# Patient Record
Sex: Female | Born: 1960
Health system: Southern US, Community
[De-identification: ages and names within clinical notes are randomized; demographics above are authoritative.]

## PROBLEM LIST (undated history)

## (undated) DIAGNOSIS — K589 Irritable bowel syndrome without diarrhea: Secondary | ICD-10-CM

## (undated) DIAGNOSIS — M797 Fibromyalgia: Secondary | ICD-10-CM

## (undated) DIAGNOSIS — IMO0002 Reserved for concepts with insufficient information to code with codable children: Secondary | ICD-10-CM

## (undated) DIAGNOSIS — I10 Essential (primary) hypertension: Secondary | ICD-10-CM

## (undated) DIAGNOSIS — M459 Ankylosing spondylitis of unspecified sites in spine: Secondary | ICD-10-CM

## (undated) HISTORY — PX: ABDOMINAL HYSTERECTOMY: SHX81

## (undated) HISTORY — PX: TONSILLECTOMY: SHX5217

## (undated) HISTORY — PX: KNEE SURGERY: SHX244

---

## 2000-10-03 ENCOUNTER — Other Ambulatory Visit: Admission: RE | Admit: 2000-10-03 | Discharge: 2000-10-03 | Payer: Self-pay | Admitting: Family Medicine

## 2000-11-06 ENCOUNTER — Encounter: Payer: Self-pay | Admitting: Family Medicine

## 2000-11-06 ENCOUNTER — Encounter: Admission: RE | Admit: 2000-11-06 | Discharge: 2000-11-06 | Payer: Self-pay | Admitting: Family Medicine

## 2001-04-23 ENCOUNTER — Encounter: Admission: RE | Admit: 2001-04-23 | Discharge: 2001-05-05 | Payer: Self-pay | Admitting: *Deleted

## 2001-10-05 ENCOUNTER — Encounter: Payer: Self-pay | Admitting: Orthopedic Surgery

## 2001-10-05 ENCOUNTER — Encounter: Admission: RE | Admit: 2001-10-05 | Discharge: 2001-10-05 | Payer: Self-pay | Admitting: Orthopedic Surgery

## 2002-04-20 ENCOUNTER — Ambulatory Visit (HOSPITAL_COMMUNITY): Admission: RE | Admit: 2002-04-20 | Discharge: 2002-04-20 | Payer: Self-pay | Admitting: Family Medicine

## 2002-04-20 ENCOUNTER — Encounter: Payer: Self-pay | Admitting: Family Medicine

## 2002-09-27 ENCOUNTER — Encounter: Payer: Self-pay | Admitting: Orthopedic Surgery

## 2002-09-27 ENCOUNTER — Encounter: Admission: RE | Admit: 2002-09-27 | Discharge: 2002-09-27 | Payer: Self-pay | Admitting: Orthopedic Surgery

## 2003-08-03 ENCOUNTER — Encounter
Admission: RE | Admit: 2003-08-03 | Discharge: 2003-11-01 | Payer: Self-pay | Admitting: Physical Medicine and Rehabilitation

## 2003-08-09 ENCOUNTER — Encounter: Admission: RE | Admit: 2003-08-09 | Discharge: 2003-11-07 | Payer: Self-pay

## 2003-09-17 ENCOUNTER — Emergency Department (HOSPITAL_COMMUNITY): Admission: EM | Admit: 2003-09-17 | Discharge: 2003-09-17 | Payer: Self-pay | Admitting: Emergency Medicine

## 2003-12-26 ENCOUNTER — Emergency Department (HOSPITAL_COMMUNITY): Admission: EM | Admit: 2003-12-26 | Discharge: 2003-12-26 | Payer: Self-pay | Admitting: Emergency Medicine

## 2004-01-11 ENCOUNTER — Other Ambulatory Visit: Admission: RE | Admit: 2004-01-11 | Discharge: 2004-01-11 | Payer: Self-pay | Admitting: Family Medicine

## 2004-01-13 ENCOUNTER — Encounter: Admission: RE | Admit: 2004-01-13 | Discharge: 2004-01-13 | Payer: Self-pay | Admitting: Family Medicine

## 2004-01-24 ENCOUNTER — Encounter: Admission: RE | Admit: 2004-01-24 | Discharge: 2004-01-24 | Payer: Self-pay | Admitting: Family Medicine

## 2004-06-02 ENCOUNTER — Emergency Department (HOSPITAL_COMMUNITY): Admission: EM | Admit: 2004-06-02 | Discharge: 2004-06-02 | Payer: Self-pay | Admitting: *Deleted

## 2004-06-14 ENCOUNTER — Encounter (INDEPENDENT_AMBULATORY_CARE_PROVIDER_SITE_OTHER): Payer: Self-pay | Admitting: Specialist

## 2004-06-14 ENCOUNTER — Ambulatory Visit (HOSPITAL_COMMUNITY): Admission: RE | Admit: 2004-06-14 | Discharge: 2004-06-14 | Payer: Self-pay | Admitting: *Deleted

## 2004-08-10 ENCOUNTER — Encounter: Admission: RE | Admit: 2004-08-10 | Discharge: 2004-08-10 | Payer: Self-pay | Admitting: Family Medicine

## 2004-08-29 ENCOUNTER — Encounter: Admission: RE | Admit: 2004-08-29 | Discharge: 2004-08-29 | Payer: Self-pay | Admitting: Family Medicine

## 2004-09-14 ENCOUNTER — Emergency Department (HOSPITAL_COMMUNITY): Admission: EM | Admit: 2004-09-14 | Discharge: 2004-09-14 | Payer: Self-pay | Admitting: Emergency Medicine

## 2005-03-08 ENCOUNTER — Encounter: Admission: RE | Admit: 2005-03-08 | Discharge: 2005-03-08 | Payer: Self-pay | Admitting: Family Medicine

## 2005-08-21 ENCOUNTER — Ambulatory Visit (HOSPITAL_BASED_OUTPATIENT_CLINIC_OR_DEPARTMENT_OTHER): Admission: RE | Admit: 2005-08-21 | Discharge: 2005-08-21 | Payer: Self-pay | Admitting: Orthopedic Surgery

## 2005-10-02 ENCOUNTER — Ambulatory Visit (HOSPITAL_BASED_OUTPATIENT_CLINIC_OR_DEPARTMENT_OTHER): Admission: RE | Admit: 2005-10-02 | Discharge: 2005-10-02 | Payer: Self-pay | Admitting: Orthopedic Surgery

## 2005-10-07 ENCOUNTER — Encounter: Admission: RE | Admit: 2005-10-07 | Discharge: 2005-10-07 | Payer: Self-pay | Admitting: Family Medicine

## 2006-02-11 ENCOUNTER — Encounter: Admission: RE | Admit: 2006-02-11 | Discharge: 2006-02-11 | Payer: Self-pay | Admitting: Family Medicine

## 2006-02-18 ENCOUNTER — Other Ambulatory Visit: Admission: RE | Admit: 2006-02-18 | Discharge: 2006-02-18 | Payer: Self-pay | Admitting: Family Medicine

## 2007-05-06 ENCOUNTER — Encounter: Admission: RE | Admit: 2007-05-06 | Discharge: 2007-05-06 | Payer: Self-pay | Admitting: Family Medicine

## 2007-05-19 ENCOUNTER — Emergency Department (HOSPITAL_COMMUNITY): Admission: EM | Admit: 2007-05-19 | Discharge: 2007-05-19 | Payer: Self-pay | Admitting: Emergency Medicine

## 2007-07-04 ENCOUNTER — Encounter: Admission: RE | Admit: 2007-07-04 | Discharge: 2007-07-04 | Payer: Self-pay | Admitting: Family Medicine

## 2008-01-25 ENCOUNTER — Emergency Department (HOSPITAL_COMMUNITY): Admission: EM | Admit: 2008-01-25 | Discharge: 2008-01-25 | Payer: Self-pay | Admitting: Emergency Medicine

## 2008-09-07 ENCOUNTER — Emergency Department (HOSPITAL_COMMUNITY): Admission: EM | Admit: 2008-09-07 | Discharge: 2008-09-08 | Payer: Self-pay | Admitting: Emergency Medicine

## 2010-02-01 ENCOUNTER — Inpatient Hospital Stay (HOSPITAL_COMMUNITY): Admission: AD | Admit: 2010-02-01 | Discharge: 2010-02-03 | Payer: Self-pay | Admitting: Internal Medicine

## 2010-07-07 ENCOUNTER — Encounter: Payer: Self-pay | Admitting: Family Medicine

## 2010-07-08 ENCOUNTER — Encounter: Payer: Self-pay | Admitting: Family Medicine

## 2010-08-18 ENCOUNTER — Emergency Department (HOSPITAL_COMMUNITY): Payer: Medicare Other

## 2010-08-18 ENCOUNTER — Emergency Department (HOSPITAL_COMMUNITY)
Admission: EM | Admit: 2010-08-18 | Discharge: 2010-08-18 | Disposition: A | Discharge status: Home / Self Care | Payer: Medicare Other | Attending: Emergency Medicine | Admitting: Emergency Medicine

## 2010-08-18 DIAGNOSIS — IMO0001 Reserved for inherently not codable concepts without codable children: Secondary | ICD-10-CM | POA: Insufficient documentation

## 2010-08-18 DIAGNOSIS — M545 Low back pain, unspecified: Secondary | ICD-10-CM | POA: Insufficient documentation

## 2010-08-18 DIAGNOSIS — Z79899 Other long term (current) drug therapy: Secondary | ICD-10-CM | POA: Insufficient documentation

## 2010-08-18 DIAGNOSIS — M79609 Pain in unspecified limb: Secondary | ICD-10-CM | POA: Insufficient documentation

## 2010-08-18 DIAGNOSIS — F411 Generalized anxiety disorder: Secondary | ICD-10-CM | POA: Insufficient documentation

## 2010-08-18 DIAGNOSIS — M533 Sacrococcygeal disorders, not elsewhere classified: Secondary | ICD-10-CM | POA: Insufficient documentation

## 2010-08-18 LAB — URINE MICROSCOPIC-ADD ON

## 2010-08-18 LAB — URINALYSIS, ROUTINE W REFLEX MICROSCOPIC
Bilirubin Urine: NEGATIVE
Ketones, ur: 15 mg/dL — AB
Protein, ur: NEGATIVE mg/dL
Specific Gravity, Urine: 1.025 (ref 1.005–1.030)
Urobilinogen, UA: 0.2 mg/dL (ref 0.0–1.0)

## 2010-08-22 ENCOUNTER — Other Ambulatory Visit: Payer: Self-pay | Admitting: Family Medicine

## 2010-08-22 DIAGNOSIS — M545 Low back pain: Secondary | ICD-10-CM

## 2010-08-23 ENCOUNTER — Ambulatory Visit
Admission: RE | Admit: 2010-08-23 | Discharge: 2010-08-23 | Disposition: A | Discharge status: Home / Self Care | Payer: Medicare Other | Source: Ambulatory Visit | Attending: Family Medicine | Admitting: Family Medicine

## 2010-08-23 DIAGNOSIS — M545 Low back pain: Secondary | ICD-10-CM

## 2010-08-31 LAB — CULTURE, BLOOD (ROUTINE X 2)
Culture: NO GROWTH
Culture: NO GROWTH

## 2010-08-31 LAB — URINALYSIS, ROUTINE W REFLEX MICROSCOPIC
Glucose, UA: NEGATIVE mg/dL
Ketones, ur: NEGATIVE mg/dL
Protein, ur: 30 mg/dL — AB
Urobilinogen, UA: 1 mg/dL (ref 0.0–1.0)

## 2010-08-31 LAB — DIFFERENTIAL
Eosinophils Absolute: 0 10*3/uL (ref 0.0–0.7)
Lymphocytes Relative: 19 % (ref 12–46)
Lymphs Abs: 2.7 10*3/uL (ref 0.7–4.0)
Neutro Abs: 10.8 10*3/uL — ABNORMAL HIGH (ref 1.7–7.7)
Neutrophils Relative %: 75 % (ref 43–77)

## 2010-08-31 LAB — COMPREHENSIVE METABOLIC PANEL
ALT: 27 U/L (ref 0–35)
BUN: 10 mg/dL (ref 6–23)
CO2: 28 mEq/L (ref 19–32)
Calcium: 8.5 mg/dL (ref 8.4–10.5)
Creatinine, Ser: 0.62 mg/dL (ref 0.4–1.2)
GFR calc non Af Amer: >60 mL/min (ref >60)
Glucose, Bld: 153 mg/dL — ABNORMAL HIGH (ref 70–99)

## 2010-08-31 LAB — URINE CULTURE
Culture  Setup Time: 201108190209
Special Requests: NEGATIVE

## 2010-08-31 LAB — CBC
HCT: 37.7 % (ref 36.0–46.0)
Hemoglobin: 13.3 g/dL (ref 12.0–15.0)
MCH: 32.1 pg (ref 26.0–34.0)
MCH: 32.4 pg (ref 26.0–34.0)
MCHC: 34.6 g/dL (ref 30.0–36.0)
MCHC: 35.3 g/dL (ref 30.0–36.0)
MCV: 91.7 fL (ref 78.0–100.0)
Platelets: 423 10*3/uL — ABNORMAL HIGH (ref 150–400)
RBC: 3.6 MIL/uL — ABNORMAL LOW (ref 3.87–5.11)
RDW: 13.4 % (ref 11.5–15.5)

## 2010-08-31 LAB — URINE MICROSCOPIC-ADD ON

## 2010-08-31 LAB — HEMOGLOBIN A1C
Hgb A1c MFr Bld: 5.5 % (ref <5.7)
Mean Plasma Glucose: 111 mg/dL (ref <117)

## 2010-08-31 LAB — MAGNESIUM: Magnesium: 2.2 mg/dL (ref 1.5–2.5)

## 2010-08-31 LAB — PROTIME-INR: Prothrombin Time: 13.7 seconds (ref 11.6–15.2)

## 2010-09-07 ENCOUNTER — Other Ambulatory Visit: Payer: Self-pay | Admitting: Neurosurgery

## 2010-09-07 DIAGNOSIS — M47816 Spondylosis without myelopathy or radiculopathy, lumbar region: Secondary | ICD-10-CM

## 2010-09-10 ENCOUNTER — Other Ambulatory Visit: Payer: Self-pay | Admitting: Neurosurgery

## 2010-09-10 ENCOUNTER — Ambulatory Visit
Admission: RE | Admit: 2010-09-10 | Discharge: 2010-09-10 | Disposition: A | Discharge status: Home / Self Care | Payer: Medicare Other | Source: Ambulatory Visit | Attending: Neurosurgery | Admitting: Neurosurgery

## 2010-09-10 DIAGNOSIS — M47816 Spondylosis without myelopathy or radiculopathy, lumbar region: Secondary | ICD-10-CM

## 2010-09-22 ENCOUNTER — Emergency Department (HOSPITAL_COMMUNITY)
Admission: EM | Admit: 2010-09-22 | Discharge: 2010-09-22 | Disposition: A | Discharge status: Home / Self Care | Payer: Medicare Other | Attending: Emergency Medicine | Admitting: Emergency Medicine

## 2010-09-22 DIAGNOSIS — F411 Generalized anxiety disorder: Secondary | ICD-10-CM | POA: Insufficient documentation

## 2010-09-22 DIAGNOSIS — Z87442 Personal history of urinary calculi: Secondary | ICD-10-CM | POA: Insufficient documentation

## 2010-09-22 DIAGNOSIS — R300 Dysuria: Secondary | ICD-10-CM | POA: Insufficient documentation

## 2010-09-22 DIAGNOSIS — R509 Fever, unspecified: Secondary | ICD-10-CM | POA: Insufficient documentation

## 2010-09-22 DIAGNOSIS — R112 Nausea with vomiting, unspecified: Secondary | ICD-10-CM | POA: Insufficient documentation

## 2010-09-22 DIAGNOSIS — IMO0001 Reserved for inherently not codable concepts without codable children: Secondary | ICD-10-CM | POA: Insufficient documentation

## 2010-09-22 DIAGNOSIS — N12 Tubulo-interstitial nephritis, not specified as acute or chronic: Secondary | ICD-10-CM | POA: Insufficient documentation

## 2010-09-22 DIAGNOSIS — R109 Unspecified abdominal pain: Secondary | ICD-10-CM | POA: Insufficient documentation

## 2010-09-22 LAB — URINALYSIS, ROUTINE W REFLEX MICROSCOPIC
Nitrite: POSITIVE — AB
Protein, ur: 100 mg/dL — AB

## 2010-09-22 LAB — CBC
HCT: 41.9 % (ref 36.0–46.0)
Hemoglobin: 14.1 g/dL (ref 12.0–15.0)
MCH: 30.8 pg (ref 26.0–34.0)
MCHC: 33.7 g/dL (ref 30.0–36.0)

## 2010-09-22 LAB — COMPREHENSIVE METABOLIC PANEL
ALT: 17 U/L (ref 0–35)
CO2: 29 mEq/L (ref 19–32)
Calcium: 9.5 mg/dL (ref 8.4–10.5)
Creatinine, Ser: 0.89 mg/dL (ref 0.4–1.2)
GFR calc non Af Amer: >60 mL/min (ref >60)
Glucose, Bld: 117 mg/dL — ABNORMAL HIGH (ref 70–99)
Sodium: 137 mEq/L (ref 135–145)

## 2010-09-22 LAB — DIFFERENTIAL
Lymphocytes Relative: 12 % (ref 12–46)
Monocytes Absolute: 1.6 10*3/uL — ABNORMAL HIGH (ref 0.1–1.0)
Monocytes Relative: 10 % (ref 3–12)
Neutro Abs: 12.4 10*3/uL — ABNORMAL HIGH (ref 1.7–7.7)

## 2010-09-24 LAB — URINE CULTURE: Colony Count: >=100000

## 2010-09-27 LAB — DIFFERENTIAL
Basophils Relative: 0 % (ref 0–1)
Eosinophils Absolute: 0 10*3/uL (ref 0.0–0.7)
Monocytes Absolute: 0.3 10*3/uL (ref 0.1–1.0)
Monocytes Relative: 2 % — ABNORMAL LOW (ref 3–12)
Neutrophils Relative %: 88 % — ABNORMAL HIGH (ref 43–77)

## 2010-09-27 LAB — URINALYSIS, ROUTINE W REFLEX MICROSCOPIC
Bilirubin Urine: NEGATIVE
Glucose, UA: NEGATIVE mg/dL
Ketones, ur: 15 mg/dL — AB
Protein, ur: 30 mg/dL — AB

## 2010-09-27 LAB — COMPREHENSIVE METABOLIC PANEL
ALT: 15 U/L (ref 0–35)
Alkaline Phosphatase: 70 U/L (ref 39–117)
Glucose, Bld: 147 mg/dL — ABNORMAL HIGH (ref 70–99)
Potassium: 3.5 mEq/L (ref 3.5–5.1)
Sodium: 138 mEq/L (ref 135–145)
Total Protein: 7.4 g/dL (ref 6.0–8.3)

## 2010-09-27 LAB — CBC
Hemoglobin: 15.2 g/dL — ABNORMAL HIGH (ref 12.0–15.0)
RBC: 4.8 MIL/uL (ref 3.87–5.11)
RDW: 12.7 % (ref 11.5–15.5)
WBC: 13.4 10*3/uL — ABNORMAL HIGH (ref 4.0–10.5)

## 2010-09-27 LAB — URINE MICROSCOPIC-ADD ON

## 2010-11-02 NOTE — Op Note (Signed)
Belinda Calderon, Belinda Calderon               ACCOUNT NO.:  [id number]   MEDICAL RECORD NO.:  [id number]          PATIENT TYPE:  AMB   LOCATION:  DSC                          FACILITY:  [facility]   PHYSICIAN:  Cindee Salt, M.D.       DATE OF BIRTH:  [birth year]   DATE OF PROCEDURE:  10/02/2005  DATE OF DISCHARGE:                                 OPERATIVE REPORT   PREOPERATIVE DIAGNOSIS:  Carpal tunnel syndrome, right hand.   POSTOPERATIVE DIAGNOSIS:  Carpal tunnel syndrome, right hand.   OPERATION:  Decompression right median nerve.   SURGEON:  Cindee Salt, M.D.   ASSISTANT:  Carolyne Fiscal R.N.   ANESTHESIA:  General.   HISTORY:  The patient is a 50 year old female with a history of carpal  tunnel syndrome, EMG nerve conductions positive, which has not responded to  conservative treatment.  She is desirous for release having undergone  release on the left side.  She is now admitted for release on the right. The  area was marked in the preoperative area by the patient and surgeon.  Questions answered.   PROCEDURE:  The patient is brought to the operating room where a general  anesthetic was carried out without difficulty.  She was prepped using  DuraPrep, supine position, right arm free.  A longitudinal incision was made  in the palm and carried down through subcutaneous tissue.  Bleeders were  electrocauterized.  The palmar fascia was split, superficial palmar arch  identified. The flexor tendon to the ring and little finger were identified.  To the ulnar side of the median nerve, the carpal retinaculum was incised  with sharp dissection.  Right angle and Sewall retractor placed between the  skin and forearm fascia.  The fascia was released for approximately 1.5 cm  proximal to the wrist crease under direct vision.  The canal was explored.  No further lesions were identified.  The area of compression of the nerve  was apparent.  Tenosynovium tissue was moderately thickened.  The wound was  irrigated.   The skin was closed with interrupted 5-0 nylon sutures.  Sterile  compressive dressing and splint was applied.  The patient tolerated the  procedure well was taken to the recovery room for observation in  satisfactory condition.  She is discharged home to return to the [facility]  in [place] in one week on Vicodin.           ______________________________  Cindee Salt, M.D.     GK/MEDQ  D:  10/02/2005  T:  10/02/2005  Job:  [id number]

## 2010-11-02 NOTE — Group Therapy Note (Signed)
CHIEF COMPLAINT:  I have fibromyalgia and ankylosing spondylitis.   HISTORY OF PRESENT ILLNESS:  This is a 50 year old woman who has a long  history of multiple pain complaints, who has been diagnosed with  fibromyalgia.  She has recently been worked up by rheumatologist, Dr. Pollyann Savoy, and she was found to be positive for HLAB-27, however, an MRI of  her pelvis showed no sclerosis, erosions or subchondral edema.  She has also  had some blood work and shows she has elevated triglycerides and thyroid  functions are normal.  CBC is essentially normal.  She has had multiple  injections into her hips and back and elbow area and has received various  medications for treatment of her fibromyalgia including Neurontin, Zoloft,  Ultram, Paxil, Lorcet, Toradol, Voltaren and Effexor.   She has also had some left elbow x-rays done, May 2002, which showed no  joint space narrowing.  She has had an AP x-ray of her pelvis, September  2001; again, that showed no joint space narrowing.  She had an MRI of her  right knee which showed a lateral meniscal tear.   She is currently followed at mental health by Dr. Mila Homer and she last saw her  back in January.   She basically has pain, especially at her elbows, her mid-back, low-back  area and legs.  Essentially, all sorts of activities aggravate her pain,  including walking, bending, sitting, working; therapy in the past  exacerbated her pain.  Her pain is typically improved with heat and  medication.  She describes her pain; her average pain is about an 8 on a  scale of 10, the least pain she has is about a 4 and the worst is about a 10  on a scale of 10.   FUNCTIONAL STATUS:  She is currently independent with self-care and  mobility.   REVIEW OF SYSTEMS:  Review of systems is positive for numbness and spasms,  nausea, vomiting, diarrhea, constipation, poor appetite.  She reports  fevers, chills, weight loss and excessive sweating and  bruising.   SOCIAL HISTORY:  The patient is married.  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  She also enjoys Visual merchandiser.  She has  filed for disability.  She occasionally drinks alcohol, denies tobacco or  illicit drug use.  She is currently home-schooling one of her children, who  is 50.  She has older children, 25 and a 34 year old.   FAMILY HISTORY:  Family history is remarkable for mother at age 14 with  diabetes, hypertension, rheumatoid arthritis and osteoporosis; father age 94  with diabetes, hypertension.  She has siblings who have rheumatoid arthritis  as well as multiple sclerosis.   ALLERGIES:  None that she knows of.   CURRENT MEDICATIONS:  1. Prozac 40 mg daily.  2. Enalapril/hydrochlorothiazide 10/25 mg 1 daily.  3. Clonazepam 1 mg p.r.n.  4. Wellbutrin 300 mg daily.  5. Hydrocodone 7.5 mg 2 q.6 h.   PHYSICAL EXAMINATION:  GENERAL:  On exam, she was noted to be a well-  developed, well-nourished woman in no apparent distress.  She was  appropriate and cooperative.  SKIN:  Unremarkable.  HEENT:  Unremarkable.  NECK:  Neck was supple without any lymphadenopathy.  HEART:  Heart was regular rhythm.  LUNGS:  Lungs were clear.  ABDOMEN:  Abdomen was benign.  NEUROMUSCULAR:  Her gait in the room was somewhat slow, not obviously  antalgic.  She was able to flex forward approximately 40 degrees.  Extension  was 10.  Lateral flexion was 10 degrees bilaterally.  Seated, reflexes were  2+ in the biceps, triceps, brachioradialis; 2+ at the patellar tendons and  Achilles tendons bilaterally.  Toes are downgoing.  No clonus was noted.  Sensation was intact in the upper and lower extremities without any obvious  diminishment in any of the dermatomes.  Motor strength was 5/5 in upper and  lower extremities.   Tenderness to palpation in multiple areas interscapularly and in the lower  lumbar area as well as exquisitely at both trochanters.    IMPRESSION:  1. Fibromyalgia.  2. Positive HLAB-27.   PLAN:  The patient was given a prescription for Mobic 7.5 mg 1 p.o. daily.  She was also given Lidoderm 5% patch to apply to affected areas, 12 hours  on, 12 hours off, #30, and a prescription was written for physical therapy 2  times a week for 4 weeks for them to educate the patient in proper body  mechanics, spine stabilization, especially extension exercises and home  program.  We would like her to be able to do a good independent home  program, maybe transition her to a [facility] for further therapy.  We will see  her back in 1 month.  We will try to avoid opioids with her and emphasize  importance of activity.     Brantley Stage, M.D.   DMK/MedQ  D:  08/05/2003 17:43:57  T:  08/06/2003 05:22:12  Job #:  [id number]   cc:   Stacie Acres. White, M.D.  [address]  [place]  [place] [address]  Fax: [phone]

## 2010-11-02 NOTE — Op Note (Signed)
Belinda Calderon, Belinda Calderon               ACCOUNT NO.:  [id number]   MEDICAL RECORD NO.:  [id number]          PATIENT TYPE:  AMB   LOCATION:  DSC                          FACILITY:  [facility]   PHYSICIAN:  Cindee Salt, M.D.       DATE OF BIRTH:  [birth year]   DATE OF PROCEDURE:  08/21/2005  DATE OF DISCHARGE:                                 OPERATIVE REPORT   PREOPERATIVE DIAGNOSIS:  Carpal tunnel syndrome left hand.   POSTOPERATIVE DIAGNOSIS:  Carpal tunnel syndrome left hand.   OPERATION:  Decompression left median nerve.   SURGEON:  Cindee Salt, M.D.   ANESTHESIA:  General.   HISTORY:  The patient is a 50 year old female with history of carpal tunnel  syndrome.  EMG nerve conductions are positive which has not responded to  conservative treatment.   PROCEDURE:  The patient is brought to the operating room where a general  anesthetic was carried out without difficulty.  She was prepped using  DuraPrep, supine position with left arm free.  A longitudinal incision was  made in the palm and carried down through the subcutaneous tissue.  Bleeders  were electrocauterized.  The palmar fascia was split, superficial palmar  arch identified, the flexor tendon of the ring and little finger identified.  To the ulnar side of median nerve, the carpal retinaculum was incised with  sharp dissection.  A right angle and Sewall retractor placed between skin  and forearm fascia.  The fascia was then released under direct vision for  approximately 1.5 proximal to the wrist crease.  The area of compression to  the nerve was apparent.  No further lesions were identified.  The wound was  irrigated.  The skin was closed with interrupted 5-0 nylon sutures.  A  sterile compressive dressing and splint was applied.  The patient tolerated  the procedure well was taken to the recovery observation in satisfactory  condition.  She is discharged home to return to the [facility]  [place] in one week on  Vicodin.           ______________________________  Cindee Salt, M.D.     GK/MEDQ  D:  08/21/2005  T:  08/21/2005  Job:  [id number]

## 2010-12-14 ENCOUNTER — Emergency Department (HOSPITAL_COMMUNITY)
Admission: EM | Admit: 2010-12-14 | Discharge: 2010-12-14 | Disposition: A | Discharge status: Home / Self Care | Payer: Medicare Other | Attending: Emergency Medicine | Admitting: Emergency Medicine

## 2010-12-14 ENCOUNTER — Emergency Department (HOSPITAL_COMMUNITY): Payer: Medicare Other

## 2010-12-14 DIAGNOSIS — M79609 Pain in unspecified limb: Secondary | ICD-10-CM | POA: Insufficient documentation

## 2010-12-14 DIAGNOSIS — R079 Chest pain, unspecified: Secondary | ICD-10-CM | POA: Insufficient documentation

## 2010-12-14 DIAGNOSIS — IMO0001 Reserved for inherently not codable concepts without codable children: Secondary | ICD-10-CM | POA: Insufficient documentation

## 2010-12-14 DIAGNOSIS — M545 Low back pain, unspecified: Secondary | ICD-10-CM | POA: Insufficient documentation

## 2010-12-14 DIAGNOSIS — K219 Gastro-esophageal reflux disease without esophagitis: Secondary | ICD-10-CM | POA: Insufficient documentation

## 2010-12-14 LAB — CBC
MCH: 31.3 pg (ref 26.0–34.0)
MCHC: 35.4 g/dL (ref 30.0–36.0)
Platelets: 263 10*3/uL (ref 150–400)
RDW: 12.6 % (ref 11.5–15.5)

## 2010-12-14 LAB — BASIC METABOLIC PANEL
Chloride: 106 mEq/L (ref 96–112)
Creatinine, Ser: 0.57 mg/dL (ref 0.50–1.10)
GFR calc Af Amer: >60 mL/min (ref >60)
GFR calc non Af Amer: >60 mL/min (ref >60)
Potassium: 3.3 mEq/L — ABNORMAL LOW (ref 3.5–5.1)

## 2010-12-14 LAB — CK TOTAL AND CKMB (NOT AT ARMC)
CK, MB: 1.8 ng/mL (ref 0.3–4.0)
Relative Index: INVALID (ref 0.0–2.5)
Relative Index: INVALID (ref 0.0–2.5)
Total CK: 69 U/L (ref 7–177)

## 2010-12-14 LAB — URINALYSIS, ROUTINE W REFLEX MICROSCOPIC
Ketones, ur: NEGATIVE mg/dL
Leukocytes, UA: NEGATIVE
Nitrite: NEGATIVE
Protein, ur: NEGATIVE mg/dL

## 2010-12-14 LAB — DIFFERENTIAL
Basophils Relative: 0 % (ref 0–1)
Eosinophils Absolute: 0.1 10*3/uL (ref 0.0–0.7)
Eosinophils Relative: 0 % (ref 0–5)
Monocytes Absolute: 0.6 10*3/uL (ref 0.1–1.0)
Monocytes Relative: 4 % (ref 3–12)
Neutrophils Relative %: 80 % — ABNORMAL HIGH (ref 43–77)

## 2010-12-14 LAB — TROPONIN I
Troponin I: <0.3 ng/mL (ref <0.30)
Troponin I: <0.3 ng/mL (ref <0.30)

## 2011-03-15 LAB — POCT I-STAT, CHEM 8
BUN: 28 — ABNORMAL HIGH
Calcium, Ion: 1.1 — ABNORMAL LOW
HCT: 54 — ABNORMAL HIGH
Hemoglobin: 18.4 — ABNORMAL HIGH
Sodium: 137
TCO2: 22

## 2011-03-15 LAB — CLOSTRIDIUM DIFFICILE EIA

## 2011-03-15 LAB — CBC
HCT: 52.6 — ABNORMAL HIGH
MCHC: 34.1
Platelets: 346
RDW: 12.9

## 2011-03-15 LAB — OVA AND PARASITE EXAMINATION

## 2011-03-15 LAB — DIFFERENTIAL
Basophils Absolute: 0
Basophils Relative: 0
Eosinophils Absolute: 0
Eosinophils Relative: 0
Lymphocytes Relative: 15
Monocytes Absolute: 1

## 2011-03-15 LAB — STOOL CULTURE

## 2011-03-19 ENCOUNTER — Other Ambulatory Visit: Payer: Self-pay | Admitting: Family Medicine

## 2011-03-19 DIAGNOSIS — Z1231 Encounter for screening mammogram for malignant neoplasm of breast: Secondary | ICD-10-CM

## 2011-03-25 LAB — POCT I-STAT CREATININE
Creatinine, Ser: 0.8
Operator id: 234501

## 2011-03-25 LAB — I-STAT 8, (EC8 V) (CONVERTED LAB)
Acid-base deficit: 1
Bicarbonate: 26 — ABNORMAL HIGH
Chloride: 106
pCO2, Ven: 50.7 — ABNORMAL HIGH
pH, Ven: 7.318 — ABNORMAL HIGH

## 2011-03-25 LAB — ETHANOL: Alcohol, Ethyl (B): <5

## 2011-03-26 ENCOUNTER — Ambulatory Visit: Payer: Medicare Other

## 2011-04-04 ENCOUNTER — Ambulatory Visit: Payer: Medicare Other

## 2011-04-05 ENCOUNTER — Other Ambulatory Visit: Payer: Self-pay | Admitting: Neurosurgery

## 2011-04-05 DIAGNOSIS — M47816 Spondylosis without myelopathy or radiculopathy, lumbar region: Secondary | ICD-10-CM

## 2011-04-15 ENCOUNTER — Other Ambulatory Visit: Payer: Self-pay | Admitting: Neurosurgery

## 2011-04-15 ENCOUNTER — Ambulatory Visit
Admission: RE | Admit: 2011-04-15 | Discharge: 2011-04-15 | Disposition: A | Discharge status: Home / Self Care | Payer: Medicare Other | Source: Ambulatory Visit | Attending: Neurosurgery | Admitting: Neurosurgery

## 2011-04-15 DIAGNOSIS — M47816 Spondylosis without myelopathy or radiculopathy, lumbar region: Secondary | ICD-10-CM

## 2011-04-15 MED ORDER — METHYLPREDNISOLONE ACETATE 40 MG/ML INJ SUSP (RADIOLOG
120.0000 mg | Freq: Once | INTRAMUSCULAR | Status: AC
  Administered 2011-04-15: 120 mg via INTRA_ARTICULAR

## 2011-04-15 MED ORDER — IOHEXOL 180 MG/ML  SOLN
1.0000 mL | Freq: Once | INTRAMUSCULAR | Status: AC | PRN
  Administered 2011-04-15: 1 mL via INTRA_ARTICULAR

## 2011-04-15 MED ORDER — DIAZEPAM 5 MG PO TABS
5.0000 mg | ORAL_TABLET | Freq: Once | ORAL | Status: AC
  Administered 2011-04-15: 5 mg via ORAL

## 2011-09-09 ENCOUNTER — Encounter (HOSPITAL_COMMUNITY): Payer: Self-pay | Admitting: Emergency Medicine

## 2011-09-09 ENCOUNTER — Emergency Department (HOSPITAL_COMMUNITY)
Admission: EM | Admit: 2011-09-09 | Discharge: 2011-09-09 | Disposition: A | Discharge status: Home Health | Payer: Medicare Other | Attending: Emergency Medicine | Admitting: Emergency Medicine

## 2011-09-09 DIAGNOSIS — L2989 Other pruritus: Secondary | ICD-10-CM | POA: Insufficient documentation

## 2011-09-09 DIAGNOSIS — Y92009 Unspecified place in unspecified non-institutional (private) residence as the place of occurrence of the external cause: Secondary | ICD-10-CM | POA: Insufficient documentation

## 2011-09-09 DIAGNOSIS — M459 Ankylosing spondylitis of unspecified sites in spine: Secondary | ICD-10-CM | POA: Insufficient documentation

## 2011-09-09 DIAGNOSIS — M1710 Unilateral primary osteoarthritis, unspecified knee: Secondary | ICD-10-CM | POA: Insufficient documentation

## 2011-09-09 DIAGNOSIS — T63391A Toxic effect of venom of other spider, accidental (unintentional), initial encounter: Secondary | ICD-10-CM | POA: Insufficient documentation

## 2011-09-09 DIAGNOSIS — I10 Essential (primary) hypertension: Secondary | ICD-10-CM | POA: Insufficient documentation

## 2011-09-09 DIAGNOSIS — M797 Fibromyalgia: Secondary | ICD-10-CM | POA: Insufficient documentation

## 2011-09-09 DIAGNOSIS — K589 Irritable bowel syndrome without diarrhea: Secondary | ICD-10-CM | POA: Insufficient documentation

## 2011-09-09 DIAGNOSIS — L298 Other pruritus: Secondary | ICD-10-CM | POA: Insufficient documentation

## 2011-09-09 DIAGNOSIS — T6391XA Toxic effect of contact with unspecified venomous animal, accidental (unintentional), initial encounter: Secondary | ICD-10-CM | POA: Insufficient documentation

## 2011-09-09 DIAGNOSIS — R21 Rash and other nonspecific skin eruption: Secondary | ICD-10-CM | POA: Insufficient documentation

## 2011-09-09 DIAGNOSIS — T63301A Toxic effect of unspecified spider venom, accidental (unintentional), initial encounter: Secondary | ICD-10-CM

## 2011-09-09 HISTORY — DX: Essential (primary) hypertension: I10

## 2011-09-09 HISTORY — DX: Ankylosing spondylitis of unspecified sites in spine: M45.9

## 2011-09-09 HISTORY — DX: Irritable bowel syndrome, unspecified: K58.9

## 2011-09-09 HISTORY — DX: Reserved for concepts with insufficient information to code with codable children: IMO0002

## 2011-09-09 HISTORY — DX: Fibromyalgia: M79.7

## 2011-09-09 LAB — CBC
HCT: 40.4 % (ref 36.0–46.0)
MCH: 31.1 pg (ref 26.0–34.0)
MCV: 91 fL (ref 78.0–100.0)
RDW: 13.3 % (ref 11.5–15.5)
WBC: 12.7 10*3/uL — ABNORMAL HIGH (ref 4.0–10.5)

## 2011-09-09 LAB — BASIC METABOLIC PANEL
CO2: 27 mEq/L (ref 19–32)
Calcium: 9.1 mg/dL (ref 8.4–10.5)
Creatinine, Ser: 0.75 mg/dL (ref 0.50–1.10)
Glucose, Bld: 114 mg/dL — ABNORMAL HIGH (ref 70–99)

## 2011-09-09 LAB — DIFFERENTIAL
Basophils Absolute: 0 10*3/uL (ref 0.0–0.1)
Eosinophils Relative: 8 % — ABNORMAL HIGH (ref 0–5)
Lymphocytes Relative: 21 % (ref 12–46)
Lymphs Abs: 2.7 10*3/uL (ref 0.7–4.0)
Monocytes Absolute: 0.9 10*3/uL (ref 0.1–1.0)

## 2011-09-09 MED ORDER — BENADRYL 25 MG PO TABS: 25.0000 mg | ORAL_TABLET | Freq: Four times a day (QID) | ORAL | Status: AC | PRN

## 2011-09-09 MED ORDER — CLINDAMYCIN PHOSPHATE 600 MG/50ML IV SOLN
600.0000 mg | Freq: Three times a day (TID) | INTRAVENOUS | Status: DC
  Administered 2011-09-09: 600 mg via INTRAVENOUS
  Filled 2011-09-09: qty 50

## 2011-09-09 MED ORDER — PREDNISONE 10 MG PO TABS: 20.0000 mg | ORAL_TABLET | Freq: Every day | ORAL | Status: DC

## 2011-09-09 MED ORDER — FAMOTIDINE IN NACL 20-0.9 MG/50ML-% IV SOLN: 20.0000 mg | INTRAVENOUS | Status: DC

## 2011-09-09 MED ORDER — DIPHENHYDRAMINE HCL 25 MG PO CAPS
50.0000 mg | ORAL_CAPSULE | Freq: Once | ORAL | Status: AC
  Administered 2011-09-09: 50 mg via ORAL
  Filled 2011-09-09: qty 2

## 2011-09-09 MED ORDER — FAMOTIDINE 20 MG PO TABS
20.0000 mg | ORAL_TABLET | Freq: Once | ORAL | Status: AC
  Administered 2011-09-09: 20 mg via ORAL
  Filled 2011-09-09: qty 1

## 2011-09-09 MED ORDER — PREDNISONE 20 MG PO TABS
60.0000 mg | ORAL_TABLET | Freq: Once | ORAL | Status: AC
  Administered 2011-09-09: 60 mg via ORAL
  Filled 2011-09-09: qty 3

## 2011-09-09 MED ORDER — CLINDAMYCIN HCL 150 MG PO CAPS: 300.0000 mg | ORAL_CAPSULE | Freq: Four times a day (QID) | ORAL | Status: AC

## 2011-09-09 MED ORDER — PEPCID 20 MG PO TABS: 20.0000 mg | ORAL_TABLET | Freq: Two times a day (BID) | ORAL | Status: AC

## 2011-09-09 MED ORDER — SODIUM CHLORIDE 0.9 % IV SOLN
1000.0000 mL | Freq: Once | INTRAVENOUS | Status: AC
  Administered 2011-09-09: 1000 mL via INTRAVENOUS

## 2011-09-09 MED ORDER — TETANUS-DIPHTHERIA TOXOIDS TD 5-2 LFU IM INJ
0.5000 mL | INJECTION | Freq: Once | INTRAMUSCULAR | Status: AC
  Administered 2011-09-09: 0.5 mL via INTRAMUSCULAR
  Filled 2011-09-09: qty 0.5

## 2011-09-09 NOTE — ED Notes (Signed)
Pt c/o abscess and possible spider bite to right side of stomach; pt noted to have some bruising around with redness; pt c/o some red flushing

## 2011-09-09 NOTE — ED Provider Notes (Signed)
History     CSN: [id number]  Arrival date & time 09/09/11  1706   First MD Initiated Contact with Patient 09/09/11 2027      Chief Complaint  Patient presents with  . Abscess  . Insect Bite    (Consider location/radiation/quality/duration/timing/severity/associated sxs/prior treatment) Patient is a 51 y.o. female presenting with abscess. The history is provided by the patient. No language interpreter was used.  Abscess  This is a new problem. Episode onset: 2 days. The onset was gradual. The problem occurs continuously. The abscess is present on the torso, left lower leg, right lower leg, trunk and back. The problem is moderate. The abscess is characterized by itchiness and redness. The patient was exposed to spider bites. The abscess first occurred at home. Pertinent negatives include no anorexia, no fever, no diarrhea, no vomiting, no sore throat, no decreased responsiveness and no cough.  Spider bite x 2 days to RLQ abdomen with bruising and tenderness.  Fine red raised rash to trunk and upper extremities that itches.  Went to urgent care 2 days ago and was treated with a "shot" of steroids and benadryl x 2 doses.  Rash worse today.  Spider bite worse today.  pmh of fibromyalgia, hypertension and Ibs. Daughter at bedside. Needs tetanus.   Past Medical History  Diagnosis Date  . Hypertension   . Fibromyalgia   . Ankylosing spondylitis   . IBS (irritable bowel syndrome)   . Degenerative disc disease     History reviewed. No pertinent past surgical history.  History reviewed. No pertinent family history.  History  Substance Use Topics  . Smoking status: Never Smoker   . Smokeless tobacco: Not on file  . Alcohol Use: No    OB History    Grav Para Term Preterm Abortions TAB SAB Ect Mult Living                  Review of Systems  Unable to perform ROS Constitutional: Negative.  Negative for fever, chills and decreased responsiveness.  HENT: Negative.  Negative for sore  throat.   Eyes: Negative.   Respiratory: Negative.  Negative for cough.   Cardiovascular: Negative.   Gastrointestinal: Negative.  Negative for vomiting, diarrhea and anorexia.  Skin: Positive for rash.       Fine red rash to trunk and upper extremities. Spider bite to RLabdomen  Neurological: Negative.   Psychiatric/Behavioral: Negative.   All other systems reviewed and are negative.    Allergies  Sulfa antibiotics  Home Medications   Current Outpatient Rx  Name Route Sig Dispense Refill  . AMLODIPINE BESYLATE 5 MG PO TABS Oral Take 5 mg by mouth daily.    Marland Kitchen CLONAZEPAM 1 MG PO TABS Oral Take 1 mg by mouth daily as needed. For anxiety    . HYDROCODONE-ACETAMINOPHEN 10-325 MG PO TABS Oral Take 3 tablets by mouth 2 (two) times daily as needed. For pain    . LISINOPRIL 20 MG PO TABS Oral Take 20 mg by mouth daily.    Marland Kitchen LOPERAMIDE HCL 2 MG PO CAPS Oral Take 2 mg by mouth 4 (four) times daily as needed. For diarrhea    . PROMETHAZINE HCL 25 MG PO TABS Oral Take 25 mg by mouth every 6 (six) hours as needed. For nausea      BP 130/91  Pulse 83  Temp(Src) 97.7 F (36.5 C) (Oral)  Resp 16  SpO2 97%  Physical Exam  Nursing note and vitals reviewed. Constitutional: She  is oriented to person, place, and time. She appears well-developed and well-nourished.  HENT:  Head: Normocephalic.  Eyes: Conjunctivae and EOM are normal. Pupils are equal, round, and reactive to light.  Neck: Normal range of motion. Neck supple.  Cardiovascular: Normal rate.   Pulmonary/Chest: Effort normal.  Abdominal: Soft.  Musculoskeletal: Normal range of motion.  Neurological: She is alert and oriented to person, place, and time.  Skin: Skin is warm and dry. Rash noted.       RLQ abdomen cellulitis 8cm x 15cm outlined. No area of fluctuance/abscess.  Rash over trunk and upper extremities. Spider bite with bruising to R L Abdomen.  Cellulitis outlined.   Psychiatric: She has a normal mood and affect.     ED Course  Procedures (including critical care time)   Labs Reviewed  CBC  DIFFERENTIAL  BASIC METABOLIC PANEL   No results found.   No diagnosis found.    MDM  Spider bite 2 days ago to RLQ abdomen with rash noted to trunk and upper extremities treated with clindamycin 600mg  IV, benadryl, prednisone and pepcid in the ER with relief.  No fever no abscess.  Will treat outpatient with clindamycin 600mg  tid. Go to [facility] to get rx filled.  Return tomorrow for recheck here if worse.  Urgent care if better.  Continue benadryl, prednisone and pedcid as directed.  Return if worse nausea vomiting, neuro symptoms.          Jethro Bastos, NP 09/10/11 1123

## 2011-09-09 NOTE — Discharge Instructions (Signed)
Belinda Calderon watch the area of the spider bite if it gets  bigger or you run a fever come back to the ER.   Start the antibiotics in the morning. Get the clindamycin filled at [facility] where it's cheaper. We gave you a dose of IV clindamycin in the ER today also gave you a tetanus shot. Take the Benadryl 25mg  (every 4-6 hours) , prednisone and Pepcid for your rash for at least 3-4 more days.  Return to the ER if you have severe pain high fever nausea vomiting or neurological symptoms.  Brown Recluse Spider Bite A brown recluse spider may be dark brown to light tan in color. It has a band of darker color shaped like a violin on its back. The whole spider (with legs) may grow to the size of 1 inch (2.5 cm). These spiders live undercover, outdoors, and in out-of-the-way places indoors. They can be found in the [place] on the [address], [place], and mostly in the [place]. Brown recluse spider bites can be serious and life-threatening. SYMPTOMS  Symptoms can get worse over several days and may include:  Pain at the bite site. This may begin as a small, painful blister with redness around it. The pain and sore (lesion) caused by the bite can increase and spread over time. This may result in an area of tissue death up to 12 inches (30 cm) wide.   General feeling of illness (malaise).   Nausea and vomiting.   Fever.   Body aches.  TREATMENT  Your caregiver may prescribe a drug to prevent tissue death or to treat your lesion. If a large lesion develops, surgery may be needed to remove the damaged tissue. Certain medicines and antibiotics may be prescribed depending on the severity of your illness. However, there is no single antidote to treat this bite. Treatment focuses on caring for your wound. HOME CARE INSTRUCTIONS   Do not scratch the bite area. Keep the area clean and covered with an adhesive bandage or sterile gauze bandage.   Wash the area daily in warm, soapy water.   Put ice or cool compresses on  the bite area.   Put ice in a plastic bag.   Place a towel between your skin and the bag.   Leave the ice on for 20 to 30 minutes, 3 to 4 times a day or as directed.   Keep the bite area elevated above the level of your heart. This helps reduce swelling.   Take medicines as directed by your caregiver.  You may need a tetanus shot if:  You cannot remember when you had your last tetanus shot.   You have never had a tetanus shot.   The injury broke your skin.  If you get a tetanus shot, your arm may swell, get red, and feel warm to the touch. This is common and not a problem. If you need a tetanus shot and you choose not to have one, there is a rare chance of getting tetanus. Sickness from tetanus can be serious. SEEK MEDICAL CARE IF:   Your symptoms do not improve in 24 hours or are getting worse.   You have increasing pain in the bite area.  SEEK IMMEDIATE MEDICAL CARE IF:   Your lesion appears to be getting larger (more than  inch [5 mm]), growing deeper, or looks infected.   You have chills or a fever.   You feel nauseous, vomit, have muscle aches, weakness, extreme tiredness, convulsions, or a  red rash.   Your urine output decreases.   You have blood in your urine or notice other unusual bleeding.   Your skin turns yellow.  MAKE SURE YOU:   Understand these instructions.   Will watch your condition.   Will get help right away if you are not doing well or get worse.  Document Released: 06/03/2005 Document Revised: 05/23/2011 Document Reviewed: 01/02/2011 [facility] Patient Information 2012 [place], [place].Brown Recluse Spider Bite A brown recluse spider may be dark brown to light tan in color. It has a band of darker color shaped like a violin on its back. The whole spider (with legs) may grow to the size of 1 inch (2.5 cm). These spiders live undercover, outdoors, and in out-of-the-way places indoors. They can be found in the [place] the [address], [place], and  mostly in the [place]. Brown recluse spider bites can be serious and life-threatening. SYMPTOMS  Symptoms can get worse over several days and may include:  Pain at the bite site. This may begin as a small, painful blister with redness around it. The pain and sore (lesion) caused by the bite can increase and spread over time. This may result in an area of tissue death up to 12 inches (30 cm) wide.   General feeling of illness (malaise).   Nausea and vomiting.   Fever.   Body aches.  TREATMENT  Your caregiver may prescribe a drug to prevent tissue death or to treat your lesion. If a large lesion develops, surgery may be needed to remove the damaged tissue. Certain medicines and antibiotics may be prescribed depending on the severity of your illness. However, there is no single antidote to treat this bite. Treatment focuses on caring for your wound. HOME CARE INSTRUCTIONS   Do not scratch the bite area. Keep the area clean and covered with an adhesive bandage or sterile gauze bandage.   Wash the area daily in warm, soapy water.   Put ice or cool compresses on the bite area.   Put ice in a plastic bag.   Place a towel between your skin and the bag.   Leave the ice on for 20 to 30 minutes, 3 to 4 times a day or as directed.   Keep the bite area elevated above the level of your heart. This helps reduce swelling.   Take medicines as directed by your caregiver.  You may need a tetanus shot if:  You cannot remember when you had your last tetanus shot.   You have never had a tetanus shot.   The injury broke your skin.  If you get a tetanus shot, your arm may swell, get red, and feel warm to the touch. This is common and not a problem. If you need a tetanus shot and you choose not to have one, there is a rare chance of getting tetanus. Sickness from tetanus can be serious. SEEK MEDICAL CARE IF:   Your symptoms do not improve in 24 hours or are getting worse.   You have increasing pain  in the bite area.  SEEK IMMEDIATE MEDICAL CARE IF:   Your lesion appears to be getting larger (more than  inch [5 mm]), growing deeper, or looks infected.   You have chills or a fever.   You feel nauseous, vomit, have muscle aches, weakness, extreme tiredness, convulsions, or a red rash.   Your urine output decreases.   You have blood in your urine or notice other unusual bleeding.  Your skin turns yellow.  MAKE SURE YOU:   Understand these instructions.   Will watch your condition.   Will get help right away if you are not doing well or get worse.  Document Released: 06/03/2005 Document Revised: 05/23/2011 Document Reviewed: 01/02/2011 [facility] Patient Information 2012 [place], [place].Brown Recluse Spider Bite A brown recluse spider may be dark brown to light tan in color. It has a band of darker color shaped like a violin on its back. The whole spider (with legs) may grow to the size of 1 inch (2.5 cm). These spiders live undercover, outdoors, and in out-of-the-way places indoors. They can be found in the [place] the [address], [place], and mostly in the [place]. Brown recluse spider bites can be serious and life-threatening. SYMPTOMS  Symptoms can get worse over several days and may include:  Pain at the bite site. This may begin as a small, painful blister with redness around it. The pain and sore (lesion) caused by the bite can increase and spread over time. This may result in an area of tissue death up to 12 inches (30 cm) wide.   General feeling of illness (malaise).   Nausea and vomiting.   Fever.   Body aches.  TREATMENT  Your caregiver may prescribe a drug to prevent tissue death or to treat your lesion. If a large lesion develops, surgery may be needed to remove the damaged tissue. Certain medicines and antibiotics may be prescribed depending on the severity of your illness. However, there is no single antidote to treat this bite. Treatment focuses on caring for  your wound. HOME CARE INSTRUCTIONS   Do not scratch the bite area. Keep the area clean and covered with an adhesive bandage or sterile gauze bandage.   Wash the area daily in warm, soapy water.   Put ice or cool compresses on the bite area.   Put ice in a plastic bag.   Place a towel between your skin and the bag.   Leave the ice on for 20 to 30 minutes, 3 to 4 times a day or as directed.   Keep the bite area elevated above the level of your heart. This helps reduce swelling.   Take medicines as directed by your caregiver.  You may need a tetanus shot if:  You cannot remember when you had your last tetanus shot.   You have never had a tetanus shot.   The injury broke your skin.  If you get a tetanus shot, your arm may swell, get red, and feel warm to the touch. This is common and not a problem. If you need a tetanus shot and you choose not to have one, there is a rare chance of getting tetanus. Sickness from tetanus can be serious. SEEK MEDICAL CARE IF:   Your symptoms do not improve in 24 hours or are getting worse.   You have increasing pain in the bite area.  SEEK IMMEDIATE MEDICAL CARE IF:   Your lesion appears to be getting larger (more than  inch [5 mm]), growing deeper, or looks infected.   You have chills or a fever.   You feel nauseous, vomit, have muscle aches, weakness, extreme tiredness, convulsions, or a red rash.   Your urine output decreases.   You have blood in your urine or notice other unusual bleeding.   Your skin turns yellow.  MAKE SURE YOU:   Understand these instructions.   Will watch your condition.   Will get help  right away if you are not doing well or get worse.  Document Released: 06/03/2005 Document Revised: 05/23/2011 Document Reviewed: 01/02/2011 [facility] Patient Information 2012 [place], [place].Black Widow Spider Bite The adult female black widow spider has a black body about  inch (1.2 cm) long with an orange-red hourglass shape  on her belly. Black widow spider bites can be serious and life-threatening. SYMPTOMS  Symptoms usually develop in 15 minutes to 2 hours after the bite. Symptoms usually peak in 3 hours to 1 day and may include:  Muscle cramps. These may occur anywhere in the body, including the abdomen.   Nausea and vomiting.   Dizziness.   Heavy sweating.   Shaking.   Restlessness.   Fever.   Trouble breathing.   Chest pain.   Swelling or a rash around the bite.  TREATMENT  Serious reactions may require hospitalization. Your caregiver may give you an antivenom injection to help reduce pain. This is only done for very severe reactions that are not effectively treated with other medicines. There is a chance of having an allergic reaction to the antivenom since it is derived from horse serum. HOME CARE INSTRUCTIONS   Do not scratch the bite area. Keep the area clean and covered with an adhesive bandage or sterile gauze bandage.   Wash the area 3 times per day in warm, soapy water or as directed by your caregiver.   Put ice or cool compresses on the bite area.   Put ice in a plastic bag.   Place a towel between your skin and the bag.   Leave the ice on for 20 minutes, 4 times a day for the first 2 days or as directed.   Keep the bite area elevated above the level of your heart. This helps reduce redness and swelling.   Only take over-the-counter or prescription medicines for pain, discomfort, or fever as directed by your caregiver.   Watch the bite area closely for worsening pain, swelling, redness, or pus.  You may need a tetanus shot if:  You cannot remember when you had your last tetanus shot.   You have never had a tetanus shot.   The injury broke your skin.  If you get a tetanus shot, your arm may swell, get red, and feel warm to the touch. This is common and not a problem. If you need a tetanus shot and you choose not to have one, there is a rare chance of getting tetanus.  Sickness from tetanus can be serious. SEEK IMMEDIATE MEDICAL CARE IF:   You have muscle cramps or abdominal pain or cramps.   You develop rapid breathing or a rapid heartbeat.   You become extremely restless or confused.   You have chest pain.   You feel faint, lightheaded, or you feel generally ill (malaise).   You develop pain, soreness, redness, swelling, or drainage from the bite.   You have a fever.  MAKE SURE YOU:   Understand these instructions.   Will watch your condition.   Will get help right away if you are not doing well or get worse.  Document Released: 06/03/2005 Document Revised: 05/23/2011 Document Reviewed: 01/02/2011 [facility] Patient Information 2012 [place], [place].Black Widow Spider Bite The adult female black widow spider has a black body about  inch (1.2 cm) long with an orange-red hourglass shape on her belly. Black widow spider bites can be serious and life-threatening. SYMPTOMS  Symptoms usually develop in 15 minutes to 2 hours after the bite. Symptoms  usually peak in 3 hours to 1 day and may include:  Muscle cramps. These may occur anywhere in the body, including the abdomen.   Nausea and vomiting.   Dizziness.   Heavy sweating.   Shaking.   Restlessness.   Fever.   Trouble breathing.   Chest pain.   Swelling or a rash around the bite.  TREATMENT  Serious reactions may require hospitalization. Your caregiver may give you an antivenom injection to help reduce pain. This is only done for very severe reactions that are not effectively treated with other medicines. There is a chance of having an allergic reaction to the antivenom since it is derived from horse serum. HOME CARE INSTRUCTIONS   Do not scratch the bite area. Keep the area clean and covered with an adhesive bandage or sterile gauze bandage.   Wash the area 3 times per day in warm, soapy water or as directed by your caregiver.   Put ice or cool compresses on the bite area.    Put ice in a plastic bag.   Place a towel between your skin and the bag.   Leave the ice on for 20 minutes, 4 times a day for the first 2 days or as directed.   Keep the bite area elevated above the level of your heart. This helps reduce redness and swelling.   Only take over-the-counter or prescription medicines for pain, discomfort, or fever as directed by your caregiver.   Watch the bite area closely for worsening pain, swelling, redness, or pus.  You may need a tetanus shot if:  You cannot remember when you had your last tetanus shot.   You have never had a tetanus shot.   The injury broke your skin.  If you get a tetanus shot, your arm may swell, get red, and feel warm to the touch. This is common and not a problem. If you need a tetanus shot and you choose not to have one, there is a rare chance of getting tetanus. Sickness from tetanus can be serious. SEEK IMMEDIATE MEDICAL CARE IF:   You have muscle cramps or abdominal pain or cramps.   You develop rapid breathing or a rapid heartbeat.   You become extremely restless or confused.   You have chest pain.   You feel faint, lightheaded, or you feel generally ill (malaise).   You develop pain, soreness, redness, swelling, or drainage from the bite.   You have a fever.  MAKE SURE YOU:   Understand these instructions.   Will watch your condition.   Will get help right away if you are not doing well or get worse.  Document Released: 06/03/2005 Document Revised: 05/23/2011 Document Reviewed: 01/02/2011 [facility] Patient Information 2012 [place], [place].Black Widow Spider Bite The adult female black widow spider has a black body about  inch (1.2 cm) long with an orange-red hourglass shape on her belly. Black widow spider bites can be serious and life-threatening. SYMPTOMS  Symptoms usually develop in 15 minutes to 2 hours after the bite. Symptoms usually peak in 3 hours to 1 day and may include:  Muscle cramps. These  may occur anywhere in the body, including the abdomen.   Nausea and vomiting.   Dizziness.   Heavy sweating.   Shaking.   Restlessness.   Fever.   Trouble breathing.   Chest pain.   Swelling or a rash around the bite.  TREATMENT  Serious reactions may require hospitalization. Your caregiver may give you an antivenom injection  to help reduce pain. This is only done for very severe reactions that are not effectively treated with other medicines. There is a chance of having an allergic reaction to the antivenom since it is derived from horse serum. HOME CARE INSTRUCTIONS   Do not scratch the bite area. Keep the area clean and covered with an adhesive bandage or sterile gauze bandage.   Wash the area 3 times per day in warm, soapy water or as directed by your caregiver.   Put ice or cool compresses on the bite area.   Put ice in a plastic bag.   Place a towel between your skin and the bag.   Leave the ice on for 20 minutes, 4 times a day for the first 2 days or as directed.   Keep the bite area elevated above the level of your heart. This helps reduce redness and swelling.   Only take over-the-counter or prescription medicines for pain, discomfort, or fever as directed by your caregiver.   Watch the bite area closely for worsening pain, swelling, redness, or pus.  You may need a tetanus shot if:  You cannot remember when you had your last tetanus shot.   You have never had a tetanus shot.   The injury broke your skin.  If you get a tetanus shot, your arm may swell, get red, and feel warm to the touch. This is common and not a problem. If you need a tetanus shot and you choose not to have one, there is a rare chance of getting tetanus. Sickness from tetanus can be serious. SEEK IMMEDIATE MEDICAL CARE IF:   You have muscle cramps or abdominal pain or cramps.   You develop rapid breathing or a rapid heartbeat.   You become extremely restless or confused.   You have  chest pain.   You feel faint, lightheaded, or you feel generally ill (malaise).   You develop pain, soreness, redness, swelling, or drainage from the bite.   You have a fever.  MAKE SURE YOU:   Understand these instructions.   Will watch your condition.   Will get help right away if you are not doing well or get worse.  Document Released: 06/03/2005 Document Revised: 05/23/2011 Document Reviewed: 01/02/2011 [facility] Patient Information 2012 [place], [place].Black Widow Spider Bite The adult female black widow spider has a black body about  inch (1.2 cm) long with an orange-red hourglass shape on her belly. Black widow spider bites can be serious and life-threatening. SYMPTOMS  Symptoms usually develop in 15 minutes to 2 hours after the bite. Symptoms usually peak in 3 hours to 1 day and may include:  Muscle cramps. These may occur anywhere in the body, including the abdomen.   Nausea and vomiting.   Dizziness.   Heavy sweating.   Shaking.   Restlessness.   Fever.   Trouble breathing.   Chest pain.   Swelling or a rash around the bite.  TREATMENT  Serious reactions may require hospitalization. Your caregiver may give you an antivenom injection to help reduce pain. This is only done for very severe reactions that are not effectively treated with other medicines. There is a chance of having an allergic reaction to the antivenom since it is derived from horse serum. HOME CARE INSTRUCTIONS   Do not scratch the bite area. Keep the area clean and covered with an adhesive bandage or sterile gauze bandage.   Wash the area 3 times per day in warm, soapy water or as  directed by your caregiver.   Put ice or cool compresses on the bite area.   Put ice in a plastic bag.   Place a towel between your skin and the bag.   Leave the ice on for 20 minutes, 4 times a day for the first 2 days or as directed.   Keep the bite area elevated above the level of your heart. This helps  reduce redness and swelling.   Only take over-the-counter or prescription medicines for pain, discomfort, or fever as directed by your caregiver.   Watch the bite area closely for worsening pain, swelling, redness, or pus.  You may need a tetanus shot if:  You cannot remember when you had your last tetanus shot.   You have never had a tetanus shot.   The injury broke your skin.  If you get a tetanus shot, your arm may swell, get red, and feel warm to the touch. This is common and not a problem. If you need a tetanus shot and you choose not to have one, there is a rare chance of getting tetanus. Sickness from tetanus can be serious. SEEK IMMEDIATE MEDICAL CARE IF:   You have muscle cramps or abdominal pain or cramps.   You develop rapid breathing or a rapid heartbeat.   You become extremely restless or confused.   You have chest pain.   You feel faint, lightheaded, or you feel generally ill (malaise).   You develop pain, soreness, redness, swelling, or drainage from the bite.   You have a fever.  MAKE SURE YOU:   Understand these instructions.   Will watch your condition.   Will get help right away if you are not doing well or get worse.  Document Released: 06/03/2005 Document Revised: 05/23/2011 Document Reviewed: 01/02/2011 [facility] Patient Information 2012 [place], [place].

## 2011-09-09 NOTE — ED Notes (Signed)
Discharge instructions reviewed with pt; verbalizes understanding.  No questions asked; no further c/o's voiced.  Pt ambulatory to lobby.  NAD noted. 

## 2011-09-11 NOTE — ED Provider Notes (Signed)
Medical screening examination/treatment/procedure(s) were conducted as a shared visit with non-physician practitioner(s) and myself.  I personally evaluated the patient during the encounter 51 year old woman with rash on abdomen originating from a spider bite two days ago.  Exam shows her to have an excoritated papule 5 mm diameter on the skin of the RLQ, with surronding confluent rash about 5 cm diameter surrounding it.  She had received IV clindamycin, and was prescribed oral clindamycin and was given followup instructions.  Carleene Cooper III, MD 09/11/11 1018

## 2012-02-20 ENCOUNTER — Other Ambulatory Visit (HOSPITAL_COMMUNITY)
Admission: RE | Admit: 2012-02-20 | Discharge: 2012-02-20 | Disposition: A | Discharge status: Home / Self Care | Payer: Medicare Other | Source: Ambulatory Visit | Attending: Family Medicine | Admitting: Family Medicine

## 2012-02-20 DIAGNOSIS — Z01419 Encounter for gynecological examination (general) (routine) without abnormal findings: Secondary | ICD-10-CM | POA: Insufficient documentation

## 2012-02-20 LAB — HM PAP SMEAR: HM PAP: NORMAL

## 2012-02-26 ENCOUNTER — Other Ambulatory Visit: Payer: Self-pay | Admitting: Family Medicine

## 2012-02-26 DIAGNOSIS — Z780 Asymptomatic menopausal state: Secondary | ICD-10-CM

## 2012-03-10 ENCOUNTER — Ambulatory Visit
Admission: RE | Admit: 2012-03-10 | Discharge: 2012-03-10 | Disposition: A | Discharge status: Home / Self Care | Payer: Medicare Other | Source: Ambulatory Visit | Attending: Family Medicine | Admitting: Family Medicine

## 2012-03-10 DIAGNOSIS — Z1231 Encounter for screening mammogram for malignant neoplasm of breast: Secondary | ICD-10-CM

## 2012-03-10 DIAGNOSIS — Z780 Asymptomatic menopausal state: Secondary | ICD-10-CM

## 2012-12-21 ENCOUNTER — Other Ambulatory Visit: Payer: Self-pay | Admitting: Neurosurgery

## 2012-12-21 DIAGNOSIS — M48062 Spinal stenosis, lumbar region with neurogenic claudication: Secondary | ICD-10-CM

## 2012-12-26 ENCOUNTER — Ambulatory Visit
Admission: RE | Admit: 2012-12-26 | Discharge: 2012-12-26 | Disposition: A | Discharge status: Home / Self Care | Payer: Medicare Other | Source: Ambulatory Visit | Attending: Neurosurgery | Admitting: Neurosurgery

## 2012-12-26 DIAGNOSIS — M48062 Spinal stenosis, lumbar region with neurogenic claudication: Secondary | ICD-10-CM

## 2014-02-04 ENCOUNTER — Emergency Department (HOSPITAL_COMMUNITY)
Admission: EM | Admit: 2014-02-04 | Discharge: 2014-02-04 | Disposition: A | Discharge status: Home / Self Care | Payer: Medicare Other | Attending: Emergency Medicine | Admitting: Emergency Medicine

## 2014-02-04 DIAGNOSIS — M545 Low back pain, unspecified: Secondary | ICD-10-CM

## 2014-02-04 DIAGNOSIS — Z79899 Other long term (current) drug therapy: Secondary | ICD-10-CM | POA: Insufficient documentation

## 2014-02-04 DIAGNOSIS — I10 Essential (primary) hypertension: Secondary | ICD-10-CM | POA: Insufficient documentation

## 2014-02-04 DIAGNOSIS — W010XXA Fall on same level from slipping, tripping and stumbling without subsequent striking against object, initial encounter: Secondary | ICD-10-CM | POA: Insufficient documentation

## 2014-02-04 DIAGNOSIS — Y9289 Other specified places as the place of occurrence of the external cause: Secondary | ICD-10-CM | POA: Diagnosis not present

## 2014-02-04 DIAGNOSIS — Z8739 Personal history of other diseases of the musculoskeletal system and connective tissue: Secondary | ICD-10-CM | POA: Insufficient documentation

## 2014-02-04 DIAGNOSIS — IMO0002 Reserved for concepts with insufficient information to code with codable children: Secondary | ICD-10-CM | POA: Insufficient documentation

## 2014-02-04 DIAGNOSIS — Z8719 Personal history of other diseases of the digestive system: Secondary | ICD-10-CM | POA: Diagnosis not present

## 2014-02-04 DIAGNOSIS — M62830 Muscle spasm of back: Secondary | ICD-10-CM

## 2014-02-04 DIAGNOSIS — Y9389 Activity, other specified: Secondary | ICD-10-CM | POA: Diagnosis not present

## 2014-02-04 MED ORDER — METHOCARBAMOL 1000 MG/10ML IJ SOLN
1000.0000 mg | Freq: Once | INTRAVENOUS | Status: AC
  Administered 2014-02-04: 1000 mg via INTRAVENOUS
  Filled 2014-02-04: qty 10

## 2014-02-04 MED ORDER — METHOCARBAMOL 500 MG PO TABS: 500.0000 mg | ORAL_TABLET | Freq: Two times a day (BID) | ORAL | Status: DC

## 2014-02-04 MED ORDER — HYDROMORPHONE HCL PF 1 MG/ML IJ SOLN
1.0000 mg | INTRAMUSCULAR | Status: AC | PRN
  Administered 2014-02-04 (×2): 1 mg via INTRAVENOUS
  Filled 2014-02-04 (×2): qty 1

## 2014-02-04 MED ORDER — KETOROLAC TROMETHAMINE 30 MG/ML IJ SOLN
30.0000 mg | Freq: Once | INTRAMUSCULAR | Status: AC
  Administered 2014-02-04: 30 mg via INTRAVENOUS
  Filled 2014-02-04: qty 1

## 2014-02-04 MED ORDER — DIAZEPAM 5 MG/ML IJ SOLN
5.0000 mg | INTRAMUSCULAR | Status: AC | PRN
  Administered 2014-02-04 (×2): 5 mg via INTRAVENOUS
  Filled 2014-02-04: qty 2

## 2014-02-04 MED ORDER — DIAZEPAM 5 MG/ML IJ SOLN
5.0000 mg | Freq: Once | INTRAMUSCULAR | Status: AC
  Administered 2014-02-04: 5 mg via INTRAVENOUS
  Filled 2014-02-04: qty 2

## 2014-02-04 MED ORDER — DIAZEPAM 5 MG PO TABS: 5.0000 mg | ORAL_TABLET | Freq: Three times a day (TID) | ORAL | Status: DC | PRN

## 2014-02-04 MED ORDER — ONDANSETRON HCL 4 MG/2ML IJ SOLN
4.0000 mg | Freq: Once | INTRAMUSCULAR | Status: AC
  Administered 2014-02-04: 4 mg via INTRAVENOUS
  Filled 2014-02-04: qty 2

## 2014-02-04 NOTE — ED Notes (Signed)
Upon initial examination,  Pt is in acute back pain, and tearful

## 2014-02-04 NOTE — ED Notes (Signed)
MD at bedside. 

## 2014-02-04 NOTE — ED Notes (Addendum)
Per ems pt was in [place] before calling 911, reports she slipped on some baby lotion on the floor, when pt slipped she did not fall, but caught herself awkwardly, then pt was transported in a wheelchair to her car. Pt went to [employer], pts muscle spasms got worse while laying in the backseat of the car. Pt then reports she had a "syncopal episode" from hyperventilating.  Hx of fibromyalgia and back fusion. 20 G L hand, 271mcg fentanyl given, 4 mg zofran given. Pt immobilized

## 2014-02-04 NOTE — Discharge Instructions (Signed)
Muscle Cramps and Spasms Muscle cramps and spasms are when muscles tighten by themselves. They usually get better within minutes. Muscle cramps are painful. They are usually stronger and last longer than muscle spasms. Muscle spasms may or may not be painful. They can last a few seconds or much longer. HOME CARE  Drink enough fluid to keep your pee (urine) clear or pale yellow.  Massage, stretch, and relax the muscle.  Use a warm towel, heating pad, or warm shower water on tight muscles.  Place ice on the muscle if it is tender or in pain.  Put ice in a plastic bag.  Place a towel between your skin and the bag.  Leave the ice on for 15-20 minutes, 03-04 times a day.  Only take medicine as told by your doctor. GET HELP RIGHT AWAY IF:  Your cramps or spasms get worse, happen more often, or do not get better with time. MAKE SURE YOU:  Understand these instructions.  Will watch your condition.  Will get help right away if you are not doing well or get worse. Document Released: 05/16/2008 Document Revised: 09/28/2012 Document Reviewed: 05/20/2012 [facility] Patient Information 2015 [place], [place]. This information is not intended to replace advice given to you by your health care provider. Make sure you discuss any questions you have with your health care provider.  Back Pain, Adult Low back pain is very common. About 1 in 5 people have back pain.The cause of low back pain is rarely dangerous. The pain often gets better over time.About half of people with a sudden onset of back pain feel better in just 2 weeks. About 8 in 10 people feel better by 6 weeks.  CAUSES Some common causes of back pain include:  Strain of the muscles or ligaments supporting the spine.  Wear and tear (degeneration) of the spinal discs.  Arthritis.  Direct injury to the back. DIAGNOSIS Most of the time, the direct cause of low back pain is not known.However, back pain can be treated effectively even  when the exact cause of the pain is unknown.Answering your caregiver's questions about your overall health and symptoms is one of the most accurate ways to make sure the cause of your pain is not dangerous. If your caregiver needs more information, he or she may order lab work or imaging tests (X-rays or MRIs).However, even if imaging tests show changes in your back, this usually does not require surgery. HOME CARE INSTRUCTIONS For many people, back pain returns.Since low back pain is rarely dangerous, it is often a condition that people can learn to [facility] their own.   Remain active. It is stressful on the back to sit or stand in one place. Do not sit, drive, or stand in one place for more than 30 minutes at a time. Take short walks on level surfaces as soon as pain allows.Try to increase the length of time you walk each day.  Do not stay in bed.Resting more than 1 or 2 days can delay your recovery.  Do not avoid exercise or work.Your body is made to move.It is not dangerous to be active, even though your back may hurt.Your back will likely heal faster if you return to being active before your pain is gone.  Pay attention to your body when you bend and lift. Many people have less discomfortwhen lifting if they bend their knees, keep the load close to their bodies,and avoid twisting. Often, the most comfortable positions are those that put less stress  on your recovering back.  Find a comfortable position to sleep. Use a firm mattress and lie on your side with your knees slightly bent. If you lie on your back, put a pillow under your knees.  Only take over-the-counter or prescription medicines as directed by your caregiver. Over-the-counter medicines to reduce pain and inflammation are often the most helpful.Your caregiver may prescribe muscle relaxant drugs.These medicines help dull your pain so you can more quickly return to your normal activities and healthy exercise.  Put ice on  the injured area.  Put ice in a plastic bag.  Place a towel between your skin and the bag.  Leave the ice on for 15-20 minutes, 03-04 times a day for the first 2 to 3 days. After that, ice and heat may be alternated to reduce pain and spasms.  Ask your caregiver about trying back exercises and gentle massage. This may be of some benefit.  Avoid feeling anxious or stressed.Stress increases muscle tension and can worsen back pain.It is important to recognize when you are anxious or stressed and learn ways to manage it.Exercise is a great option. SEEK MEDICAL CARE IF:  You have pain that is not relieved with rest or medicine.  You have pain that does not improve in 1 week.  You have new symptoms.  You are generally not feeling well. SEEK IMMEDIATE MEDICAL CARE IF:   You have pain that radiates from your back into your legs.  You develop new bowel or bladder control problems.  You have unusual weakness or numbness in your arms or legs.  You develop nausea or vomiting.  You develop abdominal pain.  You feel faint. Document Released: 06/03/2005 Document Revised: 12/03/2011 Document Reviewed: 10/05/2013 [facility] Patient Information 2015 [place], [place]. This information is not intended to replace advice given to you by your health care provider. Make sure you discuss any questions you have with your health care provider.

## 2014-02-04 NOTE — ED Notes (Signed)
Dr Jeneen Rinks aware of pain and ordered to give other medication a little early.

## 2014-02-06 NOTE — ED Provider Notes (Signed)
CSN: [id number]     Arrival date & time 02/04/14  1827 History   First MD Initiated Contact with Patient 02/04/14 1849     Chief Complaint  Patient presents with  . Back Pain      HPI  Pt presents after slipping at a local business.  Her husband initially stated that "she went all sprawled out on the floor". However, she quickly corrects him, saying that she grabbed hold of the [employer] which kept her from falling to the floor.  However, she states that she "twisted hard". History of chronic back pain and states that most days she is in  6/10 pain.   Recent MRI, 7/12 showing :1. Mild progression of degenerative disease at L3-4 where there is  mild to moderate central canal and mild bilateral foraminal  narrowing.  2. No change in moderate central canal narrowing at L4-5 where  there is also some narrowing of the lateral recesses. Advanced  facet degenerative disease at this level is again seen.  3. No change in a right foraminal protrusion extending just into  the right lateral recess at L5-S1. The disc could impact either  the exiting right L5 or the descending right S1 roots.   She complains of spasming bilateral Paraspinal Lumbar pain.  No radicular pain.  No incontinence. No weakness.  Past Medical History  Diagnosis Date  . Hypertension   . Fibromyalgia   . Ankylosing spondylitis   . IBS (irritable bowel syndrome)   . Degenerative disc disease    No past surgical history on file. No family history on file. History  Substance Use Topics  . Smoking status: Never Smoker   . Smokeless tobacco: Not on file  . Alcohol Use: No   OB History   Grav Para Term Preterm Abortions TAB SAB Ect Mult Living                 Review of Systems  Constitutional: Negative for fever, chills, diaphoresis, appetite change and fatigue.  HENT: Negative for mouth sores, sore throat and trouble swallowing.   Eyes: Negative for visual disturbance.  Respiratory: Negative for cough,  chest tightness, shortness of breath and wheezing.   Cardiovascular: Negative for chest pain.  Gastrointestinal: Negative for nausea, vomiting, abdominal pain, diarrhea and abdominal distention.  Endocrine: Negative for polydipsia, polyphagia and polyuria.  Genitourinary: Negative for dysuria, frequency and hematuria.  Musculoskeletal: Positive for arthralgias, back pain and myalgias. Negative for gait problem.  Skin: Negative for color change, pallor and rash.  Neurological: Negative for dizziness, syncope, light-headedness and headaches.  Hematological: Does not bruise/bleed easily.  Psychiatric/Behavioral: Negative for behavioral problems and confusion.      Allergies  Sulfa antibiotics  Home Medications   Prior to Admission medications   Medication Sig Start Date End Date Taking? Authorizing Provider  amLODipine (NORVASC) 5 MG tablet Take 5 mg by mouth daily.   Yes Historical Provider, MD  clonazePAM (KLONOPIN) 1 MG tablet Take 1 mg by mouth daily as needed. For anxiety   Yes Historical Provider, MD  HYDROcodone-acetaminophen (NORCO) 10-325 MG per tablet Take 3 tablets by mouth 2 (two) times daily as needed. For pain   Yes Historical Provider, MD  lisinopril (PRINIVIL,ZESTRIL) 20 MG tablet Take 20 mg by mouth daily.   Yes Historical Provider, MD  loperamide (IMODIUM) 2 MG capsule Take 2 mg by mouth 4 (four) times daily as needed. For diarrhea   Yes Historical Provider, MD  promethazine (PHENERGAN) 25 MG  tablet Take 25 mg by mouth every 6 (six) hours as needed. For nausea   Yes Historical Provider, MD  diazepam (VALIUM) 5 MG tablet Take 1 tablet (5 mg total) by mouth every 8 (eight) hours as needed for muscle spasms. 02/04/14   Tanna Furry, MD  methocarbamol (ROBAXIN) 500 MG tablet Take 1 tablet (500 mg total) by mouth 2 (two) times daily. 02/04/14   Tanna Furry, MD   BP 154/82  Pulse 71  Temp(Src) 97.4 F (36.3 C) (Oral)  Resp 16  SpO2 100% Physical Exam  Constitutional: She is  oriented to person, place, and time. She appears well-developed and well-nourished. No distress.  HENT:  Head: Normocephalic.  Eyes: Conjunctivae are normal. Pupils are equal, round, and reactive to light. No scleral icterus.  Neck: Normal range of motion. Neck supple. No thyromegaly present.  Cardiovascular: Normal rate and regular rhythm.  Exam reveals no gallop and no friction rub.   No murmur heard. Pulmonary/Chest: Effort normal and breath sounds normal. No respiratory distress. She has no wheezes. She has no rales.  Abdominal: Soft. Bowel sounds are normal. She exhibits no distension. There is no tenderness. There is no rebound.  Musculoskeletal: Normal range of motion.       Back:  Neurological: She is alert and oriented to person, place, and time.  Skin: Skin is warm and dry. No rash noted.  Psychiatric: She has a normal mood and affect. Her behavior is normal.    ED Course  Procedures (including critical care time) Labs Review Labs Reviewed - No data to display  Imaging Review No results found.   EKG Interpretation None      MDM   Final diagnoses:  Back spasm  Right-sided low back pain without sciatica    Normal neurological exam.  No concerning neurological symptoms or findings.  No indication for imaging.  Pt given pain medications, muscle relaxants.  Down to 5-6/10.  [place] home.    Tanna Furry, MD 02/06/14 1705

## 2014-02-07 ENCOUNTER — Other Ambulatory Visit: Payer: Self-pay | Admitting: Neurosurgery

## 2014-02-07 DIAGNOSIS — M47817 Spondylosis without myelopathy or radiculopathy, lumbosacral region: Secondary | ICD-10-CM

## 2014-02-16 ENCOUNTER — Ambulatory Visit
Admission: RE | Admit: 2014-02-16 | Discharge: 2014-02-16 | Disposition: A | Discharge status: Home / Self Care | Payer: Medicare Other | Source: Ambulatory Visit | Attending: Neurosurgery | Admitting: Neurosurgery

## 2014-02-16 DIAGNOSIS — M47817 Spondylosis without myelopathy or radiculopathy, lumbosacral region: Secondary | ICD-10-CM

## 2014-06-21 LAB — FECAL OCCULT BLOOD, GUAIAC: FECAL OCCULT BLD: NEGATIVE

## 2016-03-13 ENCOUNTER — Encounter: Payer: Self-pay | Admitting: Internal Medicine

## 2016-03-13 ENCOUNTER — Ambulatory Visit (INDEPENDENT_AMBULATORY_CARE_PROVIDER_SITE_OTHER): Payer: Medicare Other | Admitting: Internal Medicine

## 2016-03-13 VITALS — BP 138/88 | HR 78 | Temp 98.7°F | Ht 67.0 in | Wt 218.0 lb

## 2016-03-13 DIAGNOSIS — I10 Essential (primary) hypertension: Secondary | ICD-10-CM

## 2016-03-13 DIAGNOSIS — M459 Ankylosing spondylitis of unspecified sites in spine: Secondary | ICD-10-CM | POA: Diagnosis not present

## 2016-03-13 DIAGNOSIS — M5136 Other intervertebral disc degeneration, lumbar region: Secondary | ICD-10-CM

## 2016-03-13 DIAGNOSIS — M797 Fibromyalgia: Secondary | ICD-10-CM

## 2016-03-13 DIAGNOSIS — K589 Irritable bowel syndrome without diarrhea: Secondary | ICD-10-CM | POA: Diagnosis not present

## 2016-03-13 NOTE — Progress Notes (Signed)
HPI  Pt presents to the clinic today to establish care and for management of the conditions listed below. She is transferring care from Dr. Tamala Julian at [facility].  Depression: She has been told that she had PTSD, bipolar. She has been treated for this in the past with multiple medications, but she is not interested in taking anything for this.  Ankylosing Spondylitis: She reports this is disabling. She follows with Dr. Carloyn Manner. She is using Cannabis Oil with good relief.  Fibromyalgia: She reports she hurts all over all the time. She uses Cannabis Oil with good relief.  IBS: Mainly diarrhea. She only takes Cannabis Oil for this.  DDD, back: She follows with Dr. Alvan Dame for this. She only takes the Cannabis Oil.  HTN: She is taking Lisinopril and Amlodipine 3-4 days after she gets shots in her knees.  Flu: never Tetanus:  07/2012 Pap Smear: unsure Mammogram: 2013 Dexa Scan: 2013 Colon Screening: never Vision Screening: yearly Dentist: as needed  Past Medical History:  Diagnosis Date  . Ankylosing spondylitis ([place])   . Degenerative disc disease   . Fibromyalgia   . Hypertension   . IBS (irritable bowel syndrome)     Current Outpatient Prescriptions  Medication Sig Dispense Refill  . amLODipine (NORVASC) 5 MG tablet Take 5 mg by mouth daily.    Marland Kitchen lisinopril (PRINIVIL,ZESTRIL) 20 MG tablet Take 20 mg by mouth daily.    . NON FORMULARY     . promethazine (PHENERGAN) 25 MG tablet Take 25 mg by mouth every 6 (six) hours as needed for nausea or vomiting.     No current facility-administered medications for this visit.     Allergies  Allergen Reactions  . Sulfa Antibiotics Hives and Swelling    Family History  Problem Relation Age of Onset  . Arthritis Mother   . Arthritis Father   . Hyperlipidemia Father   . Stroke Father   . Hypertension Father   . Diabetes Father   . Arthritis Maternal Grandmother   . Arthritis Maternal Grandfather   . Arthritis Paternal Grandmother   . Heart  disease Paternal Grandmother   . Stroke Paternal Grandmother   . Hypertension Paternal Grandmother   . Diabetes Paternal Grandmother   . Arthritis Paternal Grandfather   . Hypertension Paternal Grandfather   . Diabetes Paternal Grandfather     Social History   Social History  . Marital status: Married    Spouse name: N/A  . Number of children: N/A  . Years of education: N/A   Occupational History  . Not on file.   Social History Main Topics  . Smoking status: Never Smoker  . Smokeless tobacco: Not on file  . Alcohol use Yes     Comment: rare  . Drug use:     Types: Marijuana     Comment: Cannabis oil  . Sexual activity: Not on file   Other Topics Concern  . Not on file   Social History Narrative  . No narrative on file    ROS:  Constitutional: Denies fever, malaise, fatigue, headache or abrupt weight changes.  Respiratory: Denies difficulty breathing, shortness of breath, cough or sputum production.   Cardiovascular: Denies chest pain, chest tightness, palpitations or swelling in the hands or feet.  Gastrointestinal: Pt reports diarrhea. Denies abdominal pain, bloating, constipation, or blood in the stool.  Musculoskeletal: Pt reports back pain. Denies decrease in range of motion, difficulty with gait, or joint swelling.  Skin: Denies redness, rashes, lesions or ulcercations.  Neurological: Denies dizziness, difficulty with memory, difficulty with speech or problems with balance and coordination.  Psych: Pt reports depression. Denies anxiety, SI/HI.  No other specific complaints in a complete review of systems (except as listed in HPI above).  PE:  BP 138/88   Pulse 78   Temp 98.7 F (37.1 C) (Oral)   Ht 5\' 7"  (1.702 m)   Wt 218 lb (98.9 kg)   SpO2 98%   BMI 34.14 kg/m  Wt Readings from Last 3 Encounters:  03/13/16 218 lb (98.9 kg)    General: Appears her stated age, obese in NAD. Cardiovascular: Normal rate and rhythm. S1,S2 noted.  No murmur, rubs  or gallops noted.  Pulmonary/Chest: Normal effort and positive vesicular breath sounds. No respiratory distress. No wheezes, rales or ronchi noted.  Musculoskeletal: Using cane for assistance with gait. Neurological: Alert and oriented.  Psychiatric: Mood and affect normal. Behavior is normal. Judgment and thought content normal.   BMET    Component Value Date/Time   NA 139 09/09/2011 2110   K 3.5 09/09/2011 2110   CL 101 09/09/2011 2110   CO2 27 09/09/2011 2110   GLUCOSE 114 (H) 09/09/2011 2110   BUN 16 09/09/2011 2110   CREATININE 0.75 09/09/2011 2110   CALCIUM 9.1 09/09/2011 2110   GFRNONAA >90 09/09/2011 2110   GFRAA >90 09/09/2011 2110    Lipid Panel  No results found for: CHOL, TRIG, HDL, CHOLHDL, VLDL, LDLCALC  CBC    Component Value Date/Time   WBC 12.7 (H) 09/09/2011 2110   RBC 4.44 09/09/2011 2110   HGB 13.8 09/09/2011 2110   HCT 40.4 09/09/2011 2110   PLT 295 09/09/2011 2110   MCV 91.0 09/09/2011 2110   MCH 31.1 09/09/2011 2110   MCHC 34.2 09/09/2011 2110   RDW 13.3 09/09/2011 2110   LYMPHSABS 2.7 09/09/2011 2110   MONOABS 0.9 09/09/2011 2110   EOSABS 1.0 (H) 09/09/2011 2110   BASOSABS 0.0 09/09/2011 2110    Hgb A1C Lab Results  Component Value Date   HGBA1C  02/01/2010    5.5 (NOTE)                                                                       According to the ADA Clinical Practice Recommendations for 2011, when HbA1c is used as a screening test:   >=6.5%   Diagnostic of Diabetes Mellitus           (if abnormal result  is confirmed)  5.7-6.4%   Increased risk of developing Diabetes Mellitus  References:Diagnosis and Classification of Diabetes Mellitus,Diabetes [id number] 1):S62-S69 and Standards of Medical Care in         Diabetes - 2011,Diabetes Care,2011,34  (Suppl 1):S11-S61.     Assessment and Plan:

## 2016-03-13 NOTE — Patient Instructions (Signed)
Cannabis Use Disorder Cannabis use disorder is a mental disorder. It is not one-time or occasional use of cannabis, more commonly known as marijuana. Cannabis use disorder is the continued, nonmedical use of cannabis that interferes with normal life activities or causes health problems. People with cannabis use disorder get a feeling of extreme pleasure and relaxation from cannabis use. This "high" is very rewarding and causes people to use over and over.  The mind-altering ingredient in cannabis is know as THC. THC can also interfere with motor coordination, memory, judgment, and accurate sense of space and time. These effects can last for a few days after using cannabis. Regular heavy cannabis use can cause long-lasting problems with thinking and learning. In young people, these problems may be permanent. Cannabis sometimes causes severe anxiety, paranoia, or visual hallucinations. Man-made (synthetic) cannabis-like drugs, such as "spice" and "K2," cause the same effects as THC but are much stronger. Cannabis-like drugs can cause dangerously high blood pressure and heart rate.  Cannabis use disorder usually starts in the teenage years. It can trigger the development of schizophrenia. It is somewhat more common in men than women. People who have family members with the disorder or existing mental health issues such as depression and posttraumatic stress disorderare more likely to develop cannabis use disorder. People with cannabis use disorder are at higher risk for use of other drugs of abuse.  SIGNS AND SYMPTOMS Signs and symptoms of cannabis use disorder include:   Use of cannabis in larger amounts or over a longer period than intended.   Unsuccessful attempts to cut down or control cannabis use.   A lot of time spent obtaining, using, or recovering from the effects of cannabis.   A strong desire or urge to use cannabis (cravings).   Continued use of cannabis in spite of problems at work,  school, or home because of use.   Continued use of cannabis in spite of relationship problems because of use.  Giving up or cutting down on important life activities because of cannabis use.  Use of cannabis over and over even in situations when it is physically hazardous, such as when driving a car.   Continued use of cannabis in spite of a physical problem that is likely related to use. Physical problems can include:  Chronic cough.  Bronchitis.  Emphysema.  Throat and lung cancer.  Continued use of cannabis in spite of a mental problem that is likely related to use. Mental problems can include:  Psychosis.  Anxiety.  Difficulty sleeping.  Need to use more and more cannabis to get the same effect, or lessened effect over time with use of the same amount (tolerance).  Having withdrawal symptoms when cannabis use is stopped, or using cannabis to reduce or avoid withdrawal symptoms. Withdrawal symptoms include:  Irritability or anger.  Anxiety or restlessness.  Difficulty sleeping.  Loss of appetite or weight.  Aches and pains.  Shakiness.  Sweating.  Chills. DIAGNOSIS Cannabis use disorder is diagnosed by your health care provider. You may be asked questions about your cannabis use and how it affects your life. A physical exam may be done. A drug screen may be done. You may be referred to a mental health professional. The diagnosis of cannabis use disorder requires at least two symptoms within 12 months. The type of cannabis use disorder you have depends on the number of symptoms you have. The type may be:  Mild. Two or three signs and symptoms.   Moderate. Four or   five signs and symptoms.   Severe. Six or more signs and symptoms.  TREATMENT Treatment is usually provided by mental health professionals with training in substance use disorders. The following options are available:  Counseling or talk therapy. Talk therapy addresses the reasons you use  cannabis. It also addresses ways to keep you from using again. The goals of talk therapy include:  Identifying and avoiding triggers for use.  Learning how to handle cravings.  Replacing use with healthy activities.  Support groups. Support groups provide emotional support, advice, and guidance.  Medicine. Medicine is used to treat mental health issues that trigger cannabis use or that result from it. HOME CARE INSTRUCTIONS  Take medicines only as directed by your health care provider.  Check with your health care provider before starting any new medicines.  Keep all follow-up visits as directed by your health care provider. SEEK MEDICAL CARE IF:  You are not able to take your medicines as directed.  Your symptoms get worse. SEEK IMMEDIATE MEDICAL CARE IF: You have serious thoughts about hurting yourself or others. FOR MORE INFORMATION  National Institute on Drug Abuse: www.drugabuse.gov  Substance Abuse and Mental Health Services Administration: www.samhsa.gov   This information is not intended to replace advice given to you by your health care provider. Make sure you discuss any questions you have with your health care provider.   Document Released: 05/31/2000 Document Revised: 06/24/2014 Document Reviewed: 06/16/2013 Elsevier Interactive Patient Education 2016 [employer]  

## 2016-03-13 NOTE — Assessment & Plan Note (Signed)
She will continue to follow with Dr. Carloyn Manner She wants to treat with Cannabis Oil

## 2016-03-13 NOTE — Assessment & Plan Note (Signed)
BP fine off meds

## 2016-03-13 NOTE — Assessment & Plan Note (Signed)
She self treats with Cannabis Oil

## 2016-03-13 NOTE — Assessment & Plan Note (Signed)
She wants to treat with Cannabis Oil

## 2016-03-13 NOTE — Assessment & Plan Note (Signed)
She follows with Dr. Alvan Dame She wants to treat with Cannabis Oil

## 2016-04-02 ENCOUNTER — Ambulatory Visit (INDEPENDENT_AMBULATORY_CARE_PROVIDER_SITE_OTHER): Payer: Medicare Other | Admitting: Internal Medicine

## 2016-04-02 ENCOUNTER — Encounter: Payer: Self-pay | Admitting: Internal Medicine

## 2016-04-02 VITALS — BP 144/96 | HR 77 | Temp 97.9°F | Ht 67.0 in | Wt 215.2 lb

## 2016-04-02 DIAGNOSIS — Z0000 Encounter for general adult medical examination without abnormal findings: Secondary | ICD-10-CM

## 2016-04-02 DIAGNOSIS — Z1159 Encounter for screening for other viral diseases: Secondary | ICD-10-CM | POA: Diagnosis not present

## 2016-04-02 DIAGNOSIS — I10 Essential (primary) hypertension: Secondary | ICD-10-CM

## 2016-04-02 DIAGNOSIS — Z114 Encounter for screening for human immunodeficiency virus [HIV]: Secondary | ICD-10-CM

## 2016-04-02 LAB — CBC
HEMATOCRIT: 43.4 % (ref 36.0–46.0)
Hemoglobin: 14.8 g/dL (ref 12.0–15.0)
MCHC: 34.2 g/dL (ref 30.0–36.0)
MCV: 88.7 fl (ref 78.0–100.0)
PLATELETS: 272 10*3/uL (ref 150.0–400.0)
RBC: 4.89 Mil/uL (ref 3.87–5.11)
RDW: 13 % (ref 11.5–15.5)
WBC: 9.4 10*3/uL (ref 4.0–10.5)

## 2016-04-02 LAB — COMPREHENSIVE METABOLIC PANEL
ALBUMIN: 4.7 g/dL (ref 3.5–5.2)
ALT: 46 U/L — ABNORMAL HIGH (ref 0–35)
AST: 40 U/L — AB (ref 0–37)
Alkaline Phosphatase: 102 U/L (ref 39–117)
BUN: 10 mg/dL (ref 6–23)
CALCIUM: 9.6 mg/dL (ref 8.4–10.5)
CO2: 29 mEq/L (ref 19–32)
CREATININE: 0.65 mg/dL (ref 0.40–1.20)
Chloride: 103 mEq/L (ref 96–112)
GFR: 100.64 mL/min (ref >60.00)
Glucose, Bld: 94 mg/dL (ref 70–99)
POTASSIUM: 3 meq/L — AB (ref 3.5–5.1)
Sodium: 141 mEq/L (ref 135–145)
Total Bilirubin: 0.7 mg/dL (ref 0.2–1.2)
Total Protein: 7.4 g/dL (ref 6.0–8.3)

## 2016-04-02 LAB — HEMOGLOBIN A1C: Hgb A1c MFr Bld: 5.5 % (ref 4.6–6.5)

## 2016-04-02 LAB — LIPID PANEL
CHOLESTEROL: 172 mg/dL (ref 0–200)
HDL: 38.7 mg/dL — ABNORMAL LOW (ref >39.00)
LDL Cholesterol: 102 mg/dL — ABNORMAL HIGH (ref 0–99)
NonHDL: 132.88
TRIGLYCERIDES: 153 mg/dL — AB (ref 0.0–149.0)
Total CHOL/HDL Ratio: 4
VLDL: 30.6 mg/dL (ref 0.0–40.0)

## 2016-04-02 NOTE — Patient Instructions (Signed)

## 2016-04-02 NOTE — Progress Notes (Signed)
Subjective:    Patient ID: Belinda Calderon, female    DOB: 1961/02/20, 55 y.o.   MRN: [id number]  HPI  Pt presents to the clinic today for her annual exam.  Flu: never Tetanus:  08/2011 Pap Smear: 06/2014 Mammogram: 2013 Dexa Scan: 2013 Colon Screening: never Vision Screening: yearly Dentist: as needed  Diet: She does not eat meat. She consumes fruits and veggies daily. She does eat some fried food. She drinks sweet green tea and water. Exercise: She does Yoga, daily for about 30 minutes  Review of Systems  Past Medical History:  Diagnosis Date  . Ankylosing spondylitis ([place])   . Degenerative disc disease   . Fibromyalgia   . Hypertension   . IBS (irritable bowel syndrome)     Current Outpatient Prescriptions  Medication Sig Dispense Refill  . amLODipine (NORVASC) 5 MG tablet Take 5 mg by mouth daily.    Marland Kitchen lisinopril (PRINIVIL,ZESTRIL) 20 MG tablet Take 20 mg by mouth daily.    . NON FORMULARY     . promethazine (PHENERGAN) 25 MG tablet Take 25 mg by mouth every 6 (six) hours as needed for nausea or vomiting.     No current facility-administered medications for this visit.     Allergies  Allergen Reactions  . Sulfa Antibiotics Hives and Swelling    Family History  Problem Relation Age of Onset  . Arthritis Mother   . Arthritis Father   . Hyperlipidemia Father   . Stroke Father   . Hypertension Father   . Diabetes Father   . Arthritis Maternal Grandmother   . Arthritis Maternal Grandfather   . Arthritis Paternal Grandmother   . Heart disease Paternal Grandmother   . Stroke Paternal Grandmother   . Hypertension Paternal Grandmother   . Diabetes Paternal Grandmother   . Arthritis Paternal Grandfather   . Hypertension Paternal Grandfather   . Diabetes Paternal Grandfather     Social History   Social History  . Marital status: Married    Spouse name: N/A  . Number of children: N/A  . Years of education: N/A   Occupational History  . Not on file.     Social History Main Topics  . Smoking status: Never Smoker  . Smokeless tobacco: Never Used  . Alcohol use Yes     Comment: rare  . Drug use:     Types: Marijuana     Comment: Cannabis oil  . Sexual activity: Yes   Other Topics Concern  . Not on file   Social History Narrative  . No narrative on file     Constitutional: Denies fever, malaise, fatigue, headache or abrupt weight changes.  HEENT: Denies eye pain, eye redness, ear pain, ringing in the ears, wax buildup, runny nose, nasal congestion, bloody nose, or sore throat. Respiratory: Denies difficulty breathing, shortness of breath, cough or sputum production.   Cardiovascular: Denies chest pain, chest tightness, palpitations or swelling in the hands or feet.  Gastrointestinal: Denies abdominal pain, bloating, constipation, diarrhea or blood in the stool.  GU: Denies urgency, frequency, pain with urination, burning sensation, blood in urine, odor or discharge. Musculoskeletal: Pt reports chronic joint and muscle pain. Denies decrease in range of motion, difficulty with gait, or joint swelling.  Skin: Denies redness, rashes, lesions or ulcercations.  Neurological: Denies dizziness, difficulty with memory, difficulty with speech or problems with balance and coordination.  Psych: Denies anxiety, depression, SI/HI.  No other specific complaints in a complete review of systems (except  as listed in HPI above).     Objective:   Physical Exam  BP (!) 144/96   Pulse 77   Temp 97.9 F (36.6 C) (Oral)   Ht 5\' 7"  (1.702 m)   Wt 215 lb 4 oz (97.6 kg)   SpO2 97%   BMI 33.71 kg/m  Wt Readings from Last 3 Encounters:  04/02/16 215 lb 4 oz (97.6 kg)  03/13/16 218 lb (98.9 kg)    General: Appears her stated age, well developed, well nourished in NAD. Skin: Warm, dry and intact.  HEENT: Head: normal shape and size; Eyes: sclera white, no icterus, conjunctiva pink, PERRLA and EOMs intact; Ears: Tm's gray and intact, normal light  reflex; Throat/Mouth: Teeth present, mucosa pink and moist, no exudate, lesions or ulcerations noted.  Neck:  Neck supple, trachea midline. No masses, lumps or thyromegaly present.  Cardiovascular: Normal rate and rhythm. S1,S2 noted.  No murmur, rubs or gallops noted. No JVD or BLE edema. No carotid bruits noted. Pulmonary/Chest: Normal effort and positive vesicular breath sounds. No respiratory distress. No wheezes, rales or ronchi noted.  Abdomen: Soft and nontender. Normal bowel sounds. No distention or masses noted. Liver, spleen and kidneys non palpable. Musculoskeletal: Gait slow but steady.  Neurological: Alert and oriented. Cranial nerves II-XII grossly intact. Coordination normal.  Psychiatric: Mood and affect normal. Behavior is normal. Judgment and thought content normal.     BMET    Component Value Date/Time   NA 139 09/09/2011 2110   K 3.5 09/09/2011 2110   CL 101 09/09/2011 2110   CO2 27 09/09/2011 2110   GLUCOSE 114 (H) 09/09/2011 2110   BUN 16 09/09/2011 2110   CREATININE 0.75 09/09/2011 2110   CALCIUM 9.1 09/09/2011 2110   GFRNONAA >90 09/09/2011 2110   GFRAA >90 09/09/2011 2110    Lipid Panel  No results found for: CHOL, TRIG, HDL, CHOLHDL, VLDL, LDLCALC  CBC    Component Value Date/Time   WBC 12.7 (H) 09/09/2011 2110   RBC 4.44 09/09/2011 2110   HGB 13.8 09/09/2011 2110   HCT 40.4 09/09/2011 2110   PLT 295 09/09/2011 2110   MCV 91.0 09/09/2011 2110   MCH 31.1 09/09/2011 2110   MCHC 34.2 09/09/2011 2110   RDW 13.3 09/09/2011 2110   LYMPHSABS 2.7 09/09/2011 2110   MONOABS 0.9 09/09/2011 2110   EOSABS 1.0 (H) 09/09/2011 2110   BASOSABS 0.0 09/09/2011 2110    Hgb A1C Lab Results  Component Value Date   HGBA1C  02/01/2010    5.5 (NOTE)                                                                       According to the ADA Clinical Practice Recommendations for 2011, when HbA1c is used as a screening test:   >=6.5%   Diagnostic of Diabetes Mellitus            (if abnormal result  is confirmed)  5.7-6.4%   Increased risk of developing Diabetes Mellitus  References:Diagnosis and Classification of Diabetes Mellitus,Diabetes [id number] 1):S62-S69 and Standards of Medical Care in         Diabetes - 2011,Diabetes Care,2011,34  (Suppl 1):S11-S61.        Assessment & Plan:  Preventative Health Maintenance:  She declines flu shot today Tetanus UTD Pelvic exam due 2021 She declines mammogram She declines colonoscopy but is agreeable to Cologuard- ordered Encouraged her to consume a balanced diet and exercise regimen Advised her to see an eye doctor and dentist annually Will check CBC, CMET, Lipid, A1C, HIV and Hep C today  HTN:  Advised her to start taking the Lisinopril daily and Amlodipine only when she gets a knee injection Lisinopril and Amlodipine refilled today  RTC in 1 year for Medicare Wellness/Follow up Webb Silversmith, NP

## 2016-04-03 LAB — HIV ANTIBODY (ROUTINE TESTING W REFLEX): HIV 1&2 Ab, 4th Generation: NONREACTIVE

## 2016-04-03 LAB — HEPATITIS C ANTIBODY: HCV Ab: NEGATIVE

## 2016-04-08 ENCOUNTER — Encounter: Payer: Self-pay | Admitting: Internal Medicine

## 2016-04-12 ENCOUNTER — Encounter: Payer: Self-pay | Admitting: Internal Medicine

## 2016-05-07 LAB — COLOGUARD: Cologuard: NEGATIVE

## 2016-05-20 ENCOUNTER — Encounter: Payer: Self-pay | Admitting: Internal Medicine

## 2016-07-17 ENCOUNTER — Other Ambulatory Visit: Payer: Self-pay

## 2016-07-17 MED ORDER — LISINOPRIL 20 MG PO TABS: 20.0000 mg | ORAL_TABLET | Freq: Every day | ORAL | 2 refills | Status: DC

## 2016-07-17 NOTE — Telephone Encounter (Signed)
Pt left /vm requesting refill lisinopril to [facility] has not filled before but when pt was seen 04/02/16 for annual it was noted in visit that lisinopril and amlodipine was going to be filled today. Pt is getting shot today and wants to pick up lisinopril.Melanie said OK to refill. I cb and spoke with Mr Goldberg [facility] signed) and advised will send refill as requested. Mr Perini request 90 day. Done.

## 2016-08-28 ENCOUNTER — Ambulatory Visit
Admission: RE | Admit: 2016-08-28 | Discharge: 2016-08-28 | Disposition: A | Discharge status: Home / Self Care | Payer: Medicare Other | Source: Ambulatory Visit | Attending: Nurse Practitioner | Admitting: Nurse Practitioner

## 2016-08-28 ENCOUNTER — Other Ambulatory Visit: Payer: Self-pay | Admitting: Nurse Practitioner

## 2016-08-28 DIAGNOSIS — M47816 Spondylosis without myelopathy or radiculopathy, lumbar region: Secondary | ICD-10-CM

## 2016-10-17 ENCOUNTER — Telehealth: Payer: Self-pay | Admitting: Internal Medicine

## 2016-10-17 ENCOUNTER — Ambulatory Visit (INDEPENDENT_AMBULATORY_CARE_PROVIDER_SITE_OTHER): Payer: Medicare Other | Admitting: Internal Medicine

## 2016-10-17 ENCOUNTER — Encounter: Payer: Self-pay | Admitting: Internal Medicine

## 2016-10-17 VITALS — BP 168/128 | HR 76 | Temp 97.8°F | Wt 220.5 lb

## 2016-10-17 DIAGNOSIS — I10 Essential (primary) hypertension: Secondary | ICD-10-CM | POA: Diagnosis not present

## 2016-10-17 MED ORDER — LISINOPRIL-HYDROCHLOROTHIAZIDE 20-12.5 MG PO TABS: 2.0000 | ORAL_TABLET | Freq: Every day | ORAL | 0 refills | Status: DC

## 2016-10-17 NOTE — Telephone Encounter (Signed)
Pt has appt 10/17/16 at 9:00 with Avie Echevaria NP.

## 2016-10-17 NOTE — Patient Instructions (Signed)
Hypertension °Hypertension is another name for high blood pressure. High blood pressure forces your heart to work harder to pump blood. This can cause problems over time. °There are two numbers in a blood pressure reading. There is a top number (systolic) over a bottom number (diastolic). It is best to have a blood pressure below 120/80. Healthy choices can help lower your blood pressure. You may need medicine to help lower your blood pressure if: °· Your blood pressure cannot be lowered with healthy choices. °· Your blood pressure is higher than 130/80. °Follow these instructions at home: °Eating and drinking  °· If directed, follow the DASH eating plan. This diet includes: °¨ Filling half of your plate at each meal with fruits and vegetables. °¨ Filling one quarter of your plate at each meal with whole grains. Whole grains include whole wheat pasta, brown rice, and whole grain bread. °¨ Eating or drinking low-fat dairy products, such as skim milk or low-fat yogurt. °¨ Filling one quarter of your plate at each meal with low-fat (lean) proteins. Low-fat proteins include fish, skinless chicken, eggs, beans, and tofu. °¨ Avoiding fatty meat, cured and processed meat, or chicken with skin. °¨ Avoiding premade or processed food. °· Eat less than 1,500 mg of salt (sodium) a day. °· Limit alcohol use to no more than 1 drink a day for nonpregnant women and 2 drinks a day for men. One drink equals 12 oz of beer, 5 oz of wine, or 1½ oz of hard liquor. °Lifestyle  °· Work with your doctor to stay at a healthy weight or to lose weight. Ask your doctor what the best weight is for you. °· Get at least 30 minutes of exercise that causes your heart to beat faster (aerobic exercise) most days of the week. This may include walking, swimming, or biking. °· Get at least 30 minutes of exercise that strengthens your muscles (resistance exercise) at least 3 days a week. This may include lifting weights or pilates. °· Do not use any  products that contain nicotine or tobacco. This includes cigarettes and e-cigarettes. If you need help quitting, ask your doctor. °· Check your blood pressure at home as told by your doctor. °· Keep all follow-up visits as told by your doctor. This is important. °Medicines  °· Take over-the-counter and prescription medicines only as told by your doctor. Follow directions carefully. °· Do not skip doses of blood pressure medicine. The medicine does not work as well if you skip doses. Skipping doses also puts you at risk for problems. °· Ask your doctor about side effects or reactions to medicines that you should watch for. °Contact a doctor if: °· You think you are having a reaction to the medicine you are taking. °· You have headaches that keep coming back (recurring). °· You feel dizzy. °· You have swelling in your ankles. °· You have trouble with your vision. °Get help right away if: °· You get a very bad headache. °· You start to feel confused. °· You feel weak or numb. °· You feel faint. °· You get very bad pain in your: °¨ Chest. °¨ Belly (abdomen). °· You throw up (vomit) more than once. °· You have trouble breathing. °Summary °· Hypertension is another name for high blood pressure. °· Making healthy choices can help lower blood pressure. If your blood pressure cannot be controlled with healthy choices, you may need to take medicine. °This information is not intended to replace advice given to you by your   health care provider. Make sure you discuss any questions you have with your health care provider. °Document Released: 11/20/2007 Document Revised: 05/01/2016 Document Reviewed: 05/01/2016 °Elsevier Interactive Patient Education © 2017 [employer] ° °

## 2016-10-17 NOTE — Telephone Encounter (Signed)
Patient Name: Belinda Calderon  DOB: 03/23/1961    Initial Comment Caller states her Lisinipril was reduced from 20mg  to 5mg . Her current PB is 213/118.    Nurse Assessment  Nurse: Raphael Gibney, RN, Vanita Ingles Date/Time (Eastern Time): 10/17/2016 8:06:55 AM  Confirm and document reason for call. If symptomatic, describe symptoms. ---Caller states her lisinopril from 20 mg to 5 mg daily. Used to take amlodipine. She is supposed to take steroid shot in her knee today. BP 213/118 and it is now 189/118. She is anxious and irritable. BP 237/119 last night. She took lisinopril 20 mg last night.  Does the patient have any new or worsening symptoms? ---Yes  Will a triage be completed? ---Yes  Related visit to physician within the last 2 weeks? ---No  Does the PT have any chronic conditions? (i.e. diabetes, asthma, etc.) ---Yes  List chronic conditions. ---HTN; fibromyalgia;  Is this a behavioral health or substance abuse call? ---No     Guidelines    Guideline Title Affirmed Question Affirmed Notes  High Blood Pressure Systolic BP >= 627 OR Diastolic >= 035    Final Disposition User   See Physician within 24 Hours [place], RN, Vanita Ingles    Comments  appt scheduled for 9 am 10/17/2016 with Webb Silversmith   Referrals  REFERRED TO PCP OFFICE   Disagree/Comply: Leta Baptist

## 2016-10-17 NOTE — Progress Notes (Signed)
Subjective:    Patient ID: Belinda Calderon, female    DOB: 15-Aug-1960, 56 y.o.   MRN: [id number]  HPI  Pt presents to the clinic today with c/o elevated blood pressure. She reports over the last few days, her blood pressure has been extremely elevated. Last night, it was as high as 237/103. She reports she does not have HTN. Her blood pressure is elevated secondary to her chronic pain from fibromyalgia and ankylosing spondylitis. She reports she usually only takes a dose of Lisinopril and Norvasc x 1 prior to going to get injections. She does report that she has been taking Lisinopril daily x 4 days. She does not have any of the Norvasc. She has been having some intermittent headaches and dizziness, but denies chest pain or shortness of breath.  Review of Systems      Past Medical History:  Diagnosis Date  . Ankylosing spondylitis ([place])   . Degenerative disc disease   . Fibromyalgia   . Hypertension   . IBS (irritable bowel syndrome)     Current Outpatient Prescriptions  Medication Sig Dispense Refill  . amLODipine (NORVASC) 5 MG tablet Take 5 mg by mouth daily.    Marland Kitchen lisinopril (PRINIVIL,ZESTRIL) 20 MG tablet Take 1 tablet (20 mg total) by mouth daily. 90 tablet 2  . NON FORMULARY     . promethazine (PHENERGAN) 25 MG tablet Take 25 mg by mouth every 6 (six) hours as needed for nausea or vomiting.     No current facility-administered medications for this visit.     Allergies  Allergen Reactions  . Sulfa Antibiotics Hives and Swelling    Family History  Problem Relation Age of Onset  . Arthritis Mother   . Arthritis Father   . Hyperlipidemia Father   . Stroke Father   . Hypertension Father   . Diabetes Father   . Arthritis Maternal Grandmother   . Arthritis Maternal Grandfather   . Arthritis Paternal Grandmother   . Heart disease Paternal Grandmother   . Stroke Paternal Grandmother   . Hypertension Paternal Grandmother   . Diabetes Paternal Grandmother   .  Arthritis Paternal Grandfather   . Hypertension Paternal Grandfather   . Diabetes Paternal Grandfather     Social History   Social History  . Marital status: Married    Spouse name: N/A  . Number of children: N/A  . Years of education: N/A   Occupational History  . Not on file.   Social History Main Topics  . Smoking status: Never Smoker  . Smokeless tobacco: Never Used  . Alcohol use Yes     Comment: rare  . Drug use: Yes    Types: Marijuana     Comment: Cannabis oil  . Sexual activity: Yes   Other Topics Concern  . Not on file   Social History Narrative  . No narrative on file     Constitutional: Pt reports headache. Denies fever, malaise, fatigue, or abrupt weight changes.  Respiratory: Denies difficulty breathing, shortness of breath, cough or sputum production.   Cardiovascular: Denies chest pain, chest tightness, palpitations or swelling in the hands or feet.  Neurological: Pt reports intermittent headaches. Denies difficulty with memory, difficulty with speech or problems with balance and coordination.  Psych: Pt reports anxiety. Denies depression, SI/HI.  No other specific complaints in a complete review of systems (except as listed in HPI above).  Objective:   Physical Exam  BP (!) 168/128   Pulse 76  Temp 97.8 F (36.6 C) (Oral)   Wt 220 lb 8 oz (100 kg)   SpO2 97%   BMI 34.54 kg/m  Wt Readings from Last 3 Encounters:  10/17/16 220 lb 8 oz (100 kg)  04/02/16 215 lb 4 oz (97.6 kg)  03/13/16 218 lb (98.9 kg)    General: Appears her stated age, obese in NAD. Cardiovascular: Normal rate and rhythm. S1,S2 noted.  No murmur, rubs or gallops noted.  Pulmonary/Chest: Normal effort and positive vesicular breath sounds. No respiratory distress. No wheezes, rales or ronchi noted.  Neurological: Alert and oriented. Coordination normal.    BMET    Component Value Date/Time   NA 141 04/02/2016 1403   K 3.0 (L) 04/02/2016 1403   CL 103 04/02/2016 1403    CO2 29 04/02/2016 1403   GLUCOSE 94 04/02/2016 1403   BUN 10 04/02/2016 1403   CREATININE 0.65 04/02/2016 1403   CALCIUM 9.6 04/02/2016 1403   GFRNONAA >90 09/09/2011 2110   GFRAA >90 09/09/2011 2110    Lipid Panel     Component Value Date/Time   CHOL 172 04/02/2016 1403   TRIG 153.0 (H) 04/02/2016 1403   HDL 38.70 (L) 04/02/2016 1403   CHOLHDL 4 04/02/2016 1403   VLDL 30.6 04/02/2016 1403   LDLCALC 102 (H) 04/02/2016 1403    CBC    Component Value Date/Time   WBC 9.4 04/02/2016 1403   RBC 4.89 04/02/2016 1403   HGB 14.8 04/02/2016 1403   HCT 43.4 04/02/2016 1403   PLT 272.0 04/02/2016 1403   MCV 88.7 04/02/2016 1403   MCH 31.1 09/09/2011 2110   MCHC 34.2 04/02/2016 1403   RDW 13.0 04/02/2016 1403   LYMPHSABS 2.7 09/09/2011 2110   MONOABS 0.9 09/09/2011 2110   EOSABS 1.0 (H) 09/09/2011 2110   BASOSABS 0.0 09/09/2011 2110    Hgb A1C Lab Results  Component Value Date   HGBA1C 5.5 04/02/2016            Assessment & Plan:   HTN:  Discussed BP goal of < 130/80 At this point, I would like to see her under 150/90 Stop Lisinopril and Norvasc eRx for Lisinopril HCT, 2 tabs daily Discussed low salt, low carb diet and exercise for weight loss  RTC in 2 weeks for BP follow up, BMET at that visit Webb Silversmith, NP

## 2016-10-31 ENCOUNTER — Encounter: Payer: Self-pay | Admitting: Internal Medicine

## 2016-10-31 ENCOUNTER — Ambulatory Visit (INDEPENDENT_AMBULATORY_CARE_PROVIDER_SITE_OTHER): Payer: Medicare Other | Admitting: Internal Medicine

## 2016-10-31 VITALS — BP 148/80 | HR 83 | Temp 97.9°F | Wt 219.0 lb

## 2016-10-31 DIAGNOSIS — I10 Essential (primary) hypertension: Secondary | ICD-10-CM | POA: Diagnosis not present

## 2016-10-31 DIAGNOSIS — M7550 Bursitis of unspecified shoulder: Secondary | ICD-10-CM | POA: Diagnosis not present

## 2016-10-31 DIAGNOSIS — M25512 Pain in left shoulder: Secondary | ICD-10-CM | POA: Diagnosis not present

## 2016-10-31 MED ORDER — NAPROXEN 500 MG PO TBEC: 500.0000 mg | DELAYED_RELEASE_TABLET | Freq: Two times a day (BID) | ORAL | 2 refills | Status: DC

## 2016-10-31 MED ORDER — LISINOPRIL-HYDROCHLOROTHIAZIDE 20-12.5 MG PO TABS: 2.0000 | ORAL_TABLET | Freq: Every day | ORAL | 0 refills | Status: DC

## 2016-10-31 MED ORDER — AMLODIPINE BESYLATE 10 MG PO TABS: 10.0000 mg | ORAL_TABLET | Freq: Every day | ORAL | 0 refills | Status: DC

## 2016-10-31 MED ORDER — KETOROLAC TROMETHAMINE 60 MG/2ML IM SOLN
60.0000 mg | Freq: Once | INTRAMUSCULAR | Status: AC
  Administered 2016-10-31: 60 mg via INTRAMUSCULAR

## 2016-10-31 NOTE — Addendum Note (Signed)
Addended by: Lurlean Nanny on: 10/31/2016 11:10 AM   Modules accepted: Orders

## 2016-10-31 NOTE — Assessment & Plan Note (Signed)
Improved but still not at goal Continue Lisinopril HCT, refilled today Add Norvasc 10 mg daily

## 2016-10-31 NOTE — Progress Notes (Signed)
Subjective:    Patient ID: Belinda Calderon, female    DOB: [birth year], 56 y.o.   MRN: [id number]  HPI  Pt presents to the clinic today for 2 week follow up of HTN. At her last visit, she was started on Lisinopril HCT, 2 tabs daily. She has been taking the medication as prescribed. She has been having hot flashes and some palpitations, but thinks this may be anxiety and pain related. Her BP today is 148/80. ECG from 12/2010 reviewed.  She also c/o left shoulder pain. This started yesterday. She describes the pain as sharp and stabbing. The pain does not radiate. She denies numbness or tingling in her left arm. The pain is worse with any kind of movement. She denies any injury to the area. She has not taken anything OTC for this.  Review of Systems      Past Medical History:  Diagnosis Date  . Ankylosing spondylitis ([place])   . Degenerative disc disease   . Fibromyalgia   . Hypertension   . IBS (irritable bowel syndrome)     Current Outpatient Prescriptions  Medication Sig Dispense Refill  . lisinopril-hydrochlorothiazide (PRINZIDE,ZESTORETIC) 20-12.5 MG tablet Take 2 tablets by mouth daily. 60 tablet 0  . NON FORMULARY     . promethazine (PHENERGAN) 25 MG tablet Take 25 mg by mouth every 6 (six) hours as needed for nausea or vomiting.     No current facility-administered medications for this visit.     Allergies  Allergen Reactions  . Sulfa Antibiotics Hives and Swelling    Family History  Problem Relation Age of Onset  . Arthritis Mother   . Arthritis Father   . Hyperlipidemia Father   . Stroke Father   . Hypertension Father   . Diabetes Father   . Arthritis Maternal Grandmother   . Arthritis Maternal Grandfather   . Arthritis Paternal Grandmother   . Heart disease Paternal Grandmother   . Stroke Paternal Grandmother   . Hypertension Paternal Grandmother   . Diabetes Paternal Grandmother   . Arthritis Paternal Grandfather   . Hypertension Paternal Grandfather   .  Diabetes Paternal Grandfather     Social History   Social History  . Marital status: Married    Spouse name: N/A  . Number of children: N/A  . Years of education: N/A   Occupational History  . Not on file.   Social History Main Topics  . Smoking status: Never Smoker  . Smokeless tobacco: Never Used  . Alcohol use Yes     Comment: rare  . Drug use: Yes    Types: Marijuana     Comment: Cannabis oil  . Sexual activity: Yes   Other Topics Concern  . Not on file   Social History Narrative  . No narrative on file     Constitutional: Pt reports hot flashes. Denies fever, malaise, fatigue, headache or abrupt weight changes.  Respiratory: Denies difficulty breathing, shortness of breath, cough or sputum production.   Cardiovascular: Pt reports palpitations. Denies chest pain, chest tightness, or swelling in the hands or feet.  Musculoskeletal: Pt reports left shoulder pain. Denies decrease in range of motion, difficulty with gait, muscle pain or joint swelling.  Neurological: Denies dizziness, difficulty with memory, difficulty with speech or problems with balance and coordination.    No other specific complaints in a complete review of systems (except as listed in HPI above).  Objective:   Physical Exam  BP (!) 148/80   Pulse 83  Temp 97.9 F (36.6 C) (Oral)   Wt 219 lb (99.3 kg)   SpO2 97%   BMI 34.30 kg/m  Wt Readings from Last 3 Encounters:  10/31/16 219 lb (99.3 kg)  10/17/16 220 lb 8 oz (100 kg)  04/02/16 215 lb 4 oz (97.6 kg)    General: Appears her stated age, obese in NAD. Cardiovascular: Normal rate and rhythm. S1,S2 noted.  No murmur, rubs or gallops noted.  Pulmonary/Chest: Normal effort and positive vesicular breath sounds. No respiratory distress. No wheezes, rales or ronchi noted.  Musculoskeletal: Decreased internal and external rotation of the left shoulder. Decreased abduction and adduction of the left shoulder. No pain with palpation over the [facility]  joint. Pain with palpation of the subacromial bursa. Positive drop can on the left. Neurological: Alert and oriented.    BMET    Component Value Date/Time   NA 141 04/02/2016 1403   K 3.0 (L) 04/02/2016 1403   CL 103 04/02/2016 1403   CO2 29 04/02/2016 1403   GLUCOSE 94 04/02/2016 1403   BUN 10 04/02/2016 1403   CREATININE 0.65 04/02/2016 1403   CALCIUM 9.6 04/02/2016 1403   GFRNONAA >90 09/09/2011 2110   GFRAA >90 09/09/2011 2110    Lipid Panel     Component Value Date/Time   CHOL 172 04/02/2016 1403   TRIG 153.0 (H) 04/02/2016 1403   HDL 38.70 (L) 04/02/2016 1403   CHOLHDL 4 04/02/2016 1403   VLDL 30.6 04/02/2016 1403   LDLCALC 102 (H) 04/02/2016 1403    CBC    Component Value Date/Time   WBC 9.4 04/02/2016 1403   RBC 4.89 04/02/2016 1403   HGB 14.8 04/02/2016 1403   HCT 43.4 04/02/2016 1403   PLT 272.0 04/02/2016 1403   MCV 88.7 04/02/2016 1403   MCH 31.1 09/09/2011 2110   MCHC 34.2 04/02/2016 1403   RDW 13.0 04/02/2016 1403   LYMPHSABS 2.7 09/09/2011 2110   MONOABS 0.9 09/09/2011 2110   EOSABS 1.0 (H) 09/09/2011 2110   BASOSABS 0.0 09/09/2011 2110    Hgb A1C Lab Results  Component Value Date   HGBA1C 5.5 04/02/2016            Assessment & Plan:   Left Shoulder Pain secondary to Subacromial Bursitis:  60 mg Toradol IM today eRx for Naproxen 500 mg BID, no additional Aleve, Ibuprofen etc Avoid overuse of the left arm She has it in a sling today, ok to continue for no more than 1 week If no improvement, consider xray of left shoulder and Prednisone therapy  RTC in 2 weeks for follow up of HTN BAITY, REGINA, NP

## 2016-10-31 NOTE — Patient Instructions (Signed)
Hypertension °Hypertension is another name for high blood pressure. High blood pressure forces your heart to work harder to pump blood. This can cause problems over time. °There are two numbers in a blood pressure reading. There is a top number (systolic) over a bottom number (diastolic). It is best to have a blood pressure below 120/80. Healthy choices can help lower your blood pressure. You may need medicine to help lower your blood pressure if: °· Your blood pressure cannot be lowered with healthy choices. °· Your blood pressure is higher than 130/80. °Follow these instructions at home: °Eating and drinking  °· If directed, follow the DASH eating plan. This diet includes: °¨ Filling half of your plate at each meal with fruits and vegetables. °¨ Filling one quarter of your plate at each meal with whole grains. Whole grains include whole wheat pasta, brown rice, and whole grain bread. °¨ Eating or drinking low-fat dairy products, such as skim milk or low-fat yogurt. °¨ Filling one quarter of your plate at each meal with low-fat (lean) proteins. Low-fat proteins include fish, skinless chicken, eggs, beans, and tofu. °¨ Avoiding fatty meat, cured and processed meat, or chicken with skin. °¨ Avoiding premade or processed food. °· Eat less than 1,500 mg of salt (sodium) a day. °· Limit alcohol use to no more than 1 drink a day for nonpregnant women and 2 drinks a day for men. One drink equals 12 oz of beer, 5 oz of wine, or 1½ oz of hard liquor. °Lifestyle  °· Work with your doctor to stay at a healthy weight or to lose weight. Ask your doctor what the best weight is for you. °· Get at least 30 minutes of exercise that causes your heart to beat faster (aerobic exercise) most days of the week. This may include walking, swimming, or biking. °· Get at least 30 minutes of exercise that strengthens your muscles (resistance exercise) at least 3 days a week. This may include lifting weights or pilates. °· Do not use any  products that contain nicotine or tobacco. This includes cigarettes and e-cigarettes. If you need help quitting, ask your doctor. °· Check your blood pressure at home as told by your doctor. °· Keep all follow-up visits as told by your doctor. This is important. °Medicines  °· Take over-the-counter and prescription medicines only as told by your doctor. Follow directions carefully. °· Do not skip doses of blood pressure medicine. The medicine does not work as well if you skip doses. Skipping doses also puts you at risk for problems. °· Ask your doctor about side effects or reactions to medicines that you should watch for. °Contact a doctor if: °· You think you are having a reaction to the medicine you are taking. °· You have headaches that keep coming back (recurring). °· You feel dizzy. °· You have swelling in your ankles. °· You have trouble with your vision. °Get help right away if: °· You get a very bad headache. °· You start to feel confused. °· You feel weak or numb. °· You feel faint. °· You get very bad pain in your: °¨ Chest. °¨ Belly (abdomen). °· You throw up (vomit) more than once. °· You have trouble breathing. °Summary °· Hypertension is another name for high blood pressure. °· Making healthy choices can help lower blood pressure. If your blood pressure cannot be controlled with healthy choices, you may need to take medicine. °This information is not intended to replace advice given to you by your   health care provider. Make sure you discuss any questions you have with your health care provider. °Document Released: 11/20/2007 Document Revised: 05/01/2016 Document Reviewed: 05/01/2016 °Elsevier Interactive Patient Education © 2017 [employer] ° °

## 2016-11-12 ENCOUNTER — Encounter: Payer: Self-pay | Admitting: Internal Medicine

## 2016-11-12 ENCOUNTER — Ambulatory Visit (INDEPENDENT_AMBULATORY_CARE_PROVIDER_SITE_OTHER): Payer: Medicare Other | Admitting: Internal Medicine

## 2016-11-12 VITALS — BP 106/72 | HR 93 | Temp 97.8°F | Wt 217.8 lb

## 2016-11-12 DIAGNOSIS — I10 Essential (primary) hypertension: Secondary | ICD-10-CM

## 2016-11-12 DIAGNOSIS — M7552 Bursitis of left shoulder: Secondary | ICD-10-CM

## 2016-11-12 DIAGNOSIS — M25512 Pain in left shoulder: Secondary | ICD-10-CM

## 2016-11-12 MED ORDER — AMLODIPINE BESYLATE 10 MG PO TABS: 5.0000 mg | ORAL_TABLET | Freq: Every day | ORAL | 0 refills | Status: DC

## 2016-11-12 NOTE — Assessment & Plan Note (Signed)
Continue Lisinopril HCT Decrease Norvasc to 5 mg daily

## 2016-11-12 NOTE — Patient Instructions (Signed)
Hypertension °Hypertension is another name for high blood pressure. High blood pressure forces your heart to work harder to pump blood. This can cause problems over time. °There are two numbers in a blood pressure reading. There is a top number (systolic) over a bottom number (diastolic). It is best to have a blood pressure below 120/80. Healthy choices can help lower your blood pressure. You may need medicine to help lower your blood pressure if: °· Your blood pressure cannot be lowered with healthy choices. °· Your blood pressure is higher than 130/80. °Follow these instructions at home: °Eating and drinking  °· If directed, follow the DASH eating plan. This diet includes: °¨ Filling half of your plate at each meal with fruits and vegetables. °¨ Filling one quarter of your plate at each meal with whole grains. Whole grains include whole wheat pasta, brown rice, and whole grain bread. °¨ Eating or drinking low-fat dairy products, such as skim milk or low-fat yogurt. °¨ Filling one quarter of your plate at each meal with low-fat (lean) proteins. Low-fat proteins include fish, skinless chicken, eggs, beans, and tofu. °¨ Avoiding fatty meat, cured and processed meat, or chicken with skin. °¨ Avoiding premade or processed food. °· Eat less than 1,500 mg of salt (sodium) a day. °· Limit alcohol use to no more than 1 drink a day for nonpregnant women and 2 drinks a day for men. One drink equals 12 oz of beer, 5 oz of wine, or 1½ oz of hard liquor. °Lifestyle  °· Work with your doctor to stay at a healthy weight or to lose weight. Ask your doctor what the best weight is for you. °· Get at least 30 minutes of exercise that causes your heart to beat faster (aerobic exercise) most days of the week. This may include walking, swimming, or biking. °· Get at least 30 minutes of exercise that strengthens your muscles (resistance exercise) at least 3 days a week. This may include lifting weights or pilates. °· Do not use any  products that contain nicotine or tobacco. This includes cigarettes and e-cigarettes. If you need help quitting, ask your doctor. °· Check your blood pressure at home as told by your doctor. °· Keep all follow-up visits as told by your doctor. This is important. °Medicines  °· Take over-the-counter and prescription medicines only as told by your doctor. Follow directions carefully. °· Do not skip doses of blood pressure medicine. The medicine does not work as well if you skip doses. Skipping doses also puts you at risk for problems. °· Ask your doctor about side effects or reactions to medicines that you should watch for. °Contact a doctor if: °· You think you are having a reaction to the medicine you are taking. °· You have headaches that keep coming back (recurring). °· You feel dizzy. °· You have swelling in your ankles. °· You have trouble with your vision. °Get help right away if: °· You get a very bad headache. °· You start to feel confused. °· You feel weak or numb. °· You feel faint. °· You get very bad pain in your: °¨ Chest. °¨ Belly (abdomen). °· You throw up (vomit) more than once. °· You have trouble breathing. °Summary °· Hypertension is another name for high blood pressure. °· Making healthy choices can help lower blood pressure. If your blood pressure cannot be controlled with healthy choices, you may need to take medicine. °This information is not intended to replace advice given to you by your   health care provider. Make sure you discuss any questions you have with your health care provider. °Document Released: 11/20/2007 Document Revised: 05/01/2016 Document Reviewed: 05/01/2016 °Elsevier Interactive Patient Education © 2017 [employer] ° °

## 2016-11-12 NOTE — Progress Notes (Signed)
Subjective:    Patient ID: Belinda Calderon, female    DOB: Jun 20, 1960, 56 y.o.   MRN: [id number]  HPI  Pt presents to the clinic today to follow up HTN and left shoulder pain.  HTN: At her last visit, Amlodipine was added to her Lisinopril-HCT. She has been taking the medication as prescribed. She denies adverse side effects. Her BP today is 106/72.  Left Shoulder Pain: She was treated for a subacromial bursitis with 60 mg Toradol IM and Naproxen 500 mg BID. She reports improvement in her pain.    Review of Systems  Past Medical History:  Diagnosis Date  . Ankylosing spondylitis ([place])   . Degenerative disc disease   . Fibromyalgia   . Hypertension   . IBS (irritable bowel syndrome)     Current Outpatient Prescriptions  Medication Sig Dispense Refill  . amLODipine (NORVASC) 10 MG tablet Take 1 tablet (10 mg total) by mouth daily. 30 tablet 0  . lisinopril-hydrochlorothiazide (PRINZIDE,ZESTORETIC) 20-12.5 MG tablet Take 2 tablets by mouth daily. 60 tablet 0  . naproxen (EC NAPROSYN) 500 MG EC tablet Take 1 tablet (500 mg total) by mouth 2 (two) times daily with a meal. 60 tablet 2  . NON FORMULARY     . promethazine (PHENERGAN) 25 MG tablet Take 25 mg by mouth every 6 (six) hours as needed for nausea or vomiting.     No current facility-administered medications for this visit.     Allergies  Allergen Reactions  . Sulfa Antibiotics Hives and Swelling    Family History  Problem Relation Age of Onset  . Arthritis Mother   . Arthritis Father   . Hyperlipidemia Father   . Stroke Father   . Hypertension Father   . Diabetes Father   . Arthritis Maternal Grandmother   . Arthritis Maternal Grandfather   . Arthritis Paternal Grandmother   . Heart disease Paternal Grandmother   . Stroke Paternal Grandmother   . Hypertension Paternal Grandmother   . Diabetes Paternal Grandmother   . Arthritis Paternal Grandfather   . Hypertension Paternal Grandfather   . Diabetes  Paternal Grandfather     Social History   Social History  . Marital status: Married    Spouse name: N/A  . Number of children: N/A  . Years of education: N/A   Occupational History  . Not on file.   Social History Main Topics  . Smoking status: Never Smoker  . Smokeless tobacco: Never Used  . Alcohol use Yes     Comment: rare  . Drug use: Yes    Types: Marijuana     Comment: Cannabis oil  . Sexual activity: Yes   Other Topics Concern  . Not on file   Social History Narrative  . No narrative on file     Constitutional: Denies fever, malaise, fatigue, headache or abrupt weight changes.  Respiratory: Denies difficulty breathing, shortness of breath, cough or sputum production.   Cardiovascular: Denies chest pain, chest tightness, palpitations or swelling in the hands or feet.  Neurological: Denies dizziness, difficulty with memory, difficulty with speech or problems with balance and coordination.    No other specific complaints in a complete review of systems (except as listed in HPI above).     Objective:   Physical Exam   BP 106/72   Pulse 93   Temp 97.8 F (36.6 C) (Oral)   Wt 217 lb 12 oz (98.8 kg)   SpO2 98%   BMI 34.10 kg/m  Wt Readings from Last 3 Encounters:  11/12/16 217 lb 12 oz (98.8 kg)  10/31/16 219 lb (99.3 kg)  10/17/16 220 lb 8 oz (100 kg)    General: Appears her stated age, well developed, well nourished in NAD. Neck:  Neck supple, trachea midline. No masses, lumps or thyromegaly present.  Cardiovascular: Normal rate and rhythm. S1,S2 noted.  No murmur, rubs or gallops noted.  Neurological: Alert and oriented.    BMET    Component Value Date/Time   NA 141 04/02/2016 1403   K 3.0 (L) 04/02/2016 1403   CL 103 04/02/2016 1403   CO2 29 04/02/2016 1403   GLUCOSE 94 04/02/2016 1403   BUN 10 04/02/2016 1403   CREATININE 0.65 04/02/2016 1403   CALCIUM 9.6 04/02/2016 1403   GFRNONAA >90 09/09/2011 2110   GFRAA >90 09/09/2011 2110     Lipid Panel     Component Value Date/Time   CHOL 172 04/02/2016 1403   TRIG 153.0 (H) 04/02/2016 1403   HDL 38.70 (L) 04/02/2016 1403   CHOLHDL 4 04/02/2016 1403   VLDL 30.6 04/02/2016 1403   LDLCALC 102 (H) 04/02/2016 1403    CBC    Component Value Date/Time   WBC 9.4 04/02/2016 1403   RBC 4.89 04/02/2016 1403   HGB 14.8 04/02/2016 1403   HCT 43.4 04/02/2016 1403   PLT 272.0 04/02/2016 1403   MCV 88.7 04/02/2016 1403   MCH 31.1 09/09/2011 2110   MCHC 34.2 04/02/2016 1403   RDW 13.0 04/02/2016 1403   LYMPHSABS 2.7 09/09/2011 2110   MONOABS 0.9 09/09/2011 2110   EOSABS 1.0 (H) 09/09/2011 2110   BASOSABS 0.0 09/09/2011 2110    Hgb A1C Lab Results  Component Value Date   HGBA1C 5.5 04/02/2016           Assessment & Plan:   Left Shoulder Pain:  Improved Continue Naproxen BID prn  Make an appt for your annual exam

## 2016-11-13 ENCOUNTER — Other Ambulatory Visit: Payer: Self-pay

## 2016-11-13 DIAGNOSIS — M1711 Unilateral primary osteoarthritis, right knee: Secondary | ICD-10-CM | POA: Diagnosis not present

## 2016-11-13 DIAGNOSIS — M25562 Pain in left knee: Secondary | ICD-10-CM | POA: Diagnosis not present

## 2016-11-13 DIAGNOSIS — G8929 Other chronic pain: Secondary | ICD-10-CM | POA: Diagnosis not present

## 2016-11-13 DIAGNOSIS — M25561 Pain in right knee: Secondary | ICD-10-CM | POA: Diagnosis not present

## 2016-11-13 MED ORDER — AMLODIPINE BESYLATE 5 MG PO TABS: 5.0000 mg | ORAL_TABLET | Freq: Every day | ORAL | 3 refills | Status: DC

## 2016-11-13 MED ORDER — LISINOPRIL-HYDROCHLOROTHIAZIDE 20-12.5 MG PO TABS: 2.0000 | ORAL_TABLET | Freq: Every day | ORAL | 3 refills | Status: DC

## 2016-11-13 NOTE — Telephone Encounter (Signed)
OV note from 11/12/16   HTN (hypertension) - Jearld Fenton, NP at 11/12/2016 11:31 AM   Status: Written  Related Problem: HTN (hypertension)    Continue Lisinopril HCT Decrease Norvasc to 5 mg daily     Rx for Norvasc sent to pharmacy at [place] will refill other Rx

## 2016-11-13 NOTE — Telephone Encounter (Signed)
Pt left note requesting refill lisinopril HCTZ and amlodipine 0.5 mg. Per 10/31/16 office note continue lisinopril HCTZ and add norvasc 10 mg daily. Reck 2 wks.Please advise. The med list has amlodipine 10 mg taking 1/2 tab daily.Please advise.

## 2016-11-14 ENCOUNTER — Other Ambulatory Visit: Payer: Self-pay | Admitting: Internal Medicine

## 2016-11-14 NOTE — Telephone Encounter (Signed)
She should continue current dose of Lisinopril HCT and decrease dose to 5 mg daily. Will refill.

## 2016-11-21 DIAGNOSIS — M47816 Spondylosis without myelopathy or radiculopathy, lumbar region: Secondary | ICD-10-CM | POA: Diagnosis not present

## 2016-11-21 DIAGNOSIS — M419 Scoliosis, unspecified: Secondary | ICD-10-CM | POA: Diagnosis not present

## 2016-11-21 DIAGNOSIS — M545 Low back pain: Secondary | ICD-10-CM | POA: Diagnosis not present

## 2016-11-22 ENCOUNTER — Telehealth: Payer: Self-pay

## 2016-11-22 NOTE — Telephone Encounter (Signed)
Patient advised.

## 2016-11-22 NOTE — Telephone Encounter (Signed)
Would be okay to try and she can check her BP as she goes along if needed and update [place] next week.  Thanks.

## 2016-11-22 NOTE — Telephone Encounter (Signed)
Pt left a VM states she went to see her neurologist yesterday and his assistant put her on a "steroid pack" and she was calling to see if she's ok to take it bc "it raise her BP".

## 2016-11-25 NOTE — Telephone Encounter (Signed)
noted 

## 2016-12-03 ENCOUNTER — Other Ambulatory Visit: Payer: Self-pay | Admitting: Nurse Practitioner

## 2016-12-03 DIAGNOSIS — M47816 Spondylosis without myelopathy or radiculopathy, lumbar region: Secondary | ICD-10-CM

## 2016-12-17 ENCOUNTER — Ambulatory Visit
Admission: RE | Admit: 2016-12-17 | Discharge: 2016-12-17 | Disposition: A | Discharge status: Home / Self Care | Payer: Medicare Other | Source: Ambulatory Visit | Attending: Nurse Practitioner | Admitting: Nurse Practitioner

## 2016-12-17 DIAGNOSIS — M5126 Other intervertebral disc displacement, lumbar region: Secondary | ICD-10-CM | POA: Diagnosis not present

## 2016-12-17 DIAGNOSIS — M47816 Spondylosis without myelopathy or radiculopathy, lumbar region: Secondary | ICD-10-CM

## 2016-12-30 DIAGNOSIS — M47816 Spondylosis without myelopathy or radiculopathy, lumbar region: Secondary | ICD-10-CM | POA: Diagnosis not present

## 2016-12-30 DIAGNOSIS — M48061 Spinal stenosis, lumbar region without neurogenic claudication: Secondary | ICD-10-CM | POA: Diagnosis not present

## 2017-02-11 DIAGNOSIS — M47814 Spondylosis without myelopathy or radiculopathy, thoracic region: Secondary | ICD-10-CM | POA: Diagnosis not present

## 2017-02-11 DIAGNOSIS — Z79899 Other long term (current) drug therapy: Secondary | ICD-10-CM | POA: Diagnosis not present

## 2017-02-11 DIAGNOSIS — I10 Essential (primary) hypertension: Secondary | ICD-10-CM | POA: Diagnosis not present

## 2017-02-11 DIAGNOSIS — Z886 Allergy status to analgesic agent status: Secondary | ICD-10-CM | POA: Diagnosis not present

## 2017-02-11 DIAGNOSIS — M797 Fibromyalgia: Secondary | ICD-10-CM | POA: Diagnosis not present

## 2017-02-11 DIAGNOSIS — M1990 Unspecified osteoarthritis, unspecified site: Secondary | ICD-10-CM | POA: Diagnosis not present

## 2017-02-11 DIAGNOSIS — Z882 Allergy status to sulfonamides status: Secondary | ICD-10-CM | POA: Diagnosis not present

## 2017-02-11 DIAGNOSIS — Z538 Procedure and treatment not carried out for other reasons: Secondary | ICD-10-CM | POA: Diagnosis not present

## 2017-02-11 DIAGNOSIS — R9431 Abnormal electrocardiogram [ECG] [EKG]: Secondary | ICD-10-CM | POA: Diagnosis not present

## 2017-02-11 DIAGNOSIS — M4726 Other spondylosis with radiculopathy, lumbar region: Secondary | ICD-10-CM | POA: Diagnosis not present

## 2017-02-11 DIAGNOSIS — M5116 Intervertebral disc disorders with radiculopathy, lumbar region: Secondary | ICD-10-CM | POA: Diagnosis not present

## 2017-02-13 ENCOUNTER — Encounter: Payer: Self-pay | Admitting: Cardiology

## 2017-02-13 ENCOUNTER — Ambulatory Visit (INDEPENDENT_AMBULATORY_CARE_PROVIDER_SITE_OTHER): Payer: Medicare Other | Admitting: Cardiology

## 2017-02-13 VITALS — BP 126/76 | HR 80 | Ht 66.0 in | Wt 216.8 lb

## 2017-02-13 DIAGNOSIS — M4726 Other spondylosis with radiculopathy, lumbar region: Secondary | ICD-10-CM | POA: Diagnosis not present

## 2017-02-13 DIAGNOSIS — I10 Essential (primary) hypertension: Secondary | ICD-10-CM | POA: Diagnosis not present

## 2017-02-13 DIAGNOSIS — Z01818 Encounter for other preprocedural examination: Secondary | ICD-10-CM

## 2017-02-13 DIAGNOSIS — M5116 Intervertebral disc disorders with radiculopathy, lumbar region: Secondary | ICD-10-CM | POA: Diagnosis not present

## 2017-02-13 DIAGNOSIS — M797 Fibromyalgia: Secondary | ICD-10-CM | POA: Diagnosis not present

## 2017-02-13 DIAGNOSIS — Z882 Allergy status to sulfonamides status: Secondary | ICD-10-CM | POA: Diagnosis not present

## 2017-02-13 DIAGNOSIS — R9431 Abnormal electrocardiogram [ECG] [EKG]: Secondary | ICD-10-CM | POA: Diagnosis not present

## 2017-02-13 DIAGNOSIS — Z79899 Other long term (current) drug therapy: Secondary | ICD-10-CM | POA: Diagnosis not present

## 2017-02-13 DIAGNOSIS — M1990 Unspecified osteoarthritis, unspecified site: Secondary | ICD-10-CM | POA: Diagnosis not present

## 2017-02-13 DIAGNOSIS — Z886 Allergy status to analgesic agent status: Secondary | ICD-10-CM | POA: Diagnosis not present

## 2017-02-13 DIAGNOSIS — R0789 Other chest pain: Secondary | ICD-10-CM

## 2017-02-13 DIAGNOSIS — Z538 Procedure and treatment not carried out for other reasons: Secondary | ICD-10-CM | POA: Diagnosis not present

## 2017-02-13 NOTE — Patient Instructions (Signed)
Medication Instructions:  The current medical regimen is effective;  continue present plan and medications.  Testing/Procedures: Your physician has requested that you have a lexiscan myoview. For further information please visit [email or web address]. Please follow instruction sheet, as given.  Follow-Up: Follow up as needed.  If you need a refill on your cardiac medications before your next appointment, please call your pharmacy.  Thank you for choosing [facility]!!

## 2017-02-13 NOTE — Progress Notes (Signed)
Cardiology Office Note:    Date:  02/13/2017   ID:  Belinda Calderon, DOB 14-Jun-1961, MRN [id number]  PCP:  Jearld Fenton, NP  Cardiologist:  Candee Furbish, MD    Referring MD: Jearld Fenton, NP     History of Present Illness:    Belinda Calderon is a 56 y.o. female with a hx of Back pain awaiting surgery here for the evaluation of abnormal EKG with T-wave inversion anterolaterally at the request of Dr. Carloyn Manner. She was scheduled to undergo surgery today but this was canceled after an EKG demonstrated T-wave inversions in the anterolateral leads. Repeat EKG today personally reviewed shows sinus rhythm without any abnormalities. HTN, non smoker.   Chest pain left arm pain. +MJ, Grandmother with CVA.    Past Medical History:  Diagnosis Date  . Ankylosing spondylitis ([place])   . Degenerative disc disease   . Fibromyalgia   . Hypertension   . IBS (irritable bowel syndrome)     Past Surgical History:  Procedure Laterality Date  . ABDOMINAL HYSTERECTOMY     partial  . CESAREAN SECTION    . KNEE SURGERY    . TONSILLECTOMY      Current Medications: Current Meds  Medication Sig  . amLODipine (NORVASC) 5 MG tablet Take 1 tablet (5 mg total) by mouth daily.  Marland Kitchen lisinopril-hydrochlorothiazide (PRINZIDE,ZESTORETIC) 20-12.5 MG tablet Take 2 tablets by mouth daily.  . NON FORMULARY      Allergies:   Sulfa antibiotics   Social History   Social History  . Marital status: Married    Spouse name: N/A  . Number of children: N/A  . Years of education: N/A   Social History Main Topics  . Smoking status: Never Smoker  . Smokeless tobacco: Never Used  . Alcohol use Yes     Comment: rare  . Drug use: Yes    Types: Marijuana     Comment: Cannabis oil  . Sexual activity: Yes   Other Topics Concern  . None   Social History Narrative  . None     Family History: The patient's family history includes Arthritis in her father, maternal grandfather, maternal grandmother, mother,  paternal grandfather, and paternal grandmother; Diabetes in her father, paternal grandfather, and paternal grandmother; Heart disease in her paternal grandmother; Hyperlipidemia in her father; Hypertension in her father, paternal grandfather, and paternal grandmother; Stroke in her father and paternal grandmother. ROS:   Please see the history of present illness.   All other systems reviewed and are negative.  EKGs/Labs/Other Studies Reviewed:    The following studies were reviewed today: Prior office notes, lab work reviewed  EKG:  EKG is  ordered today.  The ekg ordered today demonstrates sinus rhythm with no other abnormalities. Personally viewed. Prior EKG reported as T-wave inversion anterolaterally.  Recent Labs: 04/02/2016: ALT 46; BUN 10; Creatinine, Ser 0.65; Hemoglobin 14.8; Platelets 272.0; Potassium 3.0; Sodium 141  Recent Lipid Panel    Component Value Date/Time   CHOL 172 04/02/2016 1403   TRIG 153.0 (H) 04/02/2016 1403   HDL 38.70 (L) 04/02/2016 1403   CHOLHDL 4 04/02/2016 1403   VLDL 30.6 04/02/2016 1403   LDLCALC 102 (H) 04/02/2016 1403    Physical Exam:    VS:  BP 126/76   Pulse 80   Ht 5\' 6"  (1.676 m)   Wt 216 lb 12.8 oz (98.3 kg)   LMP  (LMP Unknown)   BMI 34.99 kg/m     Wt Readings  from Last 3 Encounters:  02/13/17 216 lb 12.8 oz (98.3 kg)  11/12/16 217 lb 12 oz (98.8 kg)  10/31/16 219 lb (99.3 kg)     GEN:  Well nourished, well developed in no acute distress HEENT: Normal NECK: No JVD; No carotid bruits LYMPHATICS: No lymphadenopathy CARDIAC: RRR, no murmurs, rubs, gallops RESPIRATORY:  Clear to auscultation without rales, wheezing or rhonchi  ABDOMEN: Soft, non-tender, non-distended MUSCULOSKELETAL:  No edema; No deformity  SKIN: Warm and dry NEUROLOGIC:  Alert and oriented x 3 PSYCHIATRIC:  Normal affect   ASSESSMENT:    1. Pre-op examination   2. EKG, abnormal   3. Atypical chest pain    PLAN:    In order of problems listed  above:  Preoperative risk stratification in the setting of abnormal EKG/atypical chest pain  - Back pain. Dr. Carloyn Manner. Surgery was canceled because of T-wave inversions on ECG.  - Set up for pharmacologic stress test. She has back pain and would not be able to walk on treadmill. This will provide further risk stratification. She does have occasional atypical left-sided chest pain and left arm pain. Prior bursitis in the past. She has been diagnosed with fibromyalgia in the past.  She admits that she utilizes marijuana use for pain.     Medication Adjustments/Labs and Tests Ordered: Current medicines are reviewed at length with the patient today.  Concerns regarding medicines are outlined above.  Orders Placed This Encounter  Procedures  . Myocardial Perfusion Imaging  . EKG 12-Lead   No orders of the defined types were placed in this encounter.   Signed, Candee Furbish, MD  02/13/2017 4:28 PM    [place]

## 2017-02-18 ENCOUNTER — Ambulatory Visit (HOSPITAL_COMMUNITY): Payer: Medicare Other | Attending: Cardiology

## 2017-02-18 DIAGNOSIS — R9431 Abnormal electrocardiogram [ECG] [EKG]: Secondary | ICD-10-CM | POA: Diagnosis not present

## 2017-02-18 DIAGNOSIS — Z01818 Encounter for other preprocedural examination: Secondary | ICD-10-CM | POA: Diagnosis not present

## 2017-02-18 LAB — MYOCARDIAL PERFUSION IMAGING
LV sys vol: 52 mL
LVDIAVOL: 106 mL (ref 46–106)
NUC STRESS TID: 0.92
Peak HR: 120 {beats}/min
RATE: 0.3
Rest HR: 75 {beats}/min
SDS: 2
SRS: 1
SSS: 3

## 2017-02-18 MED ORDER — REGADENOSON 0.4 MG/5ML IV SOLN
0.4000 mg | Freq: Once | INTRAVENOUS | Status: AC
  Administered 2017-02-18: 0.4 mg via INTRAVENOUS

## 2017-02-18 MED ORDER — TECHNETIUM TC 99M TETROFOSMIN IV KIT
32.6000 | PACK | Freq: Once | INTRAVENOUS | Status: AC | PRN
  Administered 2017-02-18: 32.6 via INTRAVENOUS
  Filled 2017-02-18: qty 33

## 2017-02-18 MED ORDER — TECHNETIUM TC 99M TETROFOSMIN IV KIT
10.2000 | PACK | Freq: Once | INTRAVENOUS | Status: AC | PRN
  Administered 2017-02-18: 10.2 via INTRAVENOUS
  Filled 2017-02-18: qty 11

## 2017-02-18 MED ORDER — AMINOPHYLLINE 25 MG/ML IV SOLN
75.0000 mg | Freq: Once | INTRAVENOUS | Status: AC
  Administered 2017-02-18: 75 mg via INTRAVENOUS

## 2017-02-24 ENCOUNTER — Telehealth: Payer: Self-pay | Admitting: Cardiology

## 2017-02-24 NOTE — Telephone Encounter (Signed)
New message    Pt is calling asking for a call back. She is calling to find out about being cleared for surgery.

## 2017-02-24 NOTE — Telephone Encounter (Signed)
Notes recorded by Loren Racer, LPN on 02/18/9446 at 3:95 PM EDT Informed pt of results. Results sent to Dr.Roy. Pt verbalized understanding and was appreciative for call. ------  Notes recorded by Jerline Pain, MD on 02/18/2017 at 2:27 PM EDT Low risk study, no ischemia EF 51% OK to proceed with back surgery Please send to Dr. Ree Shay, MD

## 2017-02-24 NOTE — Telephone Encounter (Signed)
Clearance has been faxed to Dr Roy's office however the patient is reporting they have not received it.  Advised I will refax it.  She was appreciative of the call and the assistance.

## 2017-03-05 ENCOUNTER — Telehealth: Payer: Self-pay | Admitting: Cardiology

## 2017-03-05 NOTE — Telephone Encounter (Signed)
Notes recorded by Jerline Pain, MD on 02/18/2017 at 2:27 PM EDT Low risk study, no ischemia EF 51% OK to proceed with back surgery Please send to Dr. Ree Shay, MD

## 2017-03-05 NOTE — Telephone Encounter (Signed)
New message         [facility] Pre-operative Risk Assessment    Request for surgical clearance:  1. What type of surgery is being performed?  Lumbar fusion  2. When is this surgery scheduled?  Pending clearance  Are there any medications that need to be held prior to surgery and how long? They received office notes, however, they need a note saying "pt is cleared to have lumbar fusion surgery" specifically 3. Name of physician performing surgery?  Dr Carloyn Manner  What is your office phone and fax number?  Fax [phone] Chauncey Reading Price 03/05/2017, 9:54 AM  _________________________________________________________________   (provider comments below)

## 2017-03-05 NOTE — Telephone Encounter (Signed)
Clearance routed to Dr. Carloyn Manner via EP[facility].

## 2017-03-10 DIAGNOSIS — M25561 Pain in right knee: Secondary | ICD-10-CM | POA: Diagnosis not present

## 2017-03-10 DIAGNOSIS — M1711 Unilateral primary osteoarthritis, right knee: Secondary | ICD-10-CM | POA: Diagnosis not present

## 2017-03-10 DIAGNOSIS — M25562 Pain in left knee: Secondary | ICD-10-CM | POA: Diagnosis not present

## 2017-03-10 DIAGNOSIS — G8929 Other chronic pain: Secondary | ICD-10-CM | POA: Diagnosis not present

## 2017-03-13 ENCOUNTER — Other Ambulatory Visit: Payer: Self-pay | Admitting: Internal Medicine

## 2017-03-14 NOTE — Telephone Encounter (Signed)
CPE reminder letter mailed 

## 2017-03-21 ENCOUNTER — Other Ambulatory Visit: Payer: Self-pay | Admitting: Internal Medicine

## 2017-04-08 ENCOUNTER — Telehealth: Payer: Self-pay | Admitting: Internal Medicine

## 2017-04-08 ENCOUNTER — Encounter: Payer: Medicare Other | Admitting: Internal Medicine

## 2017-04-08 NOTE — Telephone Encounter (Signed)
Copied from [place] #[id number]. Topic: Appointment Scheduling - Scheduling Inquiry for Clinic >> Apr 08, 2017  9:00 AM Belinda Calderon I, NT wrote: Reason for CRM: Pt Cancelled CPE for today because pt. Sister is actively dying. Pt. RS'd appt to 12/4. Pt said she has enough medication until CPE.

## 2017-04-21 DIAGNOSIS — R918 Other nonspecific abnormal finding of lung field: Secondary | ICD-10-CM | POA: Diagnosis not present

## 2017-04-24 DIAGNOSIS — M545 Low back pain: Secondary | ICD-10-CM | POA: Diagnosis not present

## 2017-04-24 DIAGNOSIS — Z79899 Other long term (current) drug therapy: Secondary | ICD-10-CM | POA: Diagnosis not present

## 2017-04-24 DIAGNOSIS — M4807 Spinal stenosis, lumbosacral region: Secondary | ICD-10-CM | POA: Diagnosis not present

## 2017-04-24 DIAGNOSIS — M797 Fibromyalgia: Secondary | ICD-10-CM | POA: Diagnosis not present

## 2017-04-24 DIAGNOSIS — M4726 Other spondylosis with radiculopathy, lumbar region: Secondary | ICD-10-CM | POA: Diagnosis not present

## 2017-04-24 DIAGNOSIS — Z833 Family history of diabetes mellitus: Secondary | ICD-10-CM | POA: Diagnosis not present

## 2017-04-24 DIAGNOSIS — I959 Hypotension, unspecified: Secondary | ICD-10-CM | POA: Diagnosis not present

## 2017-04-24 DIAGNOSIS — M5127 Other intervertebral disc displacement, lumbosacral region: Secondary | ICD-10-CM | POA: Diagnosis not present

## 2017-04-24 DIAGNOSIS — M47816 Spondylosis without myelopathy or radiculopathy, lumbar region: Secondary | ICD-10-CM | POA: Diagnosis not present

## 2017-04-24 DIAGNOSIS — Z809 Family history of malignant neoplasm, unspecified: Secondary | ICD-10-CM | POA: Diagnosis not present

## 2017-04-24 DIAGNOSIS — M48061 Spinal stenosis, lumbar region without neurogenic claudication: Secondary | ICD-10-CM | POA: Diagnosis not present

## 2017-04-24 DIAGNOSIS — M47896 Other spondylosis, lumbar region: Secondary | ICD-10-CM | POA: Diagnosis not present

## 2017-04-24 DIAGNOSIS — I10 Essential (primary) hypertension: Secondary | ICD-10-CM | POA: Diagnosis not present

## 2017-04-24 DIAGNOSIS — R509 Fever, unspecified: Secondary | ICD-10-CM | POA: Diagnosis not present

## 2017-04-24 DIAGNOSIS — M4716 Other spondylosis with myelopathy, lumbar region: Secondary | ICD-10-CM | POA: Diagnosis not present

## 2017-04-24 DIAGNOSIS — Z79891 Long term (current) use of opiate analgesic: Secondary | ICD-10-CM | POA: Diagnosis not present

## 2017-04-24 DIAGNOSIS — Z881 Allergy status to other antibiotic agents status: Secondary | ICD-10-CM | POA: Diagnosis not present

## 2017-04-24 DIAGNOSIS — M1990 Unspecified osteoarthritis, unspecified site: Secondary | ICD-10-CM | POA: Diagnosis not present

## 2017-04-24 DIAGNOSIS — Z820 Family history of epilepsy and other diseases of the nervous system: Secondary | ICD-10-CM | POA: Diagnosis not present

## 2017-04-24 DIAGNOSIS — Z8249 Family history of ischemic heart disease and other diseases of the circulatory system: Secondary | ICD-10-CM | POA: Diagnosis not present

## 2017-04-24 HISTORY — PX: SPINE SURGERY: SHX786

## 2017-04-30 DIAGNOSIS — M47896 Other spondylosis, lumbar region: Secondary | ICD-10-CM | POA: Diagnosis not present

## 2017-04-30 DIAGNOSIS — I10 Essential (primary) hypertension: Secondary | ICD-10-CM | POA: Diagnosis not present

## 2017-04-30 DIAGNOSIS — Z4789 Encounter for other orthopedic aftercare: Secondary | ICD-10-CM | POA: Diagnosis not present

## 2017-04-30 DIAGNOSIS — Z981 Arthrodesis status: Secondary | ICD-10-CM | POA: Diagnosis not present

## 2017-05-02 DIAGNOSIS — M47816 Spondylosis without myelopathy or radiculopathy, lumbar region: Secondary | ICD-10-CM | POA: Diagnosis not present

## 2017-05-02 DIAGNOSIS — Z981 Arthrodesis status: Secondary | ICD-10-CM | POA: Diagnosis not present

## 2017-05-07 DIAGNOSIS — I10 Essential (primary) hypertension: Secondary | ICD-10-CM | POA: Diagnosis not present

## 2017-05-07 DIAGNOSIS — M47896 Other spondylosis, lumbar region: Secondary | ICD-10-CM | POA: Diagnosis not present

## 2017-05-07 DIAGNOSIS — Z4789 Encounter for other orthopedic aftercare: Secondary | ICD-10-CM | POA: Diagnosis not present

## 2017-05-07 DIAGNOSIS — Z981 Arthrodesis status: Secondary | ICD-10-CM | POA: Diagnosis not present

## 2017-05-15 DIAGNOSIS — Z4789 Encounter for other orthopedic aftercare: Secondary | ICD-10-CM | POA: Diagnosis not present

## 2017-05-15 DIAGNOSIS — M47816 Spondylosis without myelopathy or radiculopathy, lumbar region: Secondary | ICD-10-CM | POA: Insufficient documentation

## 2017-05-15 DIAGNOSIS — M47896 Other spondylosis, lumbar region: Secondary | ICD-10-CM | POA: Diagnosis not present

## 2017-05-15 DIAGNOSIS — I10 Essential (primary) hypertension: Secondary | ICD-10-CM | POA: Diagnosis not present

## 2017-05-15 DIAGNOSIS — Z981 Arthrodesis status: Secondary | ICD-10-CM | POA: Diagnosis not present

## 2017-05-20 ENCOUNTER — Encounter: Payer: Self-pay | Admitting: Internal Medicine

## 2017-05-20 ENCOUNTER — Ambulatory Visit (INDEPENDENT_AMBULATORY_CARE_PROVIDER_SITE_OTHER): Payer: Medicare Other | Admitting: Internal Medicine

## 2017-05-20 VITALS — BP 120/78 | HR 73 | Temp 98.2°F | Ht 67.0 in | Wt 218.0 lb

## 2017-05-20 DIAGNOSIS — I10 Essential (primary) hypertension: Secondary | ICD-10-CM

## 2017-05-20 DIAGNOSIS — K582 Mixed irritable bowel syndrome: Secondary | ICD-10-CM | POA: Diagnosis not present

## 2017-05-20 DIAGNOSIS — M459 Ankylosing spondylitis of unspecified sites in spine: Secondary | ICD-10-CM | POA: Diagnosis not present

## 2017-05-20 DIAGNOSIS — Z0000 Encounter for general adult medical examination without abnormal findings: Secondary | ICD-10-CM

## 2017-05-20 DIAGNOSIS — M797 Fibromyalgia: Secondary | ICD-10-CM | POA: Diagnosis not present

## 2017-05-20 DIAGNOSIS — M1711 Unilateral primary osteoarthritis, right knee: Secondary | ICD-10-CM

## 2017-05-20 NOTE — Patient Instructions (Signed)
Health Maintenance for Postmenopausal Women Menopause is a normal process in which your reproductive ability comes to an end. This process happens gradually over a span of months to years, usually between the ages of 22 and 9. Menopause is complete when you have missed 12 consecutive menstrual periods. It is important to talk with your health care provider about some of the most common conditions that affect postmenopausal women, such as heart disease, cancer, and bone loss (osteoporosis). Adopting a healthy lifestyle and getting preventive care can help to promote your health and wellness. Those actions can also lower your chances of developing some of these common conditions. What should I know about menopause? During menopause, you may experience a number of symptoms, such as:  Moderate-to-severe hot flashes.  Night sweats.  Decrease in sex drive.  Mood swings.  Headaches.  Tiredness.  Irritability.  Memory problems.  Insomnia.  Choosing to treat or not to treat menopausal changes is an individual decision that you make with your health care provider. What should I know about hormone replacement therapy and supplements? Hormone therapy products are effective for treating symptoms that are associated with menopause, such as hot flashes and night sweats. Hormone replacement carries certain risks, especially as you become older. If you are thinking about using estrogen or estrogen with progestin treatments, discuss the benefits and risks with your health care provider. What should I know about heart disease and stroke? Heart disease, heart attack, and stroke become more likely as you age. This may be due, in part, to the hormonal changes that your body experiences during menopause. These can affect how your body processes dietary fats, triglycerides, and cholesterol. Heart attack and stroke are both medical emergencies. There are many things that you can do to help prevent heart disease  and stroke:  Have your blood pressure checked at least every 1-2 years. High blood pressure causes heart disease and increases the risk of stroke.  If you are 53-22 years old, ask your health care provider if you should take aspirin to prevent a heart attack or a stroke.  Do not use any tobacco products, including cigarettes, chewing tobacco, or electronic cigarettes. If you need help quitting, ask your health care provider.  It is important to eat a healthy diet and maintain a healthy weight. ? Be sure to include plenty of vegetables, fruits, low-fat dairy products, and lean protein. ? Avoid eating foods that are high in solid fats, added sugars, or salt (sodium).  Get regular exercise. This is one of the most important things that you can do for your health. ? Try to exercise for at least 150 minutes each week. The type of exercise that you do should increase your heart rate and make you sweat. This is known as moderate-intensity exercise. ? Try to do strengthening exercises at least twice each week. Do these in addition to the moderate-intensity exercise.  Know your numbers.Ask your health care provider to check your cholesterol and your blood glucose. Continue to have your blood tested as directed by your health care provider.  What should I know about cancer screening? There are several types of cancer. Take the following steps to reduce your risk and to catch any cancer development as early as possible. Breast Cancer  Practice breast self-awareness. ? This means understanding how your breasts normally appear and feel. ? It also means doing regular breast self-exams. Let your health care provider know about any changes, no matter how small.  If you are 40  or older, have a clinician do a breast exam (clinical breast exam or CBE) every year. Depending on your age, family history, and medical history, it may be recommended that you also have a yearly breast X-ray (mammogram).  If you  have a family history of breast cancer, talk with your health care provider about genetic screening.  If you are at high risk for breast cancer, talk with your health care provider about having an MRI and a mammogram every year.  Breast cancer (BRCA) gene test is recommended for women who have family members with BRCA-related cancers. Results of the assessment will determine the need for genetic counseling and BRCA1 and for BRCA2 testing. BRCA-related cancers include these types: ? Breast. This occurs in males or females. ? Ovarian. ? Tubal. This may also be called fallopian tube cancer. ? Cancer of the abdominal or pelvic lining (peritoneal cancer). ? Prostate. ? Pancreatic.  Cervical, Uterine, and Ovarian Cancer Your health care provider may recommend that you be screened regularly for cancer of the pelvic organs. These include your ovaries, uterus, and vagina. This screening involves a pelvic exam, which includes checking for microscopic changes to the surface of your cervix (Pap test).  For women ages 21-65, health care providers may recommend a pelvic exam and a Pap test every three years. For women ages 73-65, they may recommend the Pap test and pelvic exam, combined with testing for human papilloma virus (HPV), every five years. Some types of HPV increase your risk of cervical cancer. Testing for HPV may also be done on women of any age who have unclear Pap test results.  Other health care providers may not recommend any screening for nonpregnant women who are considered low risk for pelvic cancer and have no symptoms. Ask your health care provider if a screening pelvic exam is right for you.  If you have had past treatment for cervical cancer or a condition that could lead to cancer, you need Pap tests and screening for cancer for at least 20 years after your treatment. If Pap tests have been discontinued for you, your risk factors (such as having a new sexual partner) need to be  reassessed to determine if you should start having screenings again. Some women have medical problems that increase the chance of getting cervical cancer. In these cases, your health care provider may recommend that you have screening and Pap tests more often.  If you have a family history of uterine cancer or ovarian cancer, talk with your health care provider about genetic screening.  If you have vaginal bleeding after reaching menopause, tell your health care provider.  There are currently no reliable tests available to screen for ovarian cancer.  Lung Cancer Lung cancer screening is recommended for adults 11-66 years old who are at high risk for lung cancer because of a history of smoking. A yearly low-dose CT scan of the lungs is recommended if you:  Currently smoke.  Have a history of at least 30 pack-years of smoking and you currently smoke or have quit within the past 15 years. A pack-year is smoking an average of one pack of cigarettes per day for one year.  Yearly screening should:  Continue until it has been 15 years since you quit.  Stop if you develop a health problem that would prevent you from having lung cancer treatment.  Colorectal Cancer  This type of cancer can be detected and can often be prevented.  Routine colorectal cancer screening usually begins at  age 42 and continues through age 45.  If you have risk factors for colon cancer, your health care provider may recommend that you be screened at an earlier age.  If you have a family history of colorectal cancer, talk with your health care provider about genetic screening.  Your health care provider may also recommend using home test kits to check for hidden blood in your stool.  A small camera at the end of a tube can be used to examine your colon directly (sigmoidoscopy or colonoscopy). This is done to check for the earliest forms of colorectal cancer.  Direct examination of the colon should be repeated every  5-10 years until age 71. However, if early forms of precancerous polyps or small growths are found or if you have a family history or genetic risk for colorectal cancer, you may need to be screened more often.  Skin Cancer  Check your skin from head to toe regularly.  Monitor any moles. Be sure to tell your health care provider: ? About any new moles or changes in moles, especially if there is a change in a mole's shape or color. ? If you have a mole that is larger than the size of a pencil eraser.  If any of your family members has a history of skin cancer, especially at a young age, talk with your health care provider about genetic screening.  Always use sunscreen. Apply sunscreen liberally and repeatedly throughout the day.  Whenever you are outside, protect yourself by wearing long sleeves, pants, a wide-brimmed hat, and sunglasses.  What should I know about osteoporosis? Osteoporosis is a condition in which bone destruction happens more quickly than new bone creation. After menopause, you may be at an increased risk for osteoporosis. To help prevent osteoporosis or the bone fractures that can happen because of osteoporosis, the following is recommended:  If you are 46-71 years old, get at least 1,000 mg of calcium and at least 600 mg of vitamin D per day.  If you are older than age 55 but younger than age 65, get at least 1,200 mg of calcium and at least 600 mg of vitamin D per day.  If you are older than age 54, get at least 1,200 mg of calcium and at least 800 mg of vitamin D per day.  Smoking and excessive alcohol intake increase the risk of osteoporosis. Eat foods that are rich in calcium and vitamin D, and do weight-bearing exercises several times each week as directed by your health care provider. What should I know about how menopause affects my mental health? Depression may occur at any age, but it is more common as you become older. Common symptoms of depression  include:  Low or sad mood.  Changes in sleep patterns.  Changes in appetite or eating patterns.  Feeling an overall lack of motivation or enjoyment of activities that you previously enjoyed.  Frequent crying spells.  Talk with your health care provider if you think that you are experiencing depression. What should I know about immunizations? It is important that you get and maintain your immunizations. These include:  Tetanus, diphtheria, and pertussis (Tdap) booster vaccine.  Influenza every year before the flu season begins.  Pneumonia vaccine.  Shingles vaccine.  Your health care provider may also recommend other immunizations. This information is not intended to replace advice given to you by your health care provider. Make sure you discuss any questions you have with your health care provider. Document Released: 07/26/2005  Document Revised: 12/22/2015 Document Reviewed: 03/07/2015 Elsevier Interactive Patient Education  2018 [employer]  

## 2017-05-20 NOTE — Progress Notes (Signed)
Subjective:    Patient ID: Belinda Calderon, female    DOB: [birth year], 56 y.o.   MRN: [id number]  HPI  Pt presents to the clinic today for her [facility] Exam. She is also due to follow up chronic conditions.  Ankylosing Spondylitis: She reports she recently had back surgery. She is taking Hydrocodone and Zanaflex as prescribed. She has been doing PT. She follows with Dr. Carloyn Manner,.  IBS: Mainly diarrhea, but since she has started taking the Hydrocodone, she has been having trouble with constipation as well. She denies blood in her stools.  Fibromyalgia: She has chronic muscle pain, all over. She treats her pain with CBD oil.  OA, Knee: Chronic. She follows with Dr. Alvan Dame. She gets steroid injections intermittently.  HTN: Her BP today is 120/78. She gets very anxious anytime she comes to the doctor. She is taking Amlodipine and Lisinopril HCT as prescribed. ECG From reviewed.  Hospitiliaztions: 04/2017 at [place], back surgery  Health Maintenance:    Flu: never  Tetanus: 08/2011  Mammogram: 2013  Pap Smear: 2013  Bone Density: 2013  Colon Screening: Cologuard, 04/2016  Eye Doctor: annually  Dental Exam: as needed   Providers:   PCP: Webb Silversmith, NP-C  Neurosurgeon: Dr. Carloyn Manner  Orthopedist: Dr. Alvan Dame  I have personally reviewed and have noted:  1. The patient's medical and social history 2. Their use of alcohol, tobacco or illicit drugs 3. Their current medications and supplements 4. The patient's functional ability including ADL's, fall risks, home safety risks and hearing or visual impairment. 5. Diet and physical activities 6. Evidence for depression or mood disorder    Review of Systems      Past Medical History:  Diagnosis Date  . Ankylosing spondylitis ([place])   . Degenerative disc disease   . Fibromyalgia   . Hypertension   . IBS (irritable bowel syndrome)     Current Outpatient Medications  Medication Sig Dispense Refill  . amLODipine  (NORVASC) 5 MG tablet Take 1 tablet (5 mg total) by mouth daily. 90 tablet 0  . lisinopril-hydrochlorothiazide (PRINZIDE,ZESTORETIC) 20-12.5 MG tablet Take 2 tablets by mouth daily. MUST SCHEDULE ANNUAL PHYSICAL 180 tablet 0  . NON FORMULARY      No current facility-administered medications for this visit.     Allergies  Allergen Reactions  . Sulfa Antibiotics Hives and Swelling    Family History  Problem Relation Age of Onset  . Arthritis Mother   . Arthritis Father   . Hyperlipidemia Father   . Stroke Father   . Hypertension Father   . Diabetes Father   . Arthritis Maternal Grandmother   . Arthritis Maternal Grandfather   . Arthritis Paternal Grandmother   . Heart disease Paternal Grandmother   . Stroke Paternal Grandmother   . Hypertension Paternal Grandmother   . Diabetes Paternal Grandmother   . Arthritis Paternal Grandfather   . Hypertension Paternal Grandfather   . Diabetes Paternal Grandfather     Social History   Socioeconomic History  . Marital status: Married    Spouse name: Not on file  . Number of children: Not on file  . Years of education: Not on file  . Highest education level: Not on file  Social Needs  . Financial resource strain: Not on file  . Food insecurity - worry: Not on file  . Food insecurity - inability: Not on file  . Transportation needs - medical: Not on file  . Transportation needs - non-medical: Not on  file  Occupational History  . Not on file  Tobacco Use  . Smoking status: Never Smoker  . Smokeless tobacco: Never Used  Substance and Sexual Activity  . Alcohol use: Yes    Comment: rare  . Drug use: Yes    Types: Marijuana    Comment: Cannabis oil  . Sexual activity: Yes  Other Topics Concern  . Not on file  Social History Narrative  . Not on file     Constitutional: Denies fever, malaise, fatigue, headache or abrupt weight changes.  HEENT: Denies eye pain, eye redness, ear pain, ringing in the ears, wax buildup, runny  nose, nasal congestion, bloody nose, or sore throat. Respiratory: Denies difficulty breathing, shortness of breath, cough or sputum production.   Cardiovascular: Denies chest pain, chest tightness, palpitations or swelling in the hands or feet.  Gastrointestinal: Pt reports constipation and diarrhea. Denies abdominal pain, bloating, or blood in the stool.  GU: Denies urgency, frequency, pain with urination, burning sensation, blood in urine, odor or discharge. Musculoskeletal: Pt reports back pain. Denies joint swelling.  Skin: Denies redness, rashes, lesions or ulcercations.  Neurological: Denies dizziness, difficulty with memory, difficulty with speech or problems with balance and coordination.  Psych: Pt has a history of anxiety. Denies depression, SI/HI.  No other specific complaints in a complete review of systems (except as listed in HPI above).' Objective:   Physical Exam    BP 120/78   Pulse 73   Temp 98.2 F (36.8 C) (Oral)   Ht 5\' 7"  (1.702 m)   Wt 218 lb (98.9 kg)   LMP  (LMP Unknown)   SpO2 98%   BMI 34.14 kg/m  Wt Readings from Last 3 Encounters:  05/20/17 218 lb (98.9 kg)  02/18/17 216 lb (98 kg)  02/13/17 216 lb 12.8 oz (98.3 kg)    General: Appears her stated age, obese in NAD. Skin: Warm, dry and intact.  HEENT: Head: normal shape and size; Eyes: sclera white, no icterus, conjunctiva pink, PERRLA and EOMs intact; Ears: Tm's gray and intact, normal light reflex; Throat/Mouth: Teeth present, mucosa pink and moist, no exudate, lesions or ulcerations noted.  Neck:  Neck supple, trachea midline. No masses, lumps or thyromegaly present.  Cardiovascular: Normal rate and rhythm. S1,S2 noted.  No murmur, rubs or gallops noted. No JVD or BLE edema. No carotid bruits noted. Pulmonary/Chest: Normal effort and positive vesicular breath sounds. No respiratory distress. No wheezes, rales or ronchi noted.  Abdomen: Soft and nontender. Normal bowel sounds. Unable to do complete  exam as she feels she can not get up on the table. Musculoskeletal: Wearing back brace. Walking with walker. Strength 5/5 BUE. Strength 4/5 RLE, Strength 5/5 LLE. Neurological: Alert and oriented. Coordination normal.  Psychiatric: Mood and affect normal. Behavior is normal. Judgment and thought content normal.     BMET    Component Value Date/Time   NA 141 04/02/2016 1403   K 3.0 (L) 04/02/2016 1403   CL 103 04/02/2016 1403   CO2 29 04/02/2016 1403   GLUCOSE 94 04/02/2016 1403   BUN 10 04/02/2016 1403   CREATININE 0.65 04/02/2016 1403   CALCIUM 9.6 04/02/2016 1403   GFRNONAA >90 09/09/2011 2110   GFRAA >90 09/09/2011 2110    Lipid Panel     Component Value Date/Time   CHOL 172 04/02/2016 1403   TRIG 153.0 (H) 04/02/2016 1403   HDL 38.70 (L) 04/02/2016 1403   CHOLHDL 4 04/02/2016 1403   VLDL 30.6 04/02/2016 1403  LDLCALC 102 (H) 04/02/2016 1403    CBC    Component Value Date/Time   WBC 9.4 04/02/2016 1403   RBC 4.89 04/02/2016 1403   HGB 14.8 04/02/2016 1403   HCT 43.4 04/02/2016 1403   PLT 272.0 04/02/2016 1403   MCV 88.7 04/02/2016 1403   MCH 31.1 09/09/2011 2110   MCHC 34.2 04/02/2016 1403   RDW 13.0 04/02/2016 1403   LYMPHSABS 2.7 09/09/2011 2110   MONOABS 0.9 09/09/2011 2110   EOSABS 1.0 (H) 09/09/2011 2110   BASOSABS 0.0 09/09/2011 2110    Hgb A1C Lab Results  Component Value Date   HGBA1C 5.5 04/02/2016          Assessment & Plan:    Medicare Annual Wellness Visit:  Diet: She does eat meat. She consumes fruits and veggies. She does eat fried foods. She drinks mostly water, tea. Physical activity: She is doing PT right now, but no regular exercise. Depression/mood screen: Negative Hearing: Intact to whispered voice Visual acuity: Grossly normal, performs annual eye exam  ADLs: Capable, but needs some assist right now due to recent surgery. Fall risk: High right now due to recent surgery, using walker for assistance Home safety:  Good Cognitive evaluation: Intact to orientation, naming, recall and repetition EOL planning: No adv directives, full code/ I agree  Preventative Medicine: She declines flu shot. Tetanus UTD. She declines pelvic exam (does not need pap smears), mammogram or bone density. Colon screening UTD. Encouraged her to consume a balanced diet and exercise regimen. Encouraged her to see an eye doctor and dentist annually. She declines labs today.   Next appointment: 1 year, Medicare Wellness Exam   Webb Silversmith, NP

## 2017-05-20 NOTE — Assessment & Plan Note (Signed)
S/p surgery She is doing PT She will wear brace as advised by Dr. Carloyn Manner She will follow up with Dr. Carloyn Manner as previously scheduled Continue Hydrocodone and Zanaflex as needed (not prescribed by me)

## 2017-05-20 NOTE — Assessment & Plan Note (Signed)
Chronic but stable with use of CBD oil Encouraged her to stay active, try to get some routine exercise in once she is released by neurosurgery

## 2017-05-20 NOTE — Assessment & Plan Note (Signed)
Currently doing okay She will see Dr. Alvan Dame again when she needs another knee injection

## 2017-05-20 NOTE — Assessment & Plan Note (Signed)
Slight worse recently secondary to narcotics for back surgery Discussed the importance of a high fiber diet and lots of water Advised her she can take stool softeners as needed

## 2017-05-20 NOTE — Assessment & Plan Note (Signed)
Controlled on Lisinopril HCT and Amlodipine She declines lab work today Will monitor

## 2017-05-21 DIAGNOSIS — Z981 Arthrodesis status: Secondary | ICD-10-CM | POA: Diagnosis not present

## 2017-05-21 DIAGNOSIS — M47896 Other spondylosis, lumbar region: Secondary | ICD-10-CM | POA: Diagnosis not present

## 2017-05-21 DIAGNOSIS — Z4789 Encounter for other orthopedic aftercare: Secondary | ICD-10-CM | POA: Diagnosis not present

## 2017-05-21 DIAGNOSIS — I10 Essential (primary) hypertension: Secondary | ICD-10-CM | POA: Diagnosis not present

## 2017-07-05 ENCOUNTER — Ambulatory Visit (INDEPENDENT_AMBULATORY_CARE_PROVIDER_SITE_OTHER): Payer: Medicare Other | Admitting: Nurse Practitioner

## 2017-07-05 ENCOUNTER — Encounter: Payer: Self-pay | Admitting: Nurse Practitioner

## 2017-07-05 DIAGNOSIS — F329 Major depressive disorder, single episode, unspecified: Secondary | ICD-10-CM | POA: Insufficient documentation

## 2017-07-05 DIAGNOSIS — F332 Major depressive disorder, recurrent severe without psychotic features: Secondary | ICD-10-CM | POA: Diagnosis not present

## 2017-07-05 DIAGNOSIS — F32A Depression, unspecified: Secondary | ICD-10-CM | POA: Insufficient documentation

## 2017-07-05 DIAGNOSIS — F419 Anxiety disorder, unspecified: Secondary | ICD-10-CM | POA: Insufficient documentation

## 2017-07-05 MED ORDER — SERTRALINE HCL 50 MG PO TABS: 50.0000 mg | ORAL_TABLET | Freq: Every day | ORAL | 0 refills | Status: DC

## 2017-07-05 MED ORDER — BUSPIRONE HCL 30 MG PO TABS: 30.0000 mg | ORAL_TABLET | Freq: Two times a day (BID) | ORAL | 0 refills | Status: DC

## 2017-07-05 NOTE — Progress Notes (Signed)
Subjective:  Patient ID: Belinda Calderon, female    DOB: [birth year]  Age: 57 y.o. MRN: [id number]  CC: Depression  Accompanied by husband.  Depression         This is a recurrent problem.  The current episode started more than 1 year ago.   The onset quality is gradual.   The problem occurs daily.  The problem has been waxing and waning since onset.  Associated symptoms include hopelessness, insomnia, irritable, restlessness, decreased interest, myalgias and sad.  Associated symptoms include no decreased concentration, no fatigue, no helplessness, no appetite change, no body aches, no headaches and no indigestion.  Reports thoughts of hurting herself, but says she has no intention of doing so. " if I really wanted to hurt myself, I have several medications at home I could use. I am here because I need to get back on medication. I am tired of feeling depressed and angry. I will like to talk to someone. I do not want to go to hospital. I do not want to see a psychiatrist"  Reports mood has worsen over the last 22month due to uncontrolled pain and death of sister.  Denies any previous inpatient behavioral health admission, no previous suicide attempt. Denies any hallucinations  Reports she was seen by [facility] in past. diagnosed with depression, PTSD and anxiety. Medications used in past: wellbutrin, prozac, lexapro, clonazepam. Unable to tolerate prozac in past (caused increased anxiety).  Husband reports there are no guns at home.  Denies ETOH or illicit drug use.  Outpatient Medications Prior to Visit  Medication Sig Dispense Refill  . amLODipine (NORVASC) 5 MG tablet Take 1 tablet (5 mg total) by mouth daily. 90 tablet 0  . gabapentin (NEURONTIN) 300 MG capsule TAKE 1 CAPSULE BY MOUTH THREE TIMES A DAY  1  . HYDROcodone-acetaminophen (NORCO) 10-325 MG tablet Take 1 tablet by mouth every 4 (four) hours as needed. for pain  0  . lisinopril-hydrochlorothiazide (PRINZIDE,ZESTORETIC)  20-12.5 MG tablet Take 2 tablets by mouth daily. MUST SCHEDULE ANNUAL PHYSICAL 180 tablet 0  . NON FORMULARY     . tiZANidine (ZANAFLEX) 4 MG tablet Take 1 tablet by mouth every 8 (eight) hours.     No facility-administered medications prior to visit.     ROS See HPI  Objective:  BP 110/80   Pulse 76   Temp 98 F (36.7 C) (Oral)   Resp 14   Ht 5\' 7"  (1.702 m)   Wt 210 lb (95.3 kg)   LMP  (LMP Unknown)   SpO2 99%   BMI 32.89 kg/m   BP Readings from Last 3 Encounters:  07/05/17 110/80  05/20/17 120/78  02/13/17 126/76    Wt Readings from Last 3 Encounters:  07/05/17 210 lb (95.3 kg)  05/20/17 218 lb (98.9 kg)  02/18/17 216 lb (98 kg)    Physical Exam  Constitutional: She is irritable. She appears distressed.  Cardiovascular: Normal rate.  Pulmonary/Chest: Effort normal.  Psychiatric: Judgment and thought content normal. Belinda Calderon mood appears anxious. She is agitated. Cognition and memory are normal. She exhibits a depressed mood. She expresses no suicidal plans and no homicidal plans.  Vitals reviewed.   Lab Results  Component Value Date   WBC 9.4 04/02/2016   HGB 14.8 04/02/2016   HCT 43.4 04/02/2016   PLT 272.0 04/02/2016   GLUCOSE 94 04/02/2016   CHOL 172 04/02/2016   TRIG 153.0 (H) 04/02/2016   HDL 38.70 (L) 04/02/2016   LDLCALC 102 (  H) 04/02/2016   ALT 46 (H) 04/02/2016   AST 40 (H) 04/02/2016   NA 141 04/02/2016   K 3.0 (L) 04/02/2016   CL 103 04/02/2016   CREATININE 0.65 04/02/2016   BUN 10 04/02/2016   CO2 29 04/02/2016   INR 1.03 02/01/2010   HGBA1C 5.5 04/02/2016    Mr Lumbar Spine Wo Contrast  Result Date: 12/17/2016 CLINICAL DATA:  Chronic low back pain. Urinary frequency and urgency for 2 months. EXAM: MRI LUMBAR SPINE WITHOUT CONTRAST TECHNIQUE: Multiplanar, multisequence MR imaging of the lumbar spine was performed. No intravenous contrast was administered. COMPARISON:  Plain films lumbar spine 11/21/2016. MRI lumbar spine 02/16/2014.  FINDINGS: Segmentation:  Standard. Alignment: There is convex left scoliosis with the apex at L3-4. Trace retrolisthesis L1 on L2 and L2 on L3 is noted. Vertebrae: No fracture or worrisome lesion. Scattered anterior endplate spurring is most notable in the lower thoracic spine. Conus medullaris: Extends to the L1 level and appears normal. Paraspinal and other soft tissues: Negative. Disc levels: T10-11: Facet degenerative change.  Otherwise negative. T11-12: Negative. Small cysts about the exiting nerve roots are incidentally noted, unchanged. T12-L1:  Negative. L1-2: Facet degenerative disease is identified. Minimal disc bulge without central canal or foraminal stenosis, unchanged. L2-3: Mild to moderate facet degenerative change and a shallow disc bulge without central canal or foraminal stenosis. The appearance is unchanged. L3-4: Advanced bilateral facet degenerative change, ligamentum flavum thickening and a disc bulge are identified. Moderately severe to severe right foraminal narrowing appears worse than on the prior examination. Moderate left foraminal narrowing and moderate to moderately severe central canal stenosis are unchanged. L4-5: Advanced facet degenerative disease, shallow disc bulge and ligamentum flavum thickening are identified. Mild to moderate central canal stenosis and bilateral foraminal narrowing, worse on the left, appear unchanged. L5-S1: Large broad-based right subarticular recess and foraminal protrusion with endplate spur appears unchanged. Disc could irritate both the exiting right L5 and descending right S1 roots. Mild left foraminal narrowing is noted. IMPRESSION: Moderately severe to severe right foraminal narrowing at L3-4 appears worse than on the prior MRI. Moderate left and moderate to moderately severe central canal stenosis at L3-4 are stable in appearance. No change in narrowing in the right subarticular recess and foramen at L5-S1 due to a broad-based protrusion with  endplate spurring. No change in mild to moderate central canal stenosis and bilateral foraminal narrowing at L4-5. Electronically Signed   By: Inge Rise M.D.   On: 12/17/2016 18:22    Assessment & Plan:   Belinda Calderon was seen today for depression.  Diagnoses and all orders for this visit:  Severe episode of recurrent major depressive disorder, without psychotic features ([place]) -     busPIRone (BUSPAR) 30 MG tablet; Take 1 tablet (30 mg total) by mouth 2 (two) times daily. -     sertraline (ZOLOFT) 50 MG tablet; Take 1 tablet (50 mg total) by mouth daily. -     Ambulatory referral to Psychology   I am having Autry R. Renzulli start on busPIRone and sertraline. I am also having Belinda Calderon maintain Belinda Calderon NON FORMULARY, lisinopril-hydrochlorothiazide, amLODipine, HYDROcodone-acetaminophen, tiZANidine, and gabapentin.  Meds ordered this encounter  Medications  . busPIRone (BUSPAR) 30 MG tablet    Sig: Take 1 tablet (30 mg total) by mouth 2 (two) times daily.    Dispense:  30 tablet    Refill:  0    Order Specific Question:   Supervising Provider    Answer:   Anitra Lauth,  PHILIP H [[id number]]  . sertraline (ZOLOFT) 50 MG tablet    Sig: Take 1 tablet (50 mg total) by mouth daily.    Dispense:  30 tablet    Refill:  0    Order Specific Question:   Supervising Provider    Answer:   Tammi Sou [[id number]]    Follow-up: Return in about 1 week (around 07/12/2017) for with pcp.  Wilfred Lacy, NP

## 2017-07-05 NOTE — Patient Instructions (Addendum)
I instructed pt to start zoloft 1/2 tablet once daily for 1 week and then increase to a full tablet once daily on week two as tolerated.   We discussed common side effects such as nausea, drowsiness and weight gain.  Also discussed rare but serious side effect of suicide ideation.  She is instructed to discontinue medication go directly to ED if this occurs.  Pt verbalizes understanding.   Plan follow up in 1 week to evaluate progress.    Advised patient and husband about ED precaution( worsening mood). She verbally agreed to call 911 if mood worsens. Husband or any family member is to stay with patient at all times.  Need to see pcp in 1week.  Major Depressive Disorder, Adult Major depressive disorder (MDD) is a mental health condition. MDD often makes you feel sad, hopeless, or helpless. MDD can also cause symptoms in your body. MDD can affect your:  Work.  School.  Relationships.  Other normal activities.  MDD can range from mild to very bad. It may occur once (single episode MDD). It can also occur many times (recurrent MDD). The main symptoms of MDD often include:  Feeling sad, depressed, or irritable most of the time.  Loss of interest.  MDD symptoms also include:  Sleeping too much or too little.  Eating too much or too little.  A change in your weight.  Feeling tired (fatigue) or having low energy.  Feeling worthless.  Feeling guilty.  Trouble making decisions.  Trouble thinking clearly.  Thoughts of suicide or harming others.  Feeling weak.  Feeling agitated.  Keeping yourself from being around other people (isolation).  Follow these instructions at home: Activity  Do these things as told by your doctor: ? Go back to your normal activities. ? Exercise regularly. ? Spend time outdoors. Alcohol  Talk with your doctor about how alcohol can affect your antidepressant medicines.  Do not drink alcohol. Or, limit how much alcohol you drink. ? This  means no more than 1 drink a day for nonpregnant women and 2 drinks a day for men. One drink equals one of these:  12 oz of beer.  5 oz of wine.  1 oz of hard liquor. General instructions  Take over-the-counter and prescription medicines only as told by your doctor.  Eat a healthy diet.  Get plenty of sleep.  Find activities that you enjoy. Make time to do them.  Think about joining a support group. Your doctor may be able to suggest a group for you.  Keep all follow-up visits as told by your doctor. This is important. Where to find more information:  [employer] on Mental Illness: ? www.nami.[place]: ? https://carter.com/  National Suicide Prevention Lifeline: ? 516-486-0232. This is free, 24-hour help. Contact a doctor if:  Your symptoms get worse.  You have new symptoms. Get help right away if:  You self-harm.  You see, hear, taste, smell, or feel things that are not present (hallucinate). If you ever feel like you may hurt yourself or others, or have thoughts about taking your own life, get help right away. You can go to your nearest emergency department or call:  Your local emergency services (911 in the [place]  A suicide crisis helpline, such as the National Suicide Prevention Lifeline: ? 704-300-4718. This is open 24 hours a day.  This information is not intended to replace advice given to you by your health care provider. Make sure you discuss  any questions you have with your health care provider. Document Released: 05/15/2015 Document Revised: 02/18/2016 Document Reviewed: 02/18/2016 Elsevier Interactive Patient Education  2017 [employer].

## 2017-07-09 ENCOUNTER — Telehealth: Payer: Self-pay | Admitting: Internal Medicine

## 2017-07-09 MED ORDER — AMLODIPINE BESYLATE 5 MG PO TABS: 5.0000 mg | ORAL_TABLET | Freq: Every day | ORAL | 3 refills | Status: DC

## 2017-07-09 MED ORDER — LISINOPRIL-HYDROCHLOROTHIAZIDE 20-12.5 MG PO TABS: 2.0000 | ORAL_TABLET | Freq: Every day | ORAL | 3 refills | Status: DC

## 2017-07-09 NOTE — Telephone Encounter (Signed)
Refill for amlodipine 5 mg and lisinopril HCTZ 20-12.5 mg done per protocol to [facility]. Pt notified refills done and voiced understanding. Pt also wanted to know that she was seen 07/05/17 at [facility] for "nervous breakdown"; pt has seen neurologist who increased gabapentin. Pt cannot afford to come in for 1 wk f/u after being seen at [facility] but pt said she is doing a lot better and will f/u at a later time with Avie Echevaria NP. Juluis Rainier to Avie Echevaria NP.

## 2017-07-09 NOTE — Telephone Encounter (Signed)
Rx refill

## 2017-07-09 NOTE — Telephone Encounter (Signed)
Copied from [place]. Topic: Quick Communication - See Telephone Encounter >> Jul 09, 2017 10:56 AM Ahmed Prima L wrote: CRM for notification. See Telephone encounter for:   07/09/17.  [facility] needs approval for lisinopril-hydrochlorothiazide (PRINZIDE,ZESTORETIC) 20-12.5 MG tablet & amLODipine (NORVASC) 5 MG tablet.   Call back is [phone] reference [id number]

## 2017-07-09 NOTE — Telephone Encounter (Signed)
Noted, thanks for the update 

## 2017-07-25 DIAGNOSIS — M13861 Other specified arthritis, right knee: Secondary | ICD-10-CM | POA: Diagnosis not present

## 2017-07-25 DIAGNOSIS — M25561 Pain in right knee: Secondary | ICD-10-CM | POA: Diagnosis not present

## 2017-08-01 ENCOUNTER — Other Ambulatory Visit: Payer: Self-pay | Admitting: Nurse Practitioner

## 2017-08-01 DIAGNOSIS — F332 Major depressive disorder, recurrent severe without psychotic features: Secondary | ICD-10-CM

## 2017-08-01 NOTE — Telephone Encounter (Signed)
This was not prescribed by me, I had no idea she was taking this. Is she seeing someone else for this? If she wants me to prescribe, she needs to make an appt with me to discuss

## 2017-08-04 NOTE — Telephone Encounter (Signed)
Called pt, no answer and no VM 

## 2017-08-05 ENCOUNTER — Ambulatory Visit: Payer: Medicare Other | Admitting: Internal Medicine

## 2017-08-05 ENCOUNTER — Telehealth: Payer: Self-pay

## 2017-08-05 NOTE — Telephone Encounter (Signed)
Patient No Showed/ Canceled appt within 24 hours.   Please determine one of the following actions:   A. No follow-up necessary.  B. Follow-up urgent. Locate patient immediately.  C. Follow-up necessary. Contact patient and schedule visit in _______ days.  D. Follow-up advised. Contact patient and schedule visit in _______ weeks.    Please determine CHARGE or NO CHARGE.   Pt cancelled 08/05/17 at 9:45 appt on 08/05/17 at 8:30 and no reason given. Pt rescheduled for med refill visit on 08/08/17 at 4:30. The 08/05/17 9:45 slot has been filled.Please advise.

## 2017-08-05 NOTE — Telephone Encounter (Signed)
Copied from [place] [id number]. Topic: Quick Communication - Appointment Cancellation >> Aug 05, 2017  8:33 AM Carolyn Stare wrote: Patient called to cancel appointment scheduled for today 08/05/17  Patient HAS  rescheduled their appointment for 08/08/17    Route to department's PEC pool.

## 2017-08-05 NOTE — Telephone Encounter (Signed)
No charge needed

## 2017-08-08 ENCOUNTER — Encounter: Payer: Self-pay | Admitting: Internal Medicine

## 2017-08-08 ENCOUNTER — Ambulatory Visit (INDEPENDENT_AMBULATORY_CARE_PROVIDER_SITE_OTHER): Payer: Medicare Other | Admitting: Internal Medicine

## 2017-08-08 VITALS — BP 120/70 | HR 77 | Temp 98.3°F | Ht 67.0 in | Wt 210.4 lb

## 2017-08-08 DIAGNOSIS — F329 Major depressive disorder, single episode, unspecified: Secondary | ICD-10-CM

## 2017-08-08 DIAGNOSIS — F419 Anxiety disorder, unspecified: Secondary | ICD-10-CM | POA: Diagnosis not present

## 2017-08-08 MED ORDER — HYDROXYZINE HCL 10 MG PO TABS: 10.0000 mg | ORAL_TABLET | Freq: Every day | ORAL | 0 refills | Status: DC | PRN

## 2017-08-08 NOTE — Progress Notes (Signed)
Subjective:    Patient ID: Belinda Calderon, female    DOB: 1960-10-06, 57 y.o.   MRN: [id number]  HPI  Pt presents to the clinic today to follow up anxiety and depression. She saw Wilfred Lacy NP for the same 06/2017. She was started on Sertraline and Buspar. She reports she only took one dose of the medication, and will no longer take it. She feels like the medication made her have memory trouble, jittery and much worse than she was feeling in the first place. Her anxiety and depression are triggered by chronic pain. She has been smoking weed and reports that seems to control her anxiety, depression and pain better than anything.     Review of Systems      Past Medical History:  Diagnosis Date  . Ankylosing spondylitis ([place])   . Degenerative disc disease   . Fibromyalgia   . Hypertension   . IBS (irritable bowel syndrome)     Current Outpatient Medications  Medication Sig Dispense Refill  . amLODipine (NORVASC) 5 MG tablet Take 1 tablet (5 mg total) by mouth daily. 90 tablet 3  . busPIRone (BUSPAR) 30 MG tablet Take 1 tablet (30 mg total) by mouth 2 (two) times daily. 30 tablet 0  . gabapentin (NEURONTIN) 300 MG capsule TAKE 1 CAPSULE BY MOUTH THREE TIMES A DAY  1  . HYDROcodone-acetaminophen (NORCO) 10-325 MG tablet Take 1 tablet by mouth every 4 (four) hours as needed. for pain  0  . lisinopril-hydrochlorothiazide (PRINZIDE,ZESTORETIC) 20-12.5 MG tablet Take 2 tablets by mouth daily. 180 tablet 3  . NON FORMULARY     . sertraline (ZOLOFT) 50 MG tablet Take 1 tablet (50 mg total) by mouth daily. 30 tablet 0  . tiZANidine (ZANAFLEX) 4 MG tablet Take 1 tablet by mouth every 8 (eight) hours.     No current facility-administered medications for this visit.     Allergies  Allergen Reactions  . Sulfa Antibiotics Hives and Swelling    Family History  Problem Relation Age of Onset  . Arthritis Mother   . Arthritis Father   . Hyperlipidemia Father   . Stroke Father   .  Hypertension Father   . Diabetes Father   . Arthritis Maternal Grandmother   . Arthritis Maternal Grandfather   . Arthritis Paternal Grandmother   . Heart disease Paternal Grandmother   . Stroke Paternal Grandmother   . Hypertension Paternal Grandmother   . Diabetes Paternal Grandmother   . Arthritis Paternal Grandfather   . Hypertension Paternal Grandfather   . Diabetes Paternal Grandfather     Social History   Socioeconomic History  . Marital status: Married    Spouse name: Not on file  . Number of children: Not on file  . Years of education: Not on file  . Highest education level: Not on file  Social Needs  . Financial resource strain: Not on file  . Food insecurity - worry: Not on file  . Food insecurity - inability: Not on file  . Transportation needs - medical: Not on file  . Transportation needs - non-medical: Not on file  Occupational History  . Not on file  Tobacco Use  . Smoking status: Never Smoker  . Smokeless tobacco: Never Used  Substance and Sexual Activity  . Alcohol use: Yes    Comment: rare  . Drug use: Yes    Types: Marijuana    Comment: Cannabis oil  . Sexual activity: Yes  Other Topics Concern  .  Not on file  Social History Narrative  . Not on file     Constitutional: Denies fever, malaise, fatigue, headache or abrupt weight changes.  Psych: Pt reports anxiety and depression. Denies SI/HI.  No other specific complaints in a complete review of systems (except as listed in HPI above).  Objective:   Physical Exam   BP 120/70 (BP Location: Right Arm, Patient Position: Sitting, Cuff Size: Normal)   Pulse 77   Temp 98.3 F (36.8 C) (Oral)   Ht 5\' 7"  (1.702 m)   Wt 210 lb 6.4 oz (95.4 kg)   LMP  (LMP Unknown)   SpO2 98%   BMI 32.95 kg/m  Wt Readings from Last 3 Encounters:  08/08/17 210 lb 6.4 oz (95.4 kg)  07/05/17 210 lb (95.3 kg)  05/20/17 218 lb (98.9 kg)    General: Appears her stated age, in NAD. Neurological: Alert and  oriented.   Psychiatric: Mood and affect normal. Behavior is normal. Judgment and thought content normal.    BMET    Component Value Date/Time   NA 141 04/02/2016 1403   K 3.0 (L) 04/02/2016 1403   CL 103 04/02/2016 1403   CO2 29 04/02/2016 1403   GLUCOSE 94 04/02/2016 1403   BUN 10 04/02/2016 1403   CREATININE 0.65 04/02/2016 1403   CALCIUM 9.6 04/02/2016 1403   GFRNONAA >90 09/09/2011 2110   GFRAA >90 09/09/2011 2110    Lipid Panel     Component Value Date/Time   CHOL 172 04/02/2016 1403   TRIG 153.0 (H) 04/02/2016 1403   HDL 38.70 (L) 04/02/2016 1403   CHOLHDL 4 04/02/2016 1403   VLDL 30.6 04/02/2016 1403   LDLCALC 102 (H) 04/02/2016 1403    CBC    Component Value Date/Time   WBC 9.4 04/02/2016 1403   RBC 4.89 04/02/2016 1403   HGB 14.8 04/02/2016 1403   HCT 43.4 04/02/2016 1403   PLT 272.0 04/02/2016 1403   MCV 88.7 04/02/2016 1403   MCH 31.1 09/09/2011 2110   MCHC 34.2 04/02/2016 1403   RDW 13.0 04/02/2016 1403   LYMPHSABS 2.7 09/09/2011 2110   MONOABS 0.9 09/09/2011 2110   EOSABS 1.0 (H) 09/09/2011 2110   BASOSABS 0.0 09/09/2011 2110    Hgb A1C Lab Results  Component Value Date   HGBA1C 5.5 04/02/2016           Assessment & Plan:   Anxiety and Depression:  She is against taking medication due to side effects She declines referral to therapy at this time Support offered today Will monitor for now  Return precautions discussed Webb Silversmith, NP

## 2017-08-08 NOTE — Patient Instructions (Signed)

## 2017-08-11 ENCOUNTER — Encounter: Payer: Self-pay | Admitting: Internal Medicine

## 2017-09-08 ENCOUNTER — Other Ambulatory Visit: Payer: Self-pay | Admitting: Internal Medicine

## 2017-09-09 DIAGNOSIS — M47816 Spondylosis without myelopathy or radiculopathy, lumbar region: Secondary | ICD-10-CM | POA: Diagnosis not present

## 2017-09-09 NOTE — Telephone Encounter (Signed)
Last filled 08/08/17... Please advise

## 2017-10-14 ENCOUNTER — Encounter: Payer: Self-pay | Admitting: Internal Medicine

## 2017-10-14 ENCOUNTER — Ambulatory Visit (INDEPENDENT_AMBULATORY_CARE_PROVIDER_SITE_OTHER): Payer: Medicare Other | Admitting: Internal Medicine

## 2017-10-14 VITALS — BP 120/80 | HR 78 | Temp 98.0°F | Wt 210.0 lb

## 2017-10-14 DIAGNOSIS — M791 Myalgia, unspecified site: Secondary | ICD-10-CM | POA: Diagnosis not present

## 2017-10-14 DIAGNOSIS — R5383 Other fatigue: Secondary | ICD-10-CM | POA: Diagnosis not present

## 2017-10-14 DIAGNOSIS — M797 Fibromyalgia: Secondary | ICD-10-CM

## 2017-10-14 DIAGNOSIS — R059 Cough, unspecified: Secondary | ICD-10-CM

## 2017-10-14 DIAGNOSIS — R05 Cough: Secondary | ICD-10-CM

## 2017-10-14 DIAGNOSIS — M2550 Pain in unspecified joint: Secondary | ICD-10-CM

## 2017-10-14 LAB — COMPREHENSIVE METABOLIC PANEL
ALT: 20 U/L (ref 0–35)
AST: 20 U/L (ref 0–37)
Albumin: 4.2 g/dL (ref 3.5–5.2)
Alkaline Phosphatase: 76 U/L (ref 39–117)
BUN: 13 mg/dL (ref 6–23)
CHLORIDE: 102 meq/L (ref 96–112)
CO2: 30 meq/L (ref 19–32)
Calcium: 9.3 mg/dL (ref 8.4–10.5)
Creatinine, Ser: 0.59 mg/dL (ref 0.40–1.20)
GFR: 111.91 mL/min (ref >60.00)
GLUCOSE: 105 mg/dL — AB (ref 70–99)
POTASSIUM: 3.4 meq/L — AB (ref 3.5–5.1)
Sodium: 140 mEq/L (ref 135–145)
Total Bilirubin: 0.6 mg/dL (ref 0.2–1.2)
Total Protein: 6.9 g/dL (ref 6.0–8.3)

## 2017-10-14 LAB — CBC
HEMATOCRIT: 38.9 % (ref 36.0–46.0)
HEMOGLOBIN: 13.2 g/dL (ref 12.0–15.0)
MCHC: 33.9 g/dL (ref 30.0–36.0)
MCV: 90.6 fl (ref 78.0–100.0)
Platelets: 290 10*3/uL (ref 150.0–400.0)
RBC: 4.29 Mil/uL (ref 3.87–5.11)
RDW: 13.3 % (ref 11.5–15.5)
WBC: 7.5 10*3/uL (ref 4.0–10.5)

## 2017-10-14 LAB — VITAMIN B12: Vitamin B-12: 699 pg/mL (ref 211–911)

## 2017-10-14 LAB — VITAMIN D 25 HYDROXY (VIT D DEFICIENCY, FRACTURES): VITD: 24.2 ng/mL — AB (ref 30.00–100.00)

## 2017-10-14 LAB — TSH: TSH: 0.72 u[IU]/mL (ref 0.35–4.50)

## 2017-10-14 NOTE — Progress Notes (Signed)
Subjective:    Patient ID: Belinda Calderon, female    DOB: May 11, 1961, 57 y.o.   MRN: [id number]  HPI  Pt reports cough and chest congestion. This started 3 weeks ago.  The cough is productive of yellow mucus.  She denies runny nose, nasal congestion, ear pain sore throat.  She reports mild shortness of breath, which seems to be improving. She has tried vinegar and ginger tea, and elderberry syrup with some relief.  She has had sick contacts with similar symptoms.  She is also of fatigue and joint pains.  She reports that 3 weeks ago she pulled 3 ticks off of her.  She does not think that they were engorged, and she denies rash, nausea, vomiting or diarrhea.  She is concerned about tickborne illnesses, or that her vitamins.  She would like this checked. She does have a history of fibromyalgia.  Review of Systems      Past Medical History:  Diagnosis Date  . Ankylosing spondylitis ([place])   . Degenerative disc disease   . Fibromyalgia   . Hypertension   . IBS (irritable bowel syndrome)     Current Outpatient Medications  Medication Sig Dispense Refill  . amLODipine (NORVASC) 5 MG tablet Take 1 tablet (5 mg total) by mouth daily. 90 tablet 3  . busPIRone (BUSPAR) 30 MG tablet Take 1 tablet (30 mg total) by mouth 2 (two) times daily. 30 tablet 0  . gabapentin (NEURONTIN) 300 MG capsule TAKE 1 CAPSULE BY MOUTH THREE TIMES A DAY  1  . HYDROcodone-acetaminophen (NORCO) 10-325 MG tablet Take 1 tablet by mouth every 4 (four) hours as needed. for pain  0  . hydrOXYzine (ATARAX/VISTARIL) 10 MG tablet TAKE 1 TABLET (10 MG TOTAL) BY MOUTH DAILY AS NEEDED. 30 tablet 0  . lisinopril-hydrochlorothiazide (PRINZIDE,ZESTORETIC) 20-12.5 MG tablet Take 2 tablets by mouth daily. 180 tablet 3  . NON FORMULARY     . sertraline (ZOLOFT) 50 MG tablet Take 1 tablet (50 mg total) by mouth daily. 30 tablet 0  . tiZANidine (ZANAFLEX) 4 MG tablet Take 1 tablet by mouth every 8 (eight) hours.     No current  facility-administered medications for this visit.     Allergies  Allergen Reactions  . Sulfa Antibiotics Hives and Swelling    Family History  Problem Relation Age of Onset  . Arthritis Mother   . Arthritis Father   . Hyperlipidemia Father   . Stroke Father   . Hypertension Father   . Diabetes Father   . Arthritis Maternal Grandmother   . Arthritis Maternal Grandfather   . Arthritis Paternal Grandmother   . Heart disease Paternal Grandmother   . Stroke Paternal Grandmother   . Hypertension Paternal Grandmother   . Diabetes Paternal Grandmother   . Arthritis Paternal Grandfather   . Hypertension Paternal Grandfather   . Diabetes Paternal Grandfather     Social History   Socioeconomic History  . Marital status: Married    Spouse name: Not on file  . Number of children: Not on file  . Years of education: Not on file  . Highest education level: Not on file  Occupational History  . Not on file  Social Needs  . Financial resource strain: Not on file  . Food insecurity:    Worry: Not on file    Inability: Not on file  . Transportation needs:    Medical: Not on file    Non-medical: Not on file  Tobacco Use  .  Smoking status: Never Smoker  . Smokeless tobacco: Never Used  Substance and Sexual Activity  . Alcohol use: Yes    Comment: rare  . Drug use: Yes    Types: Marijuana    Comment: Cannabis oil  . Sexual activity: Yes  Lifestyle  . Physical activity:    Days per week: Not on file    Minutes per session: Not on file  . Stress: Not on file  Relationships  . Social connections:    Talks on phone: Not on file    Gets together: Not on file    Attends religious service: Not on file    Active member of club or organization: Not on file    Attends meetings of clubs or organizations: Not on file    Relationship status: Not on file  . Intimate partner violence:    Fear of current or ex partner: Not on file    Emotionally abused: Not on file    Physically  abused: Not on file    Forced sexual activity: Not on file  Other Topics Concern  . Not on file  Social History Narrative  . Not on file     Constitutional: Pt reports fatigue. Denies fever, malaise, headache or abrupt weight changes.  HEENT: Denies eye pain, eye redness, ear pain, ringing in the ears, wax buildup, runny nose, nasal congestion, bloody nose, or sore throat. Respiratory: Pt reports cough and shortness of breath. Denies difficulty breathing.   Cardiovascular: Denies chest pain, chest tightness, palpitations or swelling in the hands or feet.  Gastrointestinal: Denies abdominal pain, bloating, constipation, diarrhea or blood in the stool.  GU: Denies urgency, frequency, pain with urination, burning sensation, blood in urine, odor or discharge. Musculoskeletal: Pt reports joint pain and chronic muscle pain. Denies decrease in range of motion, difficulty with gait, or joint swelling.  Skin: Denies redness, rashes, lesions or ulcercations.  Neurological: Denies dizziness, difficulty with memory, difficulty with speech or problems with balance and coordination.    No other specific complaints in a complete review of systems (except as listed in HPI above).  Objective:   Physical Exam  BP 120/80   Pulse 78   Temp 98 F (36.7 C) (Oral)   Wt 210 lb (95.3 kg)   LMP  (LMP Unknown)   SpO2 97%   BMI 32.89 kg/m  Wt Readings from Last 3 Encounters:  10/14/17 210 lb (95.3 kg)  08/08/17 210 lb 6.4 oz (95.4 kg)  07/05/17 210 lb (95.3 kg)    General: Appears her stated age, obese in NAD. Skin: Warm, dry and intact. No rashes noted. HEENT: Head: normal shape and size, no sinus tendernessnoted;  Ears: Tm's gray and intact, normal light reflex; Nose: mucosa pink and moist, septum midline; Throat/Mouth: Teeth present, mucosa pink and moist, no exudate, lesions or ulcerations noted.  Neck:  No adenopathy noted. Cardiovascular: Normal rate and rhythm.  Pulmonary/Chest: Normal effort  and positive vesicular breath sounds. No respiratory distress. No wheezes, rales or ronchi noted.  Musculoskeletal: No signs of joint swelling. No difficulty with gait.  Neurological: Alert and oriented.    BMET    Component Value Date/Time   NA 141 04/02/2016 1403   K 3.0 (L) 04/02/2016 1403   CL 103 04/02/2016 1403   CO2 29 04/02/2016 1403   GLUCOSE 94 04/02/2016 1403   BUN 10 04/02/2016 1403   CREATININE 0.65 04/02/2016 1403   CALCIUM 9.6 04/02/2016 1403   GFRNONAA >90 09/09/2011 2110  GFRAA >90 09/09/2011 2110    Lipid Panel     Component Value Date/Time   CHOL 172 04/02/2016 1403   TRIG 153.0 (H) 04/02/2016 1403   HDL 38.70 (L) 04/02/2016 1403   CHOLHDL 4 04/02/2016 1403   VLDL 30.6 04/02/2016 1403   LDLCALC 102 (H) 04/02/2016 1403    CBC    Component Value Date/Time   WBC 9.4 04/02/2016 1403   RBC 4.89 04/02/2016 1403   HGB 14.8 04/02/2016 1403   HCT 43.4 04/02/2016 1403   PLT 272.0 04/02/2016 1403   MCV 88.7 04/02/2016 1403   MCH 31.1 09/09/2011 2110   MCHC 34.2 04/02/2016 1403   RDW 13.0 04/02/2016 1403   LYMPHSABS 2.7 09/09/2011 2110   MONOABS 0.9 09/09/2011 2110   EOSABS 1.0 (H) 09/09/2011 2110   BASOSABS 0.0 09/09/2011 2110    Hgb A1C Lab Results  Component Value Date   HGBA1C 5.5 04/02/2016            Assessment & Plan:   Cough:  Likely viral or allergy She is not interested in treatment with pharmaceutical agents Advised her to try peppermint, eucalyptus and elderberry  Fatigue, Joint Pains:  Could be fibromyalgia flare Encouraged rest Will check CBC, CMET, TSH, Vit D, B12, RMSF and B. Burgdorferi today  Will follow up after labs,return precautions discussed Webb Silversmith, NP

## 2017-10-15 NOTE — Patient Instructions (Signed)

## 2017-10-16 LAB — ROCKY MTN SPOTTED FVR ABS PNL(IGG+IGM)
RMSF IGM: NOT DETECTED
RMSF IgG: NOT DETECTED

## 2017-10-17 LAB — B. BURGDORFI ANTIBODIES BY WB
B burgdorferi IgG Abs (IB): NEGATIVE
B burgdorferi IgM Abs (IB): NEGATIVE
LYME DISEASE 18 KD IGG: NONREACTIVE
LYME DISEASE 23 KD IGG: NONREACTIVE
LYME DISEASE 30 KD IGG: NONREACTIVE
LYME DISEASE 39 KD IGM: NONREACTIVE
LYME DISEASE 41 KD IGM: NONREACTIVE
LYME DISEASE 93 KD IGG: NONREACTIVE
Lyme Disease 23 kD IgM: NONREACTIVE
Lyme Disease 28 kD IgG: NONREACTIVE
Lyme Disease 39 kD IgG: NONREACTIVE
Lyme Disease 41 kD IgG: NONREACTIVE
Lyme Disease 45 kD IgG: NONREACTIVE
Lyme Disease 58 kD IgG: NONREACTIVE
Lyme Disease 66 kD IgG: NONREACTIVE

## 2017-10-22 ENCOUNTER — Telehealth: Payer: Self-pay

## 2017-10-22 NOTE — Telephone Encounter (Signed)
Patient came by and dropped off her handicap form to be filled out. Placed form in [place] tower. Patient would like a call when this is ready and patient stated we can leave a detailed message on [phone] if she does not answer. Thank Edrick Kins, RMA

## 2017-10-24 NOTE — Telephone Encounter (Signed)
Pt calling back about handicap form for her to pick up she states that someone had called her but didn't want to leave a message call pt when form is ready for pick up at [phone]

## 2017-10-26 ENCOUNTER — Encounter: Payer: Self-pay | Admitting: Internal Medicine

## 2017-10-27 NOTE — Telephone Encounter (Signed)
Copied from [place] [id number]. Topic: General - Other >> Oct 27, 2017 10:23 AM Yvette Rack wrote: Reason for CRM: Pt called in requesting an update on the handicapped form. Pt states it has been a week now and she is going out of town and really needs the form to be completed. Please call pt back regarding handicapped form.

## 2017-10-28 DIAGNOSIS — M47816 Spondylosis without myelopathy or radiculopathy, lumbar region: Secondary | ICD-10-CM | POA: Diagnosis not present

## 2017-10-28 DIAGNOSIS — M545 Low back pain: Secondary | ICD-10-CM | POA: Diagnosis not present

## 2017-10-28 DIAGNOSIS — Z981 Arthrodesis status: Secondary | ICD-10-CM | POA: Diagnosis not present

## 2017-11-12 DIAGNOSIS — H524 Presbyopia: Secondary | ICD-10-CM | POA: Diagnosis not present

## 2017-11-12 DIAGNOSIS — H35359 Cystoid macular degeneration, unspecified eye: Secondary | ICD-10-CM | POA: Diagnosis not present

## 2017-11-19 DIAGNOSIS — M25561 Pain in right knee: Secondary | ICD-10-CM | POA: Diagnosis not present

## 2017-11-19 DIAGNOSIS — M13861 Other specified arthritis, right knee: Secondary | ICD-10-CM | POA: Diagnosis not present

## 2017-11-19 DIAGNOSIS — M79651 Pain in right thigh: Secondary | ICD-10-CM | POA: Diagnosis not present

## 2017-11-28 DIAGNOSIS — M47816 Spondylosis without myelopathy or radiculopathy, lumbar region: Secondary | ICD-10-CM | POA: Diagnosis not present

## 2018-02-24 DIAGNOSIS — M47816 Spondylosis without myelopathy or radiculopathy, lumbar region: Secondary | ICD-10-CM | POA: Diagnosis not present

## 2018-02-24 DIAGNOSIS — Z981 Arthrodesis status: Secondary | ICD-10-CM | POA: Diagnosis not present

## 2018-02-25 DIAGNOSIS — X32XXXD Exposure to sunlight, subsequent encounter: Secondary | ICD-10-CM | POA: Diagnosis not present

## 2018-02-25 DIAGNOSIS — L570 Actinic keratosis: Secondary | ICD-10-CM | POA: Diagnosis not present

## 2018-02-25 DIAGNOSIS — D0371 Melanoma in situ of right lower limb, including hip: Secondary | ICD-10-CM | POA: Diagnosis not present

## 2018-02-25 DIAGNOSIS — Z1283 Encounter for screening for malignant neoplasm of skin: Secondary | ICD-10-CM | POA: Diagnosis not present

## 2018-02-25 DIAGNOSIS — D225 Melanocytic nevi of trunk: Secondary | ICD-10-CM | POA: Diagnosis not present

## 2018-02-25 DIAGNOSIS — L821 Other seborrheic keratosis: Secondary | ICD-10-CM | POA: Diagnosis not present

## 2018-02-25 DIAGNOSIS — L820 Inflamed seborrheic keratosis: Secondary | ICD-10-CM | POA: Diagnosis not present

## 2018-03-09 DIAGNOSIS — D0371 Melanoma in situ of right lower limb, including hip: Secondary | ICD-10-CM | POA: Diagnosis not present

## 2018-03-09 DIAGNOSIS — L988 Other specified disorders of the skin and subcutaneous tissue: Secondary | ICD-10-CM | POA: Diagnosis not present

## 2018-03-18 DIAGNOSIS — M25562 Pain in left knee: Secondary | ICD-10-CM | POA: Diagnosis not present

## 2018-03-18 DIAGNOSIS — M1711 Unilateral primary osteoarthritis, right knee: Secondary | ICD-10-CM | POA: Diagnosis not present

## 2018-03-18 DIAGNOSIS — M25561 Pain in right knee: Secondary | ICD-10-CM | POA: Diagnosis not present

## 2018-04-07 DIAGNOSIS — Z981 Arthrodesis status: Secondary | ICD-10-CM | POA: Diagnosis not present

## 2018-04-07 DIAGNOSIS — M5136 Other intervertebral disc degeneration, lumbar region: Secondary | ICD-10-CM | POA: Diagnosis not present

## 2018-04-07 DIAGNOSIS — M47816 Spondylosis without myelopathy or radiculopathy, lumbar region: Secondary | ICD-10-CM | POA: Diagnosis not present

## 2018-04-07 DIAGNOSIS — M545 Low back pain: Secondary | ICD-10-CM | POA: Diagnosis not present

## 2018-06-28 ENCOUNTER — Other Ambulatory Visit: Payer: Self-pay | Admitting: Internal Medicine

## 2018-07-01 DIAGNOSIS — M47816 Spondylosis without myelopathy or radiculopathy, lumbar region: Secondary | ICD-10-CM | POA: Diagnosis not present

## 2018-07-01 DIAGNOSIS — Z981 Arthrodesis status: Secondary | ICD-10-CM | POA: Diagnosis not present

## 2018-07-01 NOTE — Telephone Encounter (Signed)
Letter mailed overdue CPE reminder to schedule appt

## 2018-07-08 DIAGNOSIS — Z1283 Encounter for screening for malignant neoplasm of skin: Secondary | ICD-10-CM | POA: Diagnosis not present

## 2018-07-08 DIAGNOSIS — D225 Melanocytic nevi of trunk: Secondary | ICD-10-CM | POA: Diagnosis not present

## 2018-07-08 DIAGNOSIS — Z08 Encounter for follow-up examination after completed treatment for malignant neoplasm: Secondary | ICD-10-CM | POA: Diagnosis not present

## 2018-07-08 DIAGNOSIS — Z85820 Personal history of malignant melanoma of skin: Secondary | ICD-10-CM | POA: Diagnosis not present

## 2018-07-08 DIAGNOSIS — L720 Epidermal cyst: Secondary | ICD-10-CM | POA: Diagnosis not present

## 2018-07-21 ENCOUNTER — Telehealth: Payer: Self-pay | Admitting: Internal Medicine

## 2018-07-21 NOTE — Telephone Encounter (Signed)
These medications were refilled 07/01/2018

## 2018-07-21 NOTE — Telephone Encounter (Signed)
Pt is requesting a refill on the amlodipine and lisinopril. Pt uses [facility]. Pt was scheduled for her physical on 09/10/18.

## 2018-07-30 ENCOUNTER — Telehealth: Payer: Self-pay | Admitting: Internal Medicine

## 2018-07-30 MED ORDER — LISINOPRIL-HYDROCHLOROTHIAZIDE 20-12.5 MG PO TABS: 2.0000 | ORAL_TABLET | Freq: Every day | ORAL | 0 refills | Status: DC

## 2018-07-30 MED ORDER — AMLODIPINE BESYLATE 5 MG PO TABS: 5.0000 mg | ORAL_TABLET | Freq: Every day | ORAL | 0 refills | Status: DC

## 2018-07-30 NOTE — Telephone Encounter (Signed)
Rx sent through [facility]  

## 2018-07-30 NOTE — Addendum Note (Signed)
Addended by: Lurlean Nanny on: 07/30/2018 04:57 PM   Modules accepted: Orders

## 2018-07-30 NOTE — Telephone Encounter (Signed)
Best number [phone] Pt called needing to get her rx sent to [facility] fax [phone] Phone# [phone]  Insurance change and all rx need to be sent to [facility]  Amlodipine tab 5mg  Lisinopril htcz 20-12.5  Pt has about 1  1/2 week left Please let pt know when this has been done   To do mail order this needs to be 90day

## 2018-07-30 NOTE — Telephone Encounter (Signed)
Ok to send to mail order?  

## 2018-08-25 DIAGNOSIS — Z1589 Genetic susceptibility to other disease: Secondary | ICD-10-CM | POA: Diagnosis not present

## 2018-08-25 DIAGNOSIS — M47816 Spondylosis without myelopathy or radiculopathy, lumbar region: Secondary | ICD-10-CM | POA: Diagnosis not present

## 2018-09-10 ENCOUNTER — Encounter: Payer: Medicare Other | Admitting: Internal Medicine

## 2018-10-13 ENCOUNTER — Encounter: Payer: Medicare Other | Admitting: Internal Medicine

## 2018-12-08 ENCOUNTER — Encounter: Payer: Self-pay | Admitting: Family Medicine

## 2018-12-08 ENCOUNTER — Encounter (HOSPITAL_COMMUNITY): Payer: Self-pay | Admitting: Emergency Medicine

## 2018-12-08 ENCOUNTER — Emergency Department (HOSPITAL_COMMUNITY): Payer: PPO

## 2018-12-08 ENCOUNTER — Ambulatory Visit (INDEPENDENT_AMBULATORY_CARE_PROVIDER_SITE_OTHER): Payer: PPO | Admitting: Family Medicine

## 2018-12-08 ENCOUNTER — Emergency Department (HOSPITAL_COMMUNITY)
Admission: EM | Admit: 2018-12-08 | Discharge: 2018-12-08 | Disposition: A | Discharge status: Home / Self Care | Payer: PPO | Attending: Emergency Medicine | Admitting: Emergency Medicine

## 2018-12-08 ENCOUNTER — Other Ambulatory Visit: Payer: Self-pay

## 2018-12-08 VITALS — BP 138/74 | HR 86 | Temp 98.6°F | Resp 24

## 2018-12-08 DIAGNOSIS — R030 Elevated blood-pressure reading, without diagnosis of hypertension: Secondary | ICD-10-CM

## 2018-12-08 DIAGNOSIS — S82844A Nondisplaced bimalleolar fracture of right lower leg, initial encounter for closed fracture: Secondary | ICD-10-CM | POA: Insufficient documentation

## 2018-12-08 DIAGNOSIS — S0003XA Contusion of scalp, initial encounter: Secondary | ICD-10-CM | POA: Insufficient documentation

## 2018-12-08 DIAGNOSIS — W1842XA Slipping, tripping and stumbling without falling due to stepping into hole or opening, initial encounter: Secondary | ICD-10-CM | POA: Diagnosis not present

## 2018-12-08 DIAGNOSIS — S8991XA Unspecified injury of right lower leg, initial encounter: Secondary | ICD-10-CM | POA: Diagnosis not present

## 2018-12-08 DIAGNOSIS — Y92018 Other place in single-family (private) house as the place of occurrence of the external cause: Secondary | ICD-10-CM | POA: Diagnosis not present

## 2018-12-08 DIAGNOSIS — S99911A Unspecified injury of right ankle, initial encounter: Secondary | ICD-10-CM | POA: Diagnosis present

## 2018-12-08 DIAGNOSIS — S8001XA Contusion of right knee, initial encounter: Secondary | ICD-10-CM

## 2018-12-08 DIAGNOSIS — I10 Essential (primary) hypertension: Secondary | ICD-10-CM | POA: Diagnosis not present

## 2018-12-08 DIAGNOSIS — Y9389 Activity, other specified: Secondary | ICD-10-CM | POA: Diagnosis not present

## 2018-12-08 DIAGNOSIS — S82841A Displaced bimalleolar fracture of right lower leg, initial encounter for closed fracture: Secondary | ICD-10-CM | POA: Diagnosis not present

## 2018-12-08 DIAGNOSIS — Y998 Other external cause status: Secondary | ICD-10-CM | POA: Insufficient documentation

## 2018-12-08 DIAGNOSIS — S0990XA Unspecified injury of head, initial encounter: Secondary | ICD-10-CM | POA: Diagnosis not present

## 2018-12-08 DIAGNOSIS — S8261XA Displaced fracture of lateral malleolus of right fibula, initial encounter for closed fracture: Secondary | ICD-10-CM | POA: Diagnosis not present

## 2018-12-08 DIAGNOSIS — S99921A Unspecified injury of right foot, initial encounter: Secondary | ICD-10-CM | POA: Diagnosis not present

## 2018-12-08 MED ORDER — OXYCODONE-ACETAMINOPHEN 5-325 MG PO TABS
1.0000 | ORAL_TABLET | Freq: Once | ORAL | Status: AC
  Administered 2018-12-08: 1 via ORAL
  Filled 2018-12-08: qty 1

## 2018-12-08 MED ORDER — OXYCODONE-ACETAMINOPHEN 5-325 MG PO TABS: 1.0000 | ORAL_TABLET | ORAL | 0 refills | Status: AC | PRN

## 2018-12-08 NOTE — ED Notes (Signed)
This EMT was walking up hallway when I was stopped by phlebotomy stating there was a emergency in the restroom. Once at restroom I found this pt sitting upright on the floor with the xray tech holding her up with her leg. Xray tech advised pt had fallen while trying to leave the restroom. This EMT was told by pt she hit her head and knee when she fell. Pt denied neck pain while sitting on floor. RN was notified and PA. Pt was assisted to stand and sit back in chair and rolled to room 6. Vitals were taken and ice placed on pts knee. PA came and assessed pt.

## 2018-12-08 NOTE — ED Notes (Signed)
Patient given discharge paperwork, discussed her prescription and where to pick it up as well as follow up care.  No further questions

## 2018-12-08 NOTE — Assessment & Plan Note (Signed)
Suspected fracture, but given level of pain and discomfort feel that the ER would be best place for continued diagnosis and management. Pt did not feel she could tolerate movement of the foot for XR in the office and agreed to go to the ER for further management.

## 2018-12-08 NOTE — Progress Notes (Signed)
Orthopedic Tech Progress Note Patient Details:  Belinda Calderon 04-28-1961 [id number]  Ortho Devices Type of Ortho Device: Ace wrap, Knee Sleeve, Stirrup splint, Post (short leg) splint Ortho Device/Splint Location: Right Ortho Device/Splint Interventions: Application   Post Interventions Patient Tolerated: Well Instructions Provided: Care of device   [name] 12/08/2018, 2:30 PM

## 2018-12-08 NOTE — ED Notes (Signed)
Pt. Was sent here because her pain was not controllable to have a ankle xray.

## 2018-12-08 NOTE — ED Provider Notes (Signed)
[facility] EMERGENCY DEPARTMENT Provider Note   CSN: [id number] Arrival date & time: 12/08/18  1204     History   Chief Complaint Chief Complaint  Patient presents with   Ankle Pain    HPI Belinda Calderon is a 57 y.o. female.     HPI  Patient is a 58 year old female with past medical history of ankylosing spondylitis, fibromyalgia, hypertension, IBS and degenerative disc disease presenting for right ankle and foot injury.  Patient reports that just prior to arrival, she put her foot into a hole as she was coming out of her mother's house, and she twisted her ankle in an inversion fashion.  She reports that she had immediate swelling to the lateral malleolus.  She reports that she was unable to bear weight on it ever since.  Patient denies any weakness or numbness in her toes.  She denies any discoloration of the ankle.  Past Medical History:  Diagnosis Date   Ankylosing spondylitis ([place])    Degenerative disc disease    Fibromyalgia    Hypertension    IBS (irritable bowel syndrome)     Patient Active Problem List   Diagnosis Date Noted   Injury of right foot 12/08/2018   Depression 07/05/2017   Lumbar spondylosis 05/15/2017   HTN (hypertension) 03/13/2016   Fibromyalgia    Ankylosing spondylitis ([facility])    IBS (irritable bowel syndrome)    OA (osteoarthritis) of knee     Past Surgical History:  Procedure Laterality Date   ABDOMINAL HYSTERECTOMY     partial   CESAREAN SECTION     KNEE SURGERY     SPINE SURGERY  04/24/2017   TONSILLECTOMY       OB History   No obstetric history on file.      Home Medications    Prior to Admission medications   Medication Sig Start Date End Date Taking? Authorizing Provider  amLODipine (NORVASC) 5 MG tablet Take 1 tablet (5 mg total) by mouth daily. Patient not taking: Reported on 12/08/2018 07/30/18   Jearld Fenton, NP  lisinopril-hydrochlorothiazide (PRINZIDE,ZESTORETIC) 20-12.5 MG  tablet Take 2 tablets by mouth daily. Patient not taking: Reported on 12/08/2018 07/30/18   Jearld Fenton, NP  NON FORMULARY     [provider]    Family History Family History  Problem Relation Age of Onset   Arthritis Mother    Arthritis Father    Hyperlipidemia Father    Stroke Father    Hypertension Father    Diabetes Father    Arthritis Maternal Grandmother    Arthritis Maternal Grandfather    Arthritis Paternal Grandmother    Heart disease Paternal Grandmother    Stroke Paternal Grandmother    Hypertension Paternal Grandmother    Diabetes Paternal Grandmother    Arthritis Paternal Grandfather    Hypertension Paternal Grandfather    Diabetes Paternal Grandfather     Social History Social History   Tobacco Use   Smoking status: Never Smoker   Smokeless tobacco: Never Used  Substance Use Topics   Alcohol use: Yes    Comment: rare   Drug use: Yes    Types: Marijuana    Comment: Cannabis oil     Allergies   Sulfa antibiotics and Ibuprofen   Review of Systems Review of Systems  Musculoskeletal: Positive for arthralgias, joint swelling and myalgias.  Skin: Negative for color change and wound.  Neurological: Negative for weakness and numbness.     Physical Exam  Updated Vital Signs BP (!) 164/97    Pulse 80    Temp 98.6 F (37 C) (Oral)    Resp 20    Ht 5\' 7"  (1.702 m)    Wt 90.7 kg    LMP  (Exact Date)    SpO2 97%    BMI 31.32 kg/m   Physical Exam Vitals signs and nursing note reviewed.  Constitutional:      General: She is not in acute distress.    Appearance: She is well-developed. She is not diaphoretic.     Comments: Sitting comfortably in bed.  HENT:     Head: Normocephalic and atraumatic.  Eyes:     General:        Right eye: No discharge.        Left eye: No discharge.     Conjunctiva/sclera: Conjunctivae normal.     Comments: EOMs normal to gross examination.  Neck:     Musculoskeletal: Normal range of  motion.  Cardiovascular:     Rate and Rhythm: Normal rate and regular rhythm.     Comments: Intact, 2+ DP pulse of RLE. Pulmonary:     Comments: Patient converses comfortably without audible wheeze or stridor. Abdominal:     General: There is no distension.  Musculoskeletal:        General: Swelling and tenderness present.     Comments: Right ankle with tenderness to palpation of lateral malleolus.  Soft tissue swelling over lateral malleolus.   ROM decreased with due to pain. No erythema, ecchymosis, or deformity appreciated. No break in skin. No pain to fifth metatarsal area or navicular region.  No proximal fibular tenderness. Achilles intact per Thompsons test. Good pedal pulse and cap refill of toes. Sensation intact to light touch distally.   Skin:    General: Skin is warm and dry.  Neurological:     Mental Status: She is alert.     Comments: Cranial nerves intact to gross observation. Patient moves extremities without difficulty.  Psychiatric:        Behavior: Behavior normal.        Thought Content: Thought content normal.        Judgment: Judgment normal.      ED Treatments / Results  Labs (all labs ordered are listed, but only abnormal results are displayed) Labs Reviewed - No data to display  EKG    Radiology Dg Ankle Complete Right  Result Date: 12/08/2018 CLINICAL DATA:  Foot and ankle injury. EXAM: RIGHT ANKLE - COMPLETE 3+ VIEW COMPARISON:  12/08/2018. FINDINGS: Displaced fracture of the lateral malleolus. Nondisplaced fracture of the medial malleolus cannot be excluded. Diffuse degenerative change. IMPRESSION: Displaced fracture of the lateral malleolus. Nondisplaced fracture of the medial malleolus cannot be excluded. Electronically Signed   By: Marcello Moores  Register   On: 12/08/2018 13:21   Ct Head Wo Contrast  Result Date: 12/08/2018 CLINICAL DATA:  Head trauma status post fall EXAM: CT HEAD WITHOUT CONTRAST TECHNIQUE: Contiguous axial images were obtained from  the base of the skull through the vertex without intravenous contrast. COMPARISON:  None. FINDINGS: Brain: No evidence of acute infarction, hemorrhage, hydrocephalus, extra-axial collection or mass lesion/mass effect. Vascular: No hyperdense vessel or unexpected calcification. Skull: No osseous abnormality. Sinuses/Orbits: Visualized paranasal sinuses are clear. Visualized mastoid sinuses are clear. Visualized orbits demonstrate no focal abnormality. Other: None IMPRESSION: No acute intracranial pathology. Electronically Signed   By: Kathreen Devoid   On: 12/08/2018 14:06   Dg Knee Complete 4 Views  Right  Result Date: 12/08/2018 CLINICAL DATA:  Fall on right knee. EXAM: RIGHT KNEE - COMPLETE 4+ VIEW COMPARISON:  No recent prior. FINDINGS: Diffuse osteopenia. Severe tricompartment degenerative change. Degenerative changes most prominent about the patellofemoral and lateral compartments. No acute bony abnormality. Soft tissues are unremarkable. IMPRESSION: Diffuse osteopenia. Severe tricompartment degenerative change. Degenerative changes most prominent about patellofemoral and lateral compartments. No acute bony abnormality. Electronically Signed   By: Marcello Moores  Register   On: 12/08/2018 13:56   Dg Foot Complete Right  Result Date: 12/08/2018 CLINICAL DATA:  Foot and ankle injury EXAM: RIGHT FOOT COMPLETE - 3+ VIEW COMPARISON:  None FINDINGS: Mild osseous demineralization. Joint spaces preserved. No acute fracture, dislocation, or bone destruction. Mildly displaced avulsion fracture at tip of lateral malleolus. Cortical margination at the lateral margin of the distal calcaneus is also identified, cannot exclude additional avulsion fracture. Accessory ossicles adjacent to the lateral margin of cuboid and at medial margin of tarsal navicular. No additional fracture, dislocation or bone destruction. IMPRESSION: Avulsion fracture at tip of lateral malleolus. Question capsular avulsion fracture at distal aspect of  calcaneus laterally at calcaneocuboid joint. Electronically Signed   By: Lavonia Dana M.D.   On: 12/08/2018 13:26    Procedures Procedures (including critical care time)  Medications Ordered in ED Medications  oxyCODONE-acetaminophen (PERCOCET/ROXICET) 5-325 MG per tablet 1 tablet (has no administration in time range)  oxyCODONE-acetaminophen (PERCOCET/ROXICET) 5-325 MG per tablet 1 tablet (1 tablet Oral Given 12/08/18 1226)     Initial Impression / Assessment and Plan / ED Course  I have reviewed the triage vital signs and the nursing notes.  Pertinent labs & imaging results that were available during my care of the patient were reviewed by me and considered in my medical decision making (see chart for details).  Clinical Course as of Dec 07 1424  Tue Dec 08, 2018  1217 Patient verbally verified a safe ride from the ED. Proceeded with prescribing Percocet for pain/relaxtion/muscle relaxation in the ED.   [AM]  1314 I was called to evaluated the patient after a fall in the bathroom during which she sustained head trauma and hit her right patella. Will obtain imaging. GCS 15.   [AM]  2637 Spoke with Dr. Griffin Basil of orthopedic surgery who states that patient can be placed in a splint follow up in a week. No need for reduction in ED. Appreciate his involvement.    [AM]    Clinical Course User Index [AM] Albesa Seen, PA-C       Patient is nontoxic-appearing, afebrile, and in no acute distress.  She is neurovascularly intact in the right lower extremity.  Patient has overlying soft tissue swelling of the lateral malleolus concerning for fracture.  Will obtain radiographs.  Radiograph demonstrating displaced fracture of the distal fibula as well as possible bimalleolar fracture.  Case discussed over the phone with Dr. Ophelia Charter who recommends splinting and posterior leg splint and follow-up.  Further discussed with the patient that she has a previous relationship with [facility]  orthopedics and will follow up with them.  As a secondary issue, while patient was going to the bathroom after obtaining her imaging, she tripped and fell in the bathroom.  She denies any presyncope before this.  She sustained a hematoma on the left forehead and fell down onto her right knee.  Further imaging of head and right knee obtained without acute abnormality.  Posterior leg and stirrup splint placed and patient had good capillary refill post  procedure.  She will follow-up with her orthopedist office.  She is given return precautions for any increasing pain, pallor or paresthesias of the right lower extremity.  Patient is in understanding and agrees with plan of care.  I have reviewed the patient's information in the [facility] for the past 12 months and found them to have no Rx overlapping.  Opiates were prescribed for an acute, painful condition. The patient was given information on side effects and encouraged to use other, non-opiate pain medication primary, only using opiate medicine sparingly for severe pain.  Final Clinical Impressions(s) / ED Diagnoses   Final diagnoses:  Closed bimalleolar fracture of right ankle, initial encounter  Elevated blood pressure reading  Contusion of scalp, initial encounter  Contusion of right knee, initial encounter    ED Discharge Orders         Ordered    oxyCODONE-acetaminophen (PERCOCET/ROXICET) 5-325 MG tablet  Every 4 hours PRN     12/08/18 1531           Albesa Seen, PA-C 12/08/18 1644    Daleen Bo, MD 12/11/18 1153

## 2018-12-08 NOTE — ED Triage Notes (Signed)
Pt reports stepping off the porch and twisting her ankle.

## 2018-12-08 NOTE — Discharge Instructions (Addendum)
Please see the information and instructions below regarding your visit.  Your diagnoses today include:  1. Closed bimalleolar fracture of right ankle, initial encounter   2. Elevated blood pressure reading   3. Contusion of scalp, initial encounter   4. Contusion of right knee, initial encounter     Tests performed today include: See side panel of your discharge paperwork for testing performed today. Vital signs are listed at the bottom of these instructions.   Medications prescribed:    Take any prescribed medications only as prescribed, and any over the counter medications only as directed on the packaging.  You have been prescribed percocet for pain. This is an opioid pain medication. You may take this medication every 4-6 hours as needed for pain. Only take this medication if you need it for breakthrough pain.   Do not combine this medication with Tylenol, as it may increase the risk of liver problems.  Do not combine this medication with alcohol.  Please be advised to avoid driving or operating heavy machinery while taking this medication, as it may make you drowsy or impair judgment.    Home care instructions:  Please follow any educational materials contained in this packet.   Follow-up instructions: Please follow-up with your orthopedist as soon as possible.   Return instructions:  Please return to the Emergency Department if you experience worsening symptoms.  Please come back to the emergency department if you develop any worsening pain, swelling in your toes where there is numbness, discoloration of the toes or they are white or blue, or new or worsening symptoms. Please return if you have any other emergent concerns.  Additional Information:   Your vital signs today were: BP (!) 164/97    Pulse 80    Temp 98.6 F (37 C) (Oral)    Resp 20    Ht 5\' 7"  (1.702 m)    Wt 90.7 kg    LMP  (Exact Date)    SpO2 97%    BMI 31.32 kg/m  If your blood pressure (BP) was  elevated on multiple readings during this visit above 130 for the top number or above 80 for the bottom number, please have this repeated by your primary care provider within one month. --------------  Thank you for allowing [place] to participate in your care today.

## 2018-12-08 NOTE — ED Notes (Signed)
Pt was with radiology tech Cathlean Marseilles and fell in the bathroom. It was reported to this RN that the pt fell in the bathroom and struck her knee and head. Swelling noted to the right knee and a red mark noted to the forehead. No LOC noted.

## 2018-12-08 NOTE — Progress Notes (Signed)
   Subjective:     Belinda Calderon is a 58 y.o. female presenting for Foot Injury (right foot. Stepped off the porch today into a hole she did not see and stepped wrong. Pain is a 8 to 9 on a scale of 1 to 10.)     HPI   #Foot injury - right - step off the curve into a hole - felt and heard a crack in her foot - had numbness  - had to use her hands to bring the foot back up - severe pain and swelling now - pain is 8-9/10 - has not taking anything for the pain - does not feel that ibuprofen would help - using essential oils on the foot - endorses chronic right LE numbness, however numbness in the foot is worse than normal   Review of Systems  Constitutional: Negative for chills and fever.     Social History   Tobacco Use  Smoking Status Never Smoker  Smokeless Tobacco Never Used        Objective:    BP Readings from Last 3 Encounters:  12/08/18 138/74  10/14/17 120/80  08/08/17 120/70   Wt Readings from Last 3 Encounters:  10/14/17 210 lb (95.3 kg)  08/08/17 210 lb 6.4 oz (95.4 kg)  07/05/17 210 lb (95.3 kg)    BP 138/74   Pulse 86   Temp 98.6 F (37 C)   Resp (!) 24   LMP  (LMP Unknown)   SpO2 99%    Physical Exam Constitutional:      Appearance: She is obese.     Comments: Sitting in wheel chair, crying  HENT:     Head: Normocephalic and atraumatic.     Right Ear: External ear normal.     Left Ear: External ear normal.     Nose: Nose normal.  Cardiovascular:     Pulses:          Dorsalis pedis pulses are 2+ on the right side.       Posterior tibial pulses are 2+ on the right side.  Pulmonary:     Effort: Pulmonary effort is normal. No respiratory distress.  Musculoskeletal:     Comments: Right foot:  Inspection: brown oil rubbed on the lateral edge of the foot, swelling most prominent over the lateral malleolus  Palpation: severe pain with light touch diffusely over the foot, though worse on the lateral heal and lateral malleolus ROM:  deferred 2/2 to pain Ligaments: deferred 2/2 to pain Sensation: intact to light touch   Neurological:     Mental Status: She is alert.           Assessment & Plan:   Problem List Items Addressed This Visit      Other   Injury of right foot - Primary    Suspected fracture, but given level of pain and discomfort feel that the ER would be best place for continued diagnosis and management. Pt did not feel she could tolerate movement of the foot for XR in the office and agreed to go to the ER for further management.         Sign-out to [facility] ER.   Return if symptoms worsen or fail to improve.  Lesleigh Noe, MD

## 2018-12-09 ENCOUNTER — Telehealth: Payer: Self-pay | Admitting: Internal Medicine

## 2018-12-09 NOTE — Telephone Encounter (Signed)
Patient called and stated that she is unable to make it to her appointment for her CPE due to an injured foot and having issues getting around. She stated that she was advised last time in order for a refill to be sent in for her BP medication she would need to make it to this appointment.   She would like to know what she should do since she can not make it to this appointment tomorrow 6/25 and she is needing a refill on her RX    Patient's C/B # [phone]

## 2018-12-09 NOTE — Telephone Encounter (Signed)
Will discuss at visit

## 2018-12-09 NOTE — Telephone Encounter (Signed)
Can we not do a virtual appt? If not, okay to refill meds x 30 days and have her schedule by end of July

## 2018-12-09 NOTE — Telephone Encounter (Signed)
Patient called back to check status of message.  Advised her that we could do this virtually or reschedule for July,. She switched to virtual appt. But is not sure how she would be able to do labs.

## 2018-12-10 ENCOUNTER — Ambulatory Visit (INDEPENDENT_AMBULATORY_CARE_PROVIDER_SITE_OTHER): Payer: PPO | Admitting: Internal Medicine

## 2018-12-10 ENCOUNTER — Encounter: Payer: Self-pay | Admitting: Internal Medicine

## 2018-12-10 DIAGNOSIS — M456 Ankylosing spondylitis lumbar region: Secondary | ICD-10-CM

## 2018-12-10 DIAGNOSIS — F329 Major depressive disorder, single episode, unspecified: Secondary | ICD-10-CM

## 2018-12-10 DIAGNOSIS — M1711 Unilateral primary osteoarthritis, right knee: Secondary | ICD-10-CM | POA: Diagnosis not present

## 2018-12-10 DIAGNOSIS — S82841D Displaced bimalleolar fracture of right lower leg, subsequent encounter for closed fracture with routine healing: Secondary | ICD-10-CM

## 2018-12-10 DIAGNOSIS — Z0000 Encounter for general adult medical examination without abnormal findings: Secondary | ICD-10-CM

## 2018-12-10 DIAGNOSIS — I10 Essential (primary) hypertension: Secondary | ICD-10-CM

## 2018-12-10 DIAGNOSIS — F419 Anxiety disorder, unspecified: Secondary | ICD-10-CM | POA: Diagnosis not present

## 2018-12-10 DIAGNOSIS — M797 Fibromyalgia: Secondary | ICD-10-CM

## 2018-12-10 DIAGNOSIS — F32A Depression, unspecified: Secondary | ICD-10-CM

## 2018-12-10 DIAGNOSIS — K580 Irritable bowel syndrome with diarrhea: Secondary | ICD-10-CM | POA: Diagnosis not present

## 2018-12-10 NOTE — Assessment & Plan Note (Signed)
Continue CBD Encouraged regular stretching and core strengthening

## 2018-12-10 NOTE — Assessment & Plan Note (Signed)
Encouraged regular physical activity Continue CBD oil

## 2018-12-10 NOTE — Patient Instructions (Signed)

## 2018-12-10 NOTE — Assessment & Plan Note (Signed)
Managed with CBD oil Support offered today

## 2018-12-10 NOTE — Progress Notes (Signed)
Virtual Visit via Video Note  I connected with Belinda Calderon on 12/10/18 at 10:45 AM EDT by a video enabled telemedicine application and verified that I am speaking with the correct person using two identifiers.  Location: Patient: Home Provider: Office   I discussed the limitations of evaluation and management by telemedicine and the availability of in person appointments. The patient expressed understanding and agreed to proceed.  History of Present Illness:   Pt due for her subsequent Annual Medicare Wellness Exam. She is also due for follow up of chronic conditions.  Ankylosing Spondylitis: s/p surgery. She uses CBD oil. She no longer works with PT. She follows with Dr. Carloyn Manner.  IBS: Mainly diarrhea. She is taking a Probiotic OTC for this.  Fibromyalgia: Chronic generalized muscle pain. She manages this with CBD oil.  OA: Mainly in her knees. She gets steroid injection intermittently. She follows with Dr. Alvan Dame.  HTN: She does not monitor her BP at home. She is taking Amlodipine and Lisinopril HCT as prescribed. ECG from 01/2017 reviewed.  Right Ankle Fracture: in a splint. She follows up with Dr. Alvan Dame tomorrow. She is taking Percocet as needed.  HPI:  Past Medical History:  Diagnosis Date  . Ankylosing spondylitis ([place])   . Degenerative disc disease   . Fibromyalgia   . Hypertension   . IBS (irritable bowel syndrome)     Current Outpatient Medications  Medication Sig Dispense Refill  . amLODipine (NORVASC) 5 MG tablet Take 1 tablet (5 mg total) by mouth daily. 90 tablet 0  . lisinopril-hydrochlorothiazide (PRINZIDE,ZESTORETIC) 20-12.5 MG tablet Take 2 tablets by mouth daily. 180 tablet 0  . NON FORMULARY     . oxyCODONE-acetaminophen (PERCOCET/ROXICET) 5-325 MG tablet Take 1 tablet by mouth every 4 (four) hours as needed for up to 3 days for severe pain. 18 tablet 0   No current facility-administered medications for this visit.     Allergies  Allergen Reactions  .  Sulfa Antibiotics Hives and Swelling  . Ibuprofen     GI IRRITATION    Family History  Problem Relation Age of Onset  . Arthritis Mother   . Arthritis Father   . Hyperlipidemia Father   . Stroke Father   . Hypertension Father   . Diabetes Father   . Arthritis Maternal Grandmother   . Arthritis Maternal Grandfather   . Arthritis Paternal Grandmother   . Heart disease Paternal Grandmother   . Stroke Paternal Grandmother   . Hypertension Paternal Grandmother   . Diabetes Paternal Grandmother   . Arthritis Paternal Grandfather   . Hypertension Paternal Grandfather   . Diabetes Paternal Grandfather     Social History   Socioeconomic History  . Marital status: Married    Spouse name: Not on file  . Number of children: Not on file  . Years of education: Not on file  . Highest education level: Not on file  Occupational History  . Not on file  Social Needs  . Financial resource strain: Not on file  . Food insecurity    Worry: Not on file    Inability: Not on file  . Transportation needs    Medical: Not on file    Non-medical: Not on file  Tobacco Use  . Smoking status: Never Smoker  . Smokeless tobacco: Never Used  Substance and Sexual Activity  . Alcohol use: Yes    Comment: rare  . Drug use: Yes    Types: Marijuana    Comment: Cannabis oil  .  Sexual activity: Yes  Lifestyle  . Physical activity    Days per week: Not on file    Minutes per session: Not on file  . Stress: Not on file  Relationships  . Social Herbalist on phone: Not on file    Gets together: Not on file    Attends religious service: Not on file    Active member of club or organization: Not on file    Attends meetings of clubs or organizations: Not on file    Relationship status: Not on file  . Intimate partner violence    Fear of current or ex partner: Not on file    Emotionally abused: Not on file    Physically abused: Not on file    Forced sexual activity: Not on file  Other  Topics Concern  . Not on file  Social History Narrative  . Not on file    Hospitiliaztions: None  Health Maintenance:    Flu: never  Tetanus: 08/2011  Mammogram: > 5 years ago  Pap Smear: > 5 years ago  Colon Screening: Cologuard 04/2016  Eye Doctor: annually  Dental Exam: as needed   Providers:   PCP: Webb Silversmith, NP  Neurosurgeon: Dr. Carloyn Manner  Orthopedist: Dr. Alvan Dame   I have personally reviewed and have noted:  1. The patient's medical and social history 2. Their use of alcohol, tobacco or illicit drugs 3. Their current medications and supplements 4. The patient's functional ability including ADL's, fall risks, home safety risks and hearing or visual impairment. 5. Diet and physical activities 6. Evidence for depression or mood disorder  Subjective:   Review of Systems:   Constitutional: Denies fever, malaise, fatigue, headache or abrupt weight changes.  HEENT: Denies eye pain, eye redness, ear pain, ringing in the ears, wax buildup, runny nose, nasal congestion, bloody nose, or sore throat. Respiratory: Denies difficulty breathing, shortness of breath, cough or sputum production.   Cardiovascular: Denies chest pain, chest tightness, palpitations or swelling in the hands or feet.  Gastrointestinal: Pt reports intermittent diarrhea. Denies abdominal pain, bloating, constipation, or blood in the stool.  GU: Denies urgency, frequency, pain with urination, burning sensation, blood in urine, odor or discharge. Musculoskeletal: Pt reports chronic back pain, right ankle pain. Denies decrease in range of motion, difficulty with gait, or joint swelling.  Skin: Denies redness, rashes, lesions or ulcercations.  Neurological: Denies dizziness, difficulty with memory, difficulty with speech or problems with balance and coordination.  Psych: Pt has a history of anxiety. Denies depression, SI/HI.  No other specific complaints in a complete review of systems (except as listed in HPI  above).  Objective:  PE:   Wt Readings from Last 3 Encounters:  12/08/18 200 lb (90.7 kg)  10/14/17 210 lb (95.3 kg)  08/08/17 210 lb 6.4 oz (95.4 kg)    General: Appears her stated age, obese, in NAD. Pulmonary/Chest: Normal effor. No respiratory distress.  Musculoskeletal: Non weight bearing to RLE. Neurological: Alert and oriented. Psychiatric: Mildly anxious appearing today.  BMET    Component Value Date/Time   NA 140 10/14/2017 0938   K 3.4 (L) 10/14/2017 0938   CL 102 10/14/2017 0938   CO2 30 10/14/2017 0938   GLUCOSE 105 (H) 10/14/2017 0938   BUN 13 10/14/2017 0938   CREATININE 0.59 10/14/2017 0938   CALCIUM 9.3 10/14/2017 0938   GFRNONAA >90 09/09/2011 2110   GFRAA >90 09/09/2011 2110    Lipid Panel     Component  Value Date/Time   CHOL 172 04/02/2016 1403   TRIG 153.0 (H) 04/02/2016 1403   HDL 38.70 (L) 04/02/2016 1403   CHOLHDL 4 04/02/2016 1403   VLDL 30.6 04/02/2016 1403   LDLCALC 102 (H) 04/02/2016 1403    CBC    Component Value Date/Time   WBC 7.5 10/14/2017 0938   RBC 4.29 10/14/2017 0938   HGB 13.2 10/14/2017 0938   HCT 38.9 10/14/2017 0938   PLT 290.0 10/14/2017 0938   MCV 90.6 10/14/2017 0938   MCH 31.1 09/09/2011 2110   MCHC 33.9 10/14/2017 0938   RDW 13.3 10/14/2017 0938   LYMPHSABS 2.7 09/09/2011 2110   MONOABS 0.9 09/09/2011 2110   EOSABS 1.0 (H) 09/09/2011 2110   BASOSABS 0.0 09/09/2011 2110    Hgb A1C Lab Results  Component Value Date   HGBA1C 5.5 04/02/2016      Assessment and Plan:   Medicare Annual Wellness Visit:  Diet:  She does eat lean meat. She consumes fruits and veggies daily. She does not eat fried foods. She drinks mostly green tea. water Physical activity: Sedentary Depression/mood screen: Negative Hearing: Intact to whispered voice Visual acuity: Grossly normal, performs annual eye exam  ADLs: Capable Fall risk: None Home safety: Good Cognitive evaluation: Intact to orientation, naming, recall and  repetition EOL planning: No adv directives, full code/ I agree  Preventative Medicine: She declines flu shot. Tetanus UTD. She declines pap smear or mammogram. She declines colonoscopy but is agreeable to Cologuard, will order. Encouraged her to consume a balanced diet and exercise regimen. Advised her to see an eye doctor and dentist annually. Will have her get labs at Dr. Aurea Graff office tomorrow, CBC, CMET, Lipid and Vit D.   Next appointment: 1 year, Medicare Wellness Exam   Follow Up Instructions:    I discussed the assessment and treatment plan with the patient. The patient was provided an opportunity to ask questions and all were answered. The patient agreed with the plan and demonstrated an understanding of the instructions.   The patient was advised to call back or seek an in-person evaluation if the symptoms worsen or if the condition fails to improve as anticipated.    Webb Silversmith, NP

## 2018-12-10 NOTE — Assessment & Plan Note (Signed)
Continue Amlodipine and Lisinopril HCT Element of white coat HTN Reinforced DASH diet and exercise for weight loss Will obtain CBC and CMET

## 2018-12-10 NOTE — Assessment & Plan Note (Signed)
Continue probiotic Will monitor

## 2018-12-10 NOTE — Assessment & Plan Note (Signed)
Encouraged regular physical activity Continue to follow with Dr. Alvan Dame

## 2018-12-11 ENCOUNTER — Other Ambulatory Visit: Payer: Self-pay | Admitting: Internal Medicine

## 2018-12-11 ENCOUNTER — Telehealth: Payer: Self-pay | Admitting: Internal Medicine

## 2018-12-11 DIAGNOSIS — M1711 Unilateral primary osteoarthritis, right knee: Secondary | ICD-10-CM | POA: Diagnosis not present

## 2018-12-11 DIAGNOSIS — M25571 Pain in right ankle and joints of right foot: Secondary | ICD-10-CM | POA: Diagnosis not present

## 2018-12-11 DIAGNOSIS — Z0000 Encounter for general adult medical examination without abnormal findings: Secondary | ICD-10-CM

## 2018-12-11 DIAGNOSIS — M25561 Pain in right knee: Secondary | ICD-10-CM | POA: Diagnosis not present

## 2018-12-11 NOTE — Telephone Encounter (Signed)
Patient stated that she was unable to get her blood work done yesterday at her orthopedic appointment due to their lab being closed.  She also wanted to let you know that the orthopedic doctor said she has pulled the bone away from the ligament and will be in a boot for 8 weeks.     Patient's C/B # [phone]

## 2018-12-11 NOTE — Telephone Encounter (Signed)
Noted, thanks for the update. I will order labs and she can schedule a lab only appt here to get that doene

## 2018-12-14 NOTE — Telephone Encounter (Signed)
Pt is aware as instructed and will call the office to schedule a lab only appointment... pt will need to be drawn from car as she is in a cast on her lower leg/ankle

## 2018-12-15 ENCOUNTER — Other Ambulatory Visit (INDEPENDENT_AMBULATORY_CARE_PROVIDER_SITE_OTHER): Payer: PPO

## 2018-12-15 ENCOUNTER — Other Ambulatory Visit: Payer: Self-pay

## 2018-12-15 DIAGNOSIS — Z0000 Encounter for general adult medical examination without abnormal findings: Secondary | ICD-10-CM

## 2018-12-15 LAB — CBC
HCT: 41 % (ref 36.0–46.0)
Hemoglobin: 13.9 g/dL (ref 12.0–15.0)
MCHC: 33.8 g/dL (ref 30.0–36.0)
MCV: 92.1 fl (ref 78.0–100.0)
Platelets: 279 10*3/uL (ref 150.0–400.0)
RBC: 4.45 Mil/uL (ref 3.87–5.11)
RDW: 13.2 % (ref 11.5–15.5)
WBC: 8.8 10*3/uL (ref 4.0–10.5)

## 2018-12-15 LAB — COMPREHENSIVE METABOLIC PANEL
ALT: 13 U/L (ref 0–35)
AST: 13 U/L (ref 0–37)
Albumin: 4.1 g/dL (ref 3.5–5.2)
Alkaline Phosphatase: 83 U/L (ref 39–117)
BUN: 18 mg/dL (ref 6–23)
CO2: 25 mEq/L (ref 19–32)
Calcium: 9 mg/dL (ref 8.4–10.5)
Chloride: 106 mEq/L (ref 96–112)
Creatinine, Ser: 0.63 mg/dL (ref 0.40–1.20)
GFR: 97.21 mL/min (ref >60.00)
Glucose, Bld: 89 mg/dL (ref 70–99)
Potassium: 3.9 mEq/L (ref 3.5–5.1)
Sodium: 139 mEq/L (ref 135–145)
Total Bilirubin: 0.6 mg/dL (ref 0.2–1.2)
Total Protein: 6.7 g/dL (ref 6.0–8.3)

## 2018-12-15 LAB — LIPID PANEL
Cholesterol: 157 mg/dL (ref 0–200)
HDL: 32.3 mg/dL — ABNORMAL LOW (ref >39.00)
NonHDL: 124.32
Total CHOL/HDL Ratio: 5
Triglycerides: 210 mg/dL — ABNORMAL HIGH (ref 0.0–149.0)
VLDL: 42 mg/dL — ABNORMAL HIGH (ref 0.0–40.0)

## 2018-12-15 LAB — VITAMIN D 25 HYDROXY (VIT D DEFICIENCY, FRACTURES): VITD: 31.06 ng/mL (ref 30.00–100.00)

## 2018-12-15 LAB — LDL CHOLESTEROL, DIRECT: Direct LDL: 108 mg/dL

## 2019-01-06 DIAGNOSIS — S82831A Other fracture of upper and lower end of right fibula, initial encounter for closed fracture: Secondary | ICD-10-CM | POA: Diagnosis not present

## 2019-01-06 DIAGNOSIS — S92001A Unspecified fracture of right calcaneus, initial encounter for closed fracture: Secondary | ICD-10-CM | POA: Diagnosis not present

## 2019-02-04 ENCOUNTER — Encounter: Payer: Self-pay | Admitting: Internal Medicine

## 2019-02-04 ENCOUNTER — Other Ambulatory Visit: Payer: Self-pay

## 2019-02-04 ENCOUNTER — Ambulatory Visit (INDEPENDENT_AMBULATORY_CARE_PROVIDER_SITE_OTHER): Payer: PPO | Admitting: Internal Medicine

## 2019-02-04 VITALS — BP 140/86 | HR 76 | Temp 98.5°F

## 2019-02-04 DIAGNOSIS — F419 Anxiety disorder, unspecified: Secondary | ICD-10-CM | POA: Diagnosis not present

## 2019-02-04 DIAGNOSIS — F329 Major depressive disorder, single episode, unspecified: Secondary | ICD-10-CM

## 2019-02-04 DIAGNOSIS — Z0289 Encounter for other administrative examinations: Secondary | ICD-10-CM | POA: Diagnosis not present

## 2019-02-04 MED ORDER — CLONAZEPAM 1 MG PO TABS: 1.0000 mg | ORAL_TABLET | Freq: Every day | ORAL | 0 refills | Status: DC | PRN

## 2019-02-04 NOTE — Progress Notes (Signed)
Subjective:    Patient ID: Belinda Calderon, female    DOB: Jan 27, 1961, 58 y.o.   MRN: [id number]  HPI  Pt presents to the clinic today with c/o  increased stress. She has a history of anxiety and depression, managed off meds. She is not seeing a therapist. She reports she recently having 2 deaths in her family, her parents are moving in with her, she is also having to care for her 2 grand children because her DIL is cheating on her son. She has noticed an increase in heart rate, feelings of panic. She has ankylosing spondylitis and fibromyalgia, which she feels like is worse due to stress. All of this used to be controlled with CBD oil and THC which she really feels is no longer helping. She has taken Cymbalta, Wellbutrin, Sertraline, Buspar, Valium, Hydroxyzine and Clonazepam in the past. She is not sure what worked and what hasn't.   She also has a handicap placard form she would like completed.  Review of Systems      Past Medical History:  Diagnosis Date  . Ankylosing spondylitis ([place])   . Degenerative disc disease   . Fibromyalgia   . Hypertension   . IBS (irritable bowel syndrome)     Current Outpatient Medications  Medication Sig Dispense Refill  . amLODipine (NORVASC) 5 MG tablet Take 1 tablet by mouth daily 90 tablet 1  . lisinopril-hydrochlorothiazide (ZESTORETIC) 20-12.5 MG tablet Take 2 tablets by mouth daily 180 tablet 1  . NON FORMULARY      No current facility-administered medications for this visit.     Allergies  Allergen Reactions  . Sulfa Antibiotics Hives and Swelling  . Ibuprofen     GI IRRITATION    Family History  Problem Relation Age of Onset  . Arthritis Mother   . Arthritis Father   . Hyperlipidemia Father   . Stroke Father   . Hypertension Father   . Diabetes Father   . Arthritis Maternal Grandmother   . Arthritis Maternal Grandfather   . Arthritis Paternal Grandmother   . Heart disease Paternal Grandmother   . Stroke Paternal  Grandmother   . Hypertension Paternal Grandmother   . Diabetes Paternal Grandmother   . Arthritis Paternal Grandfather   . Hypertension Paternal Grandfather   . Diabetes Paternal Grandfather     Social History   Socioeconomic History  . Marital status: Married    Spouse name: Not on file  . Number of children: Not on file  . Years of education: Not on file  . Highest education level: Not on file  Occupational History  . Not on file  Social Needs  . Financial resource strain: Not on file  . Food insecurity    Worry: Not on file    Inability: Not on file  . Transportation needs    Medical: Not on file    Non-medical: Not on file  Tobacco Use  . Smoking status: Never Smoker  . Smokeless tobacco: Never Used  Substance and Sexual Activity  . Alcohol use: Yes    Comment: rare  . Drug use: Yes    Types: Marijuana    Comment: Cannabis oil  . Sexual activity: Yes  Lifestyle  . Physical activity    Days per week: Not on file    Minutes per session: Not on file  . Stress: Not on file  Relationships  . Social Herbalist on phone: Not on file    Gets  together: Not on file    Attends religious service: Not on file    Active member of club or organization: Not on file    Attends meetings of clubs or organizations: Not on file    Relationship status: Not on file  . Intimate partner violence    Fear of current or ex partner: Not on file    Emotionally abused: Not on file    Physically abused: Not on file    Forced sexual activity: Not on file  Other Topics Concern  . Not on file  Social History Narrative  . Not on file     Constitutional: Denies fever, malaise, fatigue, headache or abrupt weight changes.  Respiratory: Denies difficulty breathing, shortness of breath, cough or sputum production.   Cardiovascular: Denies chest pain, chest tightness, palpitations or swelling in the hands or feet.  Musculoskeletal: Pt reports worsening muscle and joint pain.  Denies decrease in range of motion, difficulty with gait, or joint swelling.  Psych: Pt has a history of anxiety, depression and stress. Denies  SI/HI.  No other specific complaints in a complete review of systems (except as listed in HPI above).  Objective:   Physical Exam   BP 140/86   Pulse 76   Temp 98.5 F (36.9 C) (Temporal)   SpO2 98%  Wt Readings from Last 3 Encounters:  12/08/18 200 lb (90.7 kg)  10/14/17 210 lb (95.3 kg)  08/08/17 210 lb 6.4 oz (95.4 kg)    General: Appears her stated age, obese, in NAD. Cardiovascular: Normal rate and rhythm. S1,S2 noted.  No murmur, rubs or gallops noted.  Pulmonary/Chest: Normal effort and positive vesicular breath sounds. No respiratory distress. No wheezes, rales or ronchi noted.  Musculoskeletal: Wearing boot on right foot. Gait slow but steady.  Neurological: Alert and oriented.  Psychiatric: Anxious appearing, tearful. BMET    Component Value Date/Time   NA 139 12/15/2018 0918   K 3.9 12/15/2018 0918   CL 106 12/15/2018 0918   CO2 25 12/15/2018 0918   GLUCOSE 89 12/15/2018 0918   BUN 18 12/15/2018 0918   CREATININE 0.63 12/15/2018 0918   CALCIUM 9.0 12/15/2018 0918   GFRNONAA >90 09/09/2011 2110   GFRAA >90 09/09/2011 2110    Lipid Panel     Component Value Date/Time   CHOL 157 12/15/2018 0918   TRIG 210.0 (H) 12/15/2018 0918   HDL 32.30 (L) 12/15/2018 0918   CHOLHDL 5 12/15/2018 0918   VLDL 42.0 (H) 12/15/2018 0918   LDLCALC 102 (H) 04/02/2016 1403    CBC    Component Value Date/Time   WBC 8.8 12/15/2018 0918   RBC 4.45 12/15/2018 0918   HGB 13.9 12/15/2018 0918   HCT 41.0 12/15/2018 0918   PLT 279.0 12/15/2018 0918   MCV 92.1 12/15/2018 0918   MCH 31.1 09/09/2011 2110   MCHC 33.8 12/15/2018 0918   RDW 13.2 12/15/2018 0918   LYMPHSABS 2.7 09/09/2011 2110   MONOABS 0.9 09/09/2011 2110   EOSABS 1.0 (H) 09/09/2011 2110   BASOSABS 0.0 09/09/2011 2110    Hgb A1C Lab Results  Component Value Date    HGBA1C 5.5 04/02/2016           Assessment & Plan:   Encounter for Form Completion:  Handicap placard form filled out and original given back to pt.  Update me in 1 month and let me know how you are doing Webb Silversmith, NP

## 2019-02-06 NOTE — Patient Instructions (Signed)

## 2019-02-06 NOTE — Assessment & Plan Note (Signed)
Deteriorated to stress Support offered today She is not interested in seeing a therapist at this time She has had bad reactions with SRRi/SNRI's in the past Will give RX for Clonazepam 1 mg daily prn- sedation and addiction caution given Will will continue this for 3 months and then consider weaning as stress improves

## 2019-02-17 DIAGNOSIS — S82831A Other fracture of upper and lower end of right fibula, initial encounter for closed fracture: Secondary | ICD-10-CM | POA: Diagnosis not present

## 2019-02-17 DIAGNOSIS — S92001A Unspecified fracture of right calcaneus, initial encounter for closed fracture: Secondary | ICD-10-CM | POA: Diagnosis not present

## 2019-02-17 DIAGNOSIS — M1711 Unilateral primary osteoarthritis, right knee: Secondary | ICD-10-CM | POA: Diagnosis not present

## 2019-04-08 ENCOUNTER — Telehealth: Payer: Self-pay | Admitting: Internal Medicine

## 2019-04-08 DIAGNOSIS — Z1211 Encounter for screening for malignant neoplasm of colon: Secondary | ICD-10-CM

## 2019-04-08 NOTE — Telephone Encounter (Signed)
Called pt, she was not available.   I do not see where pt has come into the office or a phone note but Cologuard is every 3 months and her last was 05/07/2016 she was not due at her at the time of her CPE

## 2019-04-08 NOTE — Telephone Encounter (Signed)
Patient was seen in June for her AWV.  Patient said she was suppose to receive the Cologuard test and she hasn't received it.  She said she's called and come by the office asking about it with no response. Please order Cologuard.

## 2019-04-08 NOTE — Telephone Encounter (Signed)
Patient advised of message below. And stated she understood

## 2019-04-13 ENCOUNTER — Ambulatory Visit (INDEPENDENT_AMBULATORY_CARE_PROVIDER_SITE_OTHER): Payer: PPO | Admitting: Internal Medicine

## 2019-04-13 ENCOUNTER — Other Ambulatory Visit: Payer: Self-pay

## 2019-04-13 VITALS — BP 130/80 | HR 74 | Temp 97.9°F | Wt 207.0 lb

## 2019-04-13 DIAGNOSIS — G8929 Other chronic pain: Secondary | ICD-10-CM

## 2019-04-13 DIAGNOSIS — M545 Low back pain: Secondary | ICD-10-CM | POA: Diagnosis not present

## 2019-04-13 DIAGNOSIS — M459 Ankylosing spondylitis of unspecified sites in spine: Secondary | ICD-10-CM

## 2019-04-13 NOTE — Progress Notes (Signed)
Subjective:    Patient ID: Belinda Calderon, female    DOB: 1960-09-28, 58 y.o.   MRN: [id number]  HPI  Pt presents to the clinic today requesting referral to Dr. Vertell Limber, neurosurgery. She has a history of arthritis in her spine as well as ankylosing spondylitis. She had been seeing Dr. Carloyn Manner who is now retiring. She manages her pain with THC, and Percocet as needed for severe pain. MRI of lumbar spine from 04/2017 showed:  IMPRESSION: Moderately severe to severe right foraminal narrowing at L3-4 appears worse than on the prior MRI. Moderate left and moderate to moderately severe central canal stenosis at L3-4 are stable in appearance.  No change in narrowing in the right subarticular recess and foramen at L5-S1 due to a broad-based protrusion with endplate spurring.  No change in mild to moderate central canal stenosis and bilateral foraminal narrowing at L4-5.  Review of Systems      Past Medical History:  Diagnosis Date  . Ankylosing spondylitis ([place])   . Degenerative disc disease   . Fibromyalgia   . Hypertension   . IBS (irritable bowel syndrome)     Current Outpatient Medications  Medication Sig Dispense Refill  . amLODipine (NORVASC) 5 MG tablet Take 1 tablet by mouth daily 90 tablet 1  . clonazePAM (KLONOPIN) 1 MG tablet Take 1 tablet (1 mg total) by mouth daily as needed for anxiety. 30 tablet 0  . lisinopril-hydrochlorothiazide (ZESTORETIC) 20-12.5 MG tablet Take 2 tablets by mouth daily 180 tablet 1  . oxyCODONE-acetaminophen (PERCOCET/ROXICET) 5-325 MG tablet oxycodone-acetaminophen 5 mg-325 mg tablet  TAKE 1 TABLET BY MOUTH EVERY 4 (FOUR) HOURS AS NEEDED FOR UP TO 3 DAYS FOR SEVERE PAIN.     No current facility-administered medications for this visit.     Allergies  Allergen Reactions  . Sulfa Antibiotics Hives and Swelling  . Ibuprofen     GI IRRITATION    Family History  Problem Relation Age of Onset  . Arthritis Mother   . Arthritis Father   .  Hyperlipidemia Father   . Stroke Father   . Hypertension Father   . Diabetes Father   . Arthritis Maternal Grandmother   . Arthritis Maternal Grandfather   . Arthritis Paternal Grandmother   . Heart disease Paternal Grandmother   . Stroke Paternal Grandmother   . Hypertension Paternal Grandmother   . Diabetes Paternal Grandmother   . Arthritis Paternal Grandfather   . Hypertension Paternal Grandfather   . Diabetes Paternal Grandfather     Social History   Socioeconomic History  . Marital status: Married    Spouse name: Not on file  . Number of children: Not on file  . Years of education: Not on file  . Highest education level: Not on file  Occupational History  . Not on file  Social Needs  . Financial resource strain: Not on file  . Food insecurity    Worry: Not on file    Inability: Not on file  . Transportation needs    Medical: Not on file    Non-medical: Not on file  Tobacco Use  . Smoking status: Never Smoker  . Smokeless tobacco: Never Used  Substance and Sexual Activity  . Alcohol use: Yes    Comment: rare  . Drug use: Yes    Types: Marijuana    Comment: Cannabis oil  . Sexual activity: Yes  Lifestyle  . Physical activity    Days per week: Not on file  Minutes per session: Not on file  . Stress: Not on file  Relationships  . Social Herbalist on phone: Not on file    Gets together: Not on file    Attends religious service: Not on file    Active member of club or organization: Not on file    Attends meetings of clubs or organizations: Not on file    Relationship status: Not on file  . Intimate partner violence    Fear of current or ex partner: Not on file    Emotionally abused: Not on file    Physically abused: Not on file    Forced sexual activity: Not on file  Other Topics Concern  . Not on file  Social History Narrative  . Not on file     Constitutional: Denies fever, malaise, fatigue, headache or abrupt weight changes.   Respiratory: Denies difficulty breathing, shortness of breath, cough or sputum production.   Cardiovascular: Denies chest pain, chest tightness, palpitations or swelling in the hands or feet.  Musculoskeletal: Pt reports chronic low back pain. Denies decrease in range of motion, difficulty with gait, muscle pain or joint swelling.  Skin: Denies redness, rashes, lesions or ulcercations.  Neurological: Denies numbness, tingling or problems with balance and coordination.    No other specific complaints in a complete review of systems (except as listed in HPI above).  Objective:   Physical Exam   BP 130/80   Pulse 74   Temp 97.9 F (36.6 C) (Temporal)   Wt 93.9 kg   SpO2 98%   BMI 32.42 kg/m  Wt Readings from Last 3 Encounters:  04/13/19 93.9 kg  12/08/18 90.7 kg  10/14/17 95.3 kg    General: Appears her stated age, obese, in NAD. Cardiovascular: Normal rate and rhythm. S1,S2 noted.  No murmur, rubs or gallops noted.  Pulmonary/Chest: Normal effort and positive vesicular breath sounds. No respiratory distress. No wheezes, rales or ronchi noted.  Musculoskeletal: Normal flexion, extension and rotation of the cervical spine. Bony tenderness noted over the lumbar spine.  No difficulty with gait.  Neurological: Alert and oriented.    BMET    Component Value Date/Time   NA 139 12/15/2018 0918   K 3.9 12/15/2018 0918   CL 106 12/15/2018 0918   CO2 25 12/15/2018 0918   GLUCOSE 89 12/15/2018 0918   BUN 18 12/15/2018 0918   CREATININE 0.63 12/15/2018 0918   CALCIUM 9.0 12/15/2018 0918   GFRNONAA >90 09/09/2011 2110   GFRAA >90 09/09/2011 2110    Lipid Panel     Component Value Date/Time   CHOL 157 12/15/2018 0918   TRIG 210.0 (H) 12/15/2018 0918   HDL 32.30 (L) 12/15/2018 0918   CHOLHDL 5 12/15/2018 0918   VLDL 42.0 (H) 12/15/2018 0918   LDLCALC 102 (H) 04/02/2016 1403    CBC    Component Value Date/Time   WBC 8.8 12/15/2018 0918   RBC 4.45 12/15/2018 0918   HGB  13.9 12/15/2018 0918   HCT 41.0 12/15/2018 0918   PLT 279.0 12/15/2018 0918   MCV 92.1 12/15/2018 0918   MCH 31.1 09/09/2011 2110   MCHC 33.8 12/15/2018 0918   RDW 13.2 12/15/2018 0918   LYMPHSABS 2.7 09/09/2011 2110   MONOABS 0.9 09/09/2011 2110   EOSABS 1.0 (H) 09/09/2011 2110   BASOSABS 0.0 09/09/2011 2110    Hgb A1C Lab Results  Component Value Date   HGBA1C 5.5 04/02/2016  Assessment & Plan:   Chronic Low Back Pain, Ankylosing Spondylitis:  Encouraged regular stretching and core strengthening Advised her she should could Percocet prn Alternating heat and ice may also be helpful She will continue to use THC per her discretion at this helps her more than anything else Referral to neurosurgery placed, Dr. Vertell Limber  Return precautions discussed Webb Silversmith, NP

## 2019-04-18 NOTE — Patient Instructions (Signed)

## 2019-04-19 ENCOUNTER — Encounter: Payer: Self-pay | Admitting: Internal Medicine

## 2019-05-26 NOTE — Addendum Note (Signed)
Addended by: Lurlean Nanny on: 05/26/2019 10:22 AM   Modules accepted: Orders

## 2019-05-26 NOTE — Telephone Encounter (Signed)
Cologuard ordered

## 2019-06-09 ENCOUNTER — Ambulatory Visit (INDEPENDENT_AMBULATORY_CARE_PROVIDER_SITE_OTHER): Payer: PPO | Admitting: Internal Medicine

## 2019-06-09 ENCOUNTER — Other Ambulatory Visit: Payer: Self-pay

## 2019-06-09 ENCOUNTER — Encounter: Payer: Self-pay | Admitting: Internal Medicine

## 2019-06-09 VITALS — BP 124/82 | HR 81 | Temp 97.7°F | Wt 207.0 lb

## 2019-06-09 DIAGNOSIS — M797 Fibromyalgia: Secondary | ICD-10-CM

## 2019-06-09 DIAGNOSIS — M456 Ankylosing spondylitis lumbar region: Secondary | ICD-10-CM

## 2019-06-09 DIAGNOSIS — G894 Chronic pain syndrome: Secondary | ICD-10-CM

## 2019-06-09 MED ORDER — HYDROCODONE-ACETAMINOPHEN 10-325 MG PO TABS: 1.0000 | ORAL_TABLET | Freq: Every day | ORAL | 0 refills | Status: DC | PRN

## 2019-06-09 NOTE — Progress Notes (Signed)
Subjective:    Patient ID: Belinda Calderon, female    DOB: 05/02/1961, 58 y.o.   MRN: [id number]  HPI  Pt presents to the clinic today with c/o chronic pain secondary to ankylosing spondylitis and fibromyalgia. She reports she hurts all the time. She has trouble sleeping, changing positions, walking, and bending over. She has tried CBD oil and is taking Hydrocodone daily with some relief. She would like a refill of Hydrocodone today.  Review of Systems      Past Medical History:  Diagnosis Date  . Ankylosing spondylitis ([place])   . Degenerative disc disease   . Fibromyalgia   . Hypertension   . IBS (irritable bowel syndrome)     Current Outpatient Medications  Medication Sig Dispense Refill  . amLODipine (NORVASC) 5 MG tablet Take 1 tablet by mouth daily 90 tablet 1  . clonazePAM (KLONOPIN) 1 MG tablet Take 1 tablet (1 mg total) by mouth daily as needed for anxiety. 30 tablet 0  . lisinopril-hydrochlorothiazide (ZESTORETIC) 20-12.5 MG tablet Take 2 tablets by mouth daily 180 tablet 1  . oxyCODONE-acetaminophen (PERCOCET/ROXICET) 5-325 MG tablet oxycodone-acetaminophen 5 mg-325 mg tablet  TAKE 1 TABLET BY MOUTH EVERY 4 (FOUR) HOURS AS NEEDED FOR UP TO 3 DAYS FOR SEVERE PAIN.     No current facility-administered medications for this visit.    Allergies  Allergen Reactions  . Sulfa Antibiotics Hives and Swelling  . Ibuprofen     GI IRRITATION    Family History  Problem Relation Age of Onset  . Arthritis Mother   . Arthritis Father   . Hyperlipidemia Father   . Stroke Father   . Hypertension Father   . Diabetes Father   . Arthritis Maternal Grandmother   . Arthritis Maternal Grandfather   . Arthritis Paternal Grandmother   . Heart disease Paternal Grandmother   . Stroke Paternal Grandmother   . Hypertension Paternal Grandmother   . Diabetes Paternal Grandmother   . Arthritis Paternal Grandfather   . Hypertension Paternal Grandfather   . Diabetes Paternal  Grandfather     Social History   Socioeconomic History  . Marital status: Married    Spouse name: Not on file  . Number of children: Not on file  . Years of education: Not on file  . Highest education level: Not on file  Occupational History  . Not on file  Tobacco Use  . Smoking status: Never Smoker  . Smokeless tobacco: Never Used  Substance and Sexual Activity  . Alcohol use: Yes    Comment: rare  . Drug use: Yes    Types: Marijuana    Comment: Cannabis oil  . Sexual activity: Yes  Other Topics Concern  . Not on file  Social History Narrative  . Not on file   Social Determinants of Health   Financial Resource Strain:   . Difficulty of Paying Living Expenses: Not on file  Food Insecurity:   . Worried About Charity fundraiser in the Last Year: Not on file  . Ran Out of Food in the Last Year: Not on file  Transportation Needs:   . Lack of Transportation (Medical): Not on file  . Lack of Transportation (Non-Medical): Not on file  Physical Activity:   . Days of Exercise per Week: Not on file  . Minutes of Exercise per Session: Not on file  Stress:   . Feeling of Stress : Not on file  Social Connections:   . Frequency of Communication  with Friends and Family: Not on file  . Frequency of Social Gatherings with Friends and Family: Not on file  . Attends Religious Services: Not on file  . Active Member of Clubs or Organizations: Not on file  . Attends Archivist Meetings: Not on file  . Marital Status: Not on file  Intimate Partner Violence:   . Fear of Current or Ex-Partner: Not on file  . Emotionally Abused: Not on file  . Physically Abused: Not on file  . Sexually Abused: Not on file     Constitutional: Denies fever, malaise, fatigue, headache or abrupt weight changes.  Respiratory: Denies difficulty breathing, shortness of breath, cough or sputum production.   Cardiovascular: Denies chest pain, chest tightness, palpitations or swelling in the hands  or feet.  Musculoskeletal: Pt reports chronic muscle and joint pain. Denies joint swelling.  Skin: Denies redness, rashes, lesions or ulcercations.    No other specific complaints in a complete review of systems (except as listed in HPI above).  Objective:   Physical Exam  BP 124/82   Pulse 81   Temp 97.7 F (36.5 C) (Temporal)   Wt 207 lb (93.9 kg)   SpO2 98%   BMI 32.42 kg/m   Wt Readings from Last 3 Encounters:  04/13/19 207 lb (93.9 kg)  12/08/18 200 lb (90.7 kg)  10/14/17 210 lb (95.3 kg)    General: Appears her stated age, obese, in NAD. Cardiovascular: Normal rate and rhythm.  Pulmonary/Chest: Normal effort and positive vesicular breath sounds. No respiratory distress. No wheezes, rales or ronchi noted.  Musculoskeletal: Obvious trouble going from a sitting to a standing position. Pain with flexion, extension and rotation of the spine. Strength 5/5 BLE. Able to stand on heels and toes. Gait slow and steady without device.  Neurological: Alert and oriented. Coordination normal.    BMET    Component Value Date/Time   NA 139 12/15/2018 0918   K 3.9 12/15/2018 0918   CL 106 12/15/2018 0918   CO2 25 12/15/2018 0918   GLUCOSE 89 12/15/2018 0918   BUN 18 12/15/2018 0918   CREATININE 0.63 12/15/2018 0918   CALCIUM 9.0 12/15/2018 0918   GFRNONAA >90 09/09/2011 2110   GFRAA >90 09/09/2011 2110    Lipid Panel     Component Value Date/Time   CHOL 157 12/15/2018 0918   TRIG 210.0 (H) 12/15/2018 0918   HDL 32.30 (L) 12/15/2018 0918   CHOLHDL 5 12/15/2018 0918   VLDL 42.0 (H) 12/15/2018 0918   LDLCALC 102 (H) 04/02/2016 1403    CBC    Component Value Date/Time   WBC 8.8 12/15/2018 0918   RBC 4.45 12/15/2018 0918   HGB 13.9 12/15/2018 0918   HCT 41.0 12/15/2018 0918   PLT 279.0 12/15/2018 0918   MCV 92.1 12/15/2018 0918   MCH 31.1 09/09/2011 2110   MCHC 33.8 12/15/2018 0918   RDW 13.2 12/15/2018 0918   LYMPHSABS 2.7 09/09/2011 2110   MONOABS 0.9  09/09/2011 2110   EOSABS 1.0 (H) 09/09/2011 2110   BASOSABS 0.0 09/09/2011 2110    Hgb A1C Lab Results  Component Value Date   HGBA1C 5.5 04/02/2016            Assessment & Plan:    Webb Silversmith, NP This visit occurred during the SARS-CoV-2 public health emergency.  Safety protocols were in place, including screening questions prior to the visit, additional usage of staff PPE, and extensive cleaning of exam room while observing appropriate contact  time as indicated for disinfecting solutions.

## 2019-06-13 ENCOUNTER — Encounter: Payer: Self-pay | Admitting: Internal Medicine

## 2019-06-13 DIAGNOSIS — G894 Chronic pain syndrome: Secondary | ICD-10-CM | POA: Insufficient documentation

## 2019-06-13 NOTE — Assessment & Plan Note (Signed)
RX for Norco 10-325 mg PO daily prn Encouraged regular stretching and physical activity

## 2019-06-13 NOTE — Patient Instructions (Signed)

## 2019-07-06 DIAGNOSIS — H04129 Dry eye syndrome of unspecified lacrimal gland: Secondary | ICD-10-CM | POA: Diagnosis not present

## 2019-07-06 DIAGNOSIS — H35361 Drusen (degenerative) of macula, right eye: Secondary | ICD-10-CM | POA: Diagnosis not present

## 2019-07-06 DIAGNOSIS — H25813 Combined forms of age-related cataract, bilateral: Secondary | ICD-10-CM | POA: Diagnosis not present

## 2019-07-06 DIAGNOSIS — H527 Unspecified disorder of refraction: Secondary | ICD-10-CM | POA: Diagnosis not present

## 2019-07-06 DIAGNOSIS — Z8669 Personal history of other diseases of the nervous system and sense organs: Secondary | ICD-10-CM | POA: Diagnosis not present

## 2019-07-09 ENCOUNTER — Other Ambulatory Visit: Payer: Self-pay

## 2019-07-09 MED ORDER — HYDROCODONE-ACETAMINOPHEN 10-325 MG PO TABS: 1.0000 | ORAL_TABLET | Freq: Every day | ORAL | 0 refills | Status: DC | PRN

## 2019-07-09 NOTE — Telephone Encounter (Signed)
Patient left message on 07/08/19 asking for refill on Hydrocodone medication: Name of Medication: Hydrocodone Name of [facility] [address] Last [place] or Written Date and Quantity: 06/09/19 #30 with 0 refill Last Office Visit and Type: 06/09/2019 routine follow up Next Office Visit and Type: none Last Controlled Substance Agreement Date: none Last UDS: none  Regina Baity's patient, please review in her absence if possible.

## 2019-07-12 ENCOUNTER — Telehealth: Payer: Self-pay | Admitting: Internal Medicine

## 2019-07-12 MED ORDER — LISINOPRIL-HYDROCHLOROTHIAZIDE 20-12.5 MG PO TABS: 2.0000 | ORAL_TABLET | Freq: Every day | ORAL | 1 refills | Status: DC

## 2019-07-12 MED ORDER — AMLODIPINE BESYLATE 5 MG PO TABS: 5.0000 mg | ORAL_TABLET | Freq: Every day | ORAL | 1 refills | Status: DC

## 2019-07-12 NOTE — Telephone Encounter (Signed)
I do not recall receiving any refill request for pt... Rx sent through e-scribe

## 2019-07-12 NOTE — Telephone Encounter (Signed)
Pt called checking on her rx amlodipine and lisinopril  She stated pharmacy has been contacting us for refill  [facility] mail order Pt has 1 week rx left

## 2019-07-13 DIAGNOSIS — Z8669 Personal history of other diseases of the nervous system and sense organs: Secondary | ICD-10-CM | POA: Diagnosis not present

## 2019-08-09 ENCOUNTER — Other Ambulatory Visit: Payer: Self-pay | Admitting: Internal Medicine

## 2019-08-09 NOTE — Telephone Encounter (Signed)
Patient is requesting a refill  HYDROCODONE   Patient has 8 tablets left    [facility] [address] [place]

## 2019-08-09 NOTE — Telephone Encounter (Signed)
Name of Medication: Hydrocodne apap 10-325 mg Name of Pharmacy:[facility]  Last Fill or Written Date and Quantity: # 30 on 07/09/19 by Dr Glori Bickers Last Office Visit and Type:06/09/2019 FU  Next Office Visit and Type: none scheduled Last Controlled Substance Agreement Date:none seen  Last [id number] seen

## 2019-08-10 MED ORDER — HYDROCODONE-ACETAMINOPHEN 10-325 MG PO TABS: 1.0000 | ORAL_TABLET | Freq: Every day | ORAL | 0 refills | Status: DC | PRN

## 2019-08-12 ENCOUNTER — Other Ambulatory Visit: Payer: Self-pay

## 2019-08-12 ENCOUNTER — Encounter: Payer: Self-pay | Admitting: Internal Medicine

## 2019-08-12 ENCOUNTER — Ambulatory Visit (INDEPENDENT_AMBULATORY_CARE_PROVIDER_SITE_OTHER): Payer: PPO | Admitting: Internal Medicine

## 2019-08-12 VITALS — BP 128/84 | HR 87 | Temp 98.2°F | Wt 204.0 lb

## 2019-08-12 DIAGNOSIS — M25412 Effusion, left shoulder: Secondary | ICD-10-CM | POA: Diagnosis not present

## 2019-08-12 DIAGNOSIS — M25512 Pain in left shoulder: Secondary | ICD-10-CM | POA: Diagnosis not present

## 2019-08-12 MED ORDER — KETOROLAC TROMETHAMINE 60 MG/2ML IM SOLN
60.0000 mg | Freq: Once | INTRAMUSCULAR | Status: AC
  Administered 2019-08-12: 17:00:00 60 mg via INTRAMUSCULAR

## 2019-08-12 NOTE — Patient Instructions (Signed)
Proximal Biceps Tendinitis and Tenosynovitis Rehab °Ask your health care provider which exercises are safe for you. Do exercises exactly as told by your health care provider and adjust them as directed. It is normal to feel mild stretching, pulling, tightness, or discomfort as you do these exercises. Stop right away if you feel sudden pain or your pain gets worse. Do not begin these exercises until told by your health care provider. °Stretching and range-of-motion exercises °These exercises warm up your muscles and joints and improve the movement and flexibility of your arm and shoulder. The exercises also help to relieve pain and stiffness. °Forearm rotation °1. Stand or sit with your left / right elbow bent in a 90-degree (right angle). Position your forearm so that the thumb is facing the ceiling (neutral position). °2. Rotate your palm up until it cannot go any farther. °3. Hold this position for __________ seconds. °4. Rotate your palm down until it cannot go any farther. °5. Hold this position for __________ seconds. °Repeat __________ times. Complete this exercise __________ times a day. °Elbow range of motion °1. Stand or sit with your left / right elbow bent in a 90-degree angle (right angle). Position your forearm so that the thumb is facing the ceiling (neutral position). °2. Slowly bend your elbow as far as you can until you feel a stretch or cannot go any farther. °3. Hold this position for __________ seconds. °4. Slowly straighten your elbow as far as you can until you feel a stretch or cannot go any farther. °5. Hold this position for __________ seconds. °Repeat __________ times. Complete this exercise __________ times a day. °Biceps stretch °1. Stand facing a wall, or stand by a door frame. °2. Raise your left / right arm out to your side, to your shoulder height. Place the thumb side of your hand against the wall. Your palm should be facing the floor (palm down). °3. Keeping your arm straight,  rotate your body in the opposite direction of the raised arm until you feel a gentle stretch in your biceps. °4. Hold this position for __________ seconds. °5. Slowly return to the starting position. °Repeat __________ times. Complete this exercise __________ times a day. °Shoulder pendulum ° °1. Stand near a table or counter that you can hold onto for balance. °2. Bend forward at the waist and let your left / right arm hang straight down. Use your other arm to support you and help you stay balanced. °3. Relax your left / right arm and shoulder muscles, and move your hips and your trunk so your left / right arm swings freely. Your arm should swing because of the motion of your body, not because you are using your arm or shoulder muscles. °4. Keep moving your hips and trunk so your arm swings in the following directions, as told by your health care provider: °? Side to side. °? Forward and backward. °? In clockwise and counterclockwise circles. °Repeat __________ times. Complete this exercise __________ times a day. °Shoulder flexion, assisted ° °1. Stand facing a wall. Put your left / right palm on the wall. °2. Slowly move your left / right hand up the wall (flexion). Stop when you feel a stretch in your shoulder, or when you reach the angle that is recommended by your health care provider. °? Use your other hand to help raise your arm, if needed (assisted). °? As your hand gets higher, you may need to step closer to the wall. °? Avoid shrugging or lifting   your shoulder up as you raise your arm. To do this, keep your shoulder blade tucked down toward your spine. °3. Hold this position for __________ seconds. °4. Slowly return to the starting position. Use your other arm to help, if needed. °Repeat __________ times. Complete this exercise __________ times a day. °Shoulder flexion °1. Stand with your left / right arm hanging down at your side. °2. Keep your arm straight as you lift your arm forward and toward the  ceiling (flexion). °3. Hold this position for __________ seconds. °4. Slowly return to the starting position. °Repeat __________ times. Complete this exercise __________ times a day. °Sleeper stretch, assisted °1. Lie on your left / right side (injured side) with your hips and knees bent and your left / right arm straight in front of you. °2. Bend your elbow to a 90-degree angle (right angle), so your fingers are pointing to the ceiling. °3. Use your other hand to gently push your arm toward the floor (assisted), stopping when you feel a gentle stretch. °? Keep your shoulder blades lightly squeezed together during the exercise. °4. Hold this position for __________ seconds. °5. Slowly return to the starting position. °Repeat __________ times. Complete this exercise __________ times a day. °Strengthening exercises °These exercises build strength and endurance in your arm and shoulder. Endurance is the ability to use your muscles for a long time, even after they get tired. °Biceps curls °You can use a weight or an exercise band for this exercise. °1. Sit on a stable chair without armrests, or stand up. °2. Hold a __________ weight in your left / right hand, or hold an exercise band with both hands. Your palms should face up toward the ceiling at the starting position. °3. Bend your left / right elbow and move your hand up toward your shoulder. Keep your other arm straight down, in the starting position. °4. Hold this position for __________ seconds. °5. Slowly return to the starting position. °Repeat __________ times. Complete this exercise __________ times a day. °Internal shoulder rotation °You will use an exercise band secured to a stable object at waist height for this exercise. A door and doorframe work well. °1. Stand sideways next to a door with your left / right arm closest to the door, holding the exercise band in your hand. °2. With your elbow bent in a 90-degree angle (right angle) and keeping your elbow at  your side, bring your hand toward your belly (internal rotation). °? Make sure your wrist is staying straight as you do this exercise. °3. Hold this position for __________ seconds. °4. Slowly return to the starting position. °Repeat __________ times. Complete this exercise __________ times a day. °External shoulder rotation °You will use an exercise band secured to a stable object at waist height for this exercise. A door and doorframe work well. °1. Stand sideways next to a door with your left / right arm away from the door, holding the exercise band in your hand. °2. With your elbow bent in a 90-degree angle (right angle) and keeping your elbow at your side, swing your arm away from your body (external rotation). °? Make sure your wrist is staying straight as you do this exercise. °3. Hold this position for __________ seconds. °4. Slowly return to the starting position. °Repeat __________ times. Complete this exercise __________ times a day. °External shoulder rotation, side-lying °You will use a weight to do this exercise. °1. Lie on your uninjured side with your left / right arm   at your side. Bend your elbow to a 90-degree angle (right angle). Hold a __________ weight in your left / right hand. °2. Keeping your elbow at your side, raise your arm toward the ceiling (external rotation). °? Make sure your wrist is staying straight as you do this exercise. °3. Hold this position for __________ seconds. °4. Slowly return to the starting position. °Repeat __________ times. Complete this exercise __________ times a day. °Scapular retraction °Scapular retraction is the process of pulling the shoulder blades (scapulae) toward each other, and toward the spine. You will need an exercise band to do this exercise. °1. Sit in a stable chair without armrests, or stand up. °2. Secure an exercise band to a stable object in front of you so the band is at shoulder height. °3. Hold one end of the exercise band in each  hand. °4. Squeeze your shoulder blades together and move your elbows slightly behind you (retraction). Do not shrug your shoulders upward while you do this. °5. Hold this position for __________ seconds. °6. Slowly return to the starting position. °Repeat __________ times. Complete this exercise __________ times a day. °Scapular protraction, supine °Scapular protraction is the process of moving your shoulder blades away from each other, and away from the spine, while you lie on your back (supine position). °1. Lie on your back on a firm surface. Hold a __________ weight in your left / right hand. °2. Raise your left / right arm straight into the air so your hand is directly above your shoulder joint. °3. Push the weight into the air so your shoulder (scapula) lifts off the surface that you are lying on. Think of trying to punch the ceiling by only moving your scapula forward (protraction). Do not move your head, neck, or back. °4. Hold this position for __________ seconds. °5. Slowly return to the starting position. °Repeat __________ times. Complete this exercise __________ times a day. °This information is not intended to replace advice given to you by your health care provider. Make sure you discuss any questions you have with your health care provider. °Document Revised: 09/29/2018 Document Reviewed: 06/15/2018 °[employer] Patient Education © 2020 [employer] ° °

## 2019-08-12 NOTE — Progress Notes (Signed)
Subjective:    Patient ID: Belinda Calderon, female    DOB: 1961-04-03, 59 y.o.   MRN: [id number]  HPI  Pt presents to the clinic today with c/o left shoulder pain. This started 2 weeks ago but she reports the pain worsened last night. She describes the pain as throbbing, worse with movement. She denies numbness, tingling or weakness. 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 She reports history of left rotator cuff tear, ankylosing spondylitis and fibromyalgia. She has taken Hydrocodone and Tiger Balm with minimal relief.  Review of Systems      Past Medical History:  Diagnosis Date  . Ankylosing spondylitis ([place])   . Degenerative disc disease   . Fibromyalgia   . Hypertension   . IBS (irritable bowel syndrome)     Current Outpatient Medications  Medication Sig Dispense Refill  . amLODipine (NORVASC) 5 MG tablet Take 1 tablet (5 mg total) by mouth daily. 90 tablet 1  . clonazePAM (KLONOPIN) 1 MG tablet Take 1 tablet (1 mg total) by mouth daily as needed for anxiety. 30 tablet 0  . HYDROcodone-acetaminophen (NORCO) 10-325 MG tablet Take 1 tablet by mouth daily as needed. 30 tablet 0  . lisinopril-hydrochlorothiazide (ZESTORETIC) 20-12.5 MG tablet Take 2 tablets by mouth daily. 180 tablet 1   No current facility-administered medications for this visit.    Allergies  Allergen Reactions  . Sulfa Antibiotics Hives and Swelling  . Ibuprofen     GI IRRITATION    Family History  Problem Relation Age of Onset  . Arthritis Mother   . Arthritis Father   . Hyperlipidemia Father   . Stroke Father   . Hypertension Father   . Diabetes Father   . Arthritis Maternal Grandmother   . Arthritis Maternal Grandfather   . Arthritis Paternal Grandmother   . Heart disease Paternal Grandmother   . Stroke Paternal Grandmother   . Hypertension Paternal Grandmother   . Diabetes Paternal Grandmother   . Arthritis Paternal Grandfather   .  Hypertension Paternal Grandfather   . Diabetes Paternal Grandfather     Social History   Socioeconomic History  . Marital status: Married    Spouse name: Not on file  . Number of children: Not on file  . Years of education: Not on file  . Highest education level: Not on file  Occupational History  . Not on file  Tobacco Use  . Smoking status: Never Smoker  . Smokeless tobacco: Never Used  Substance and Sexual Activity  . Alcohol use: Yes    Comment: rare  . Drug use: Yes    Types: Marijuana    Comment: Cannabis oil  . Sexual activity: Yes  Other Topics Concern  . Not on file  Social History Narrative  . Not on file   Social Determinants of Health   Financial Resource Strain:   . Difficulty of Paying Living Expenses: Not on file  Food Insecurity:   . Worried About Charity fundraiser in the Last Year: Not on file  . Ran Out of Food in the Last Year: Not on file  Transportation Needs:   . Lack of Transportation (Medical): Not on file  . Lack of Transportation (Non-Medical): Not on file  Physical Activity:   . Days of Exercise per Week: Not on file  . Minutes of Exercise per Session: Not on file  Stress:   . Feeling of Stress : Not on  file  Social Connections:   . Frequency of Communication with Friends and Family: Not on file  . Frequency of Social Gatherings with Friends and Family: Not on file  . Attends Religious Services: Not on file  . Active Member of Clubs or Organizations: Not on file  . Attends Archivist Meetings: Not on file  . Marital Status: Not on file  Intimate Partner Violence:   . Fear of Current or Ex-Partner: Not on file  . Emotionally Abused: Not on file  . Physically Abused: Not on file  . Sexually Abused: Not on file     Constitutional: Denies fever, malaise, fatigue, headache or abrupt weight changes.  Respiratory: Denies difficulty breathing, shortness of breath, cough or sputum production.   Cardiovascular: Denies chest  pain, chest tightness, palpitations or swelling in the hands or feet.  Musculoskeletal: Pt reports left shoulder pain, swelling and decrease ROM. Denies difficulty with gait, muscle pain.  Neurological: Denies numbness, tingling, weakness or problems with coordination.    No other specific complaints in a complete review of systems (except as listed in HPI above).  Objective:   Physical Exam BP 128/84   Pulse 87   Temp 98.2 F (36.8 C) (Temporal)   Wt 204 lb (92.5 kg)   SpO2 97%   BMI 31.95 kg/m   Wt Readings from Last 3 Encounters:  06/09/19 207 lb (93.9 kg)  04/13/19 207 lb (93.9 kg)  12/08/18 200 lb (90.7 kg)    General: Appears her stated age, obese, in NAD. Musculoskeletal: Decreased internal and external rotation of the left shoulder due to pain. Obvious swelling over the anterior shoulder. Pain with palpation of the anterior proximal biceps tendon. Unable to get her arm in the position for a drop can test due to pain. Strength 5/5 BUE. Neurological: Alert and oriented.  Psychiatric: Mood and affect normal. Behavior is normal. Judgment and thought content normal.    BMET    Component Value Date/Time   NA 139 12/15/2018 0918   K 3.9 12/15/2018 0918   CL 106 12/15/2018 0918   CO2 25 12/15/2018 0918   GLUCOSE 89 12/15/2018 0918   BUN 18 12/15/2018 0918   CREATININE 0.63 12/15/2018 0918   CALCIUM 9.0 12/15/2018 0918   GFRNONAA >90 09/09/2011 2110   GFRAA >90 09/09/2011 2110    Lipid Panel     Component Value Date/Time   CHOL 157 12/15/2018 0918   TRIG 210.0 (H) 12/15/2018 0918   HDL 32.30 (L) 12/15/2018 0918   CHOLHDL 5 12/15/2018 0918   VLDL 42.0 (H) 12/15/2018 0918   LDLCALC 102 (H) 04/02/2016 1403    CBC    Component Value Date/Time   WBC 8.8 12/15/2018 0918   RBC 4.45 12/15/2018 0918   HGB 13.9 12/15/2018 0918   HCT 41.0 12/15/2018 0918   PLT 279.0 12/15/2018 0918   MCV 92.1 12/15/2018 0918   MCH 31.1 09/09/2011 2110   MCHC 33.8 12/15/2018 0918     RDW 13.2 12/15/2018 0918   LYMPHSABS 2.7 09/09/2011 2110   MONOABS 0.9 09/09/2011 2110   EOSABS 1.0 (H) 09/09/2011 2110   BASOSABS 0.0 09/09/2011 2110    Hgb A1C Lab Results  Component Value Date   HGBA1C 5.5 04/02/2016            Assessment & Plan:  Pain and Swelling of Left Shoulder Pain:  No indication for xray, likely biceps tendonitis 60 mg Toradol IM today Sling placed for comfort She has Hydrocodone  to take as needed for severe pain Encouraged ice, rest  Return precautions discussed  Webb Silversmith, NP This visit occurred during the SARS-CoV-2 public health emergency.  Safety protocols were in place, including screening questions prior to the visit, additional usage of staff PPE, and extensive cleaning of exam room while observing appropriate contact time as indicated for disinfecting solutions.

## 2019-08-12 NOTE — Addendum Note (Signed)
Addended by: Lurlean Nanny on: 08/12/2019 04:28 PM   Modules accepted: Orders

## 2019-08-18 DIAGNOSIS — M25512 Pain in left shoulder: Secondary | ICD-10-CM | POA: Diagnosis not present

## 2019-08-24 ENCOUNTER — Telehealth: Payer: Self-pay | Admitting: Internal Medicine

## 2019-08-24 NOTE — Telephone Encounter (Signed)
Patient is requesting a call back about the cologaurd order. Patient called very upset. Stating that she has asked for this several times and all she gets told is that it has been order and no further instructions. She said now the company is reaching out to her. Patient stated "someone is not doing their job" and that she needs this taken care  Please advise

## 2019-08-24 NOTE — Telephone Encounter (Signed)
I have now resubmitted the order on [employer] as shown below... Previous order was faxed 06/2019...  Cologuard (Order FY:[id number]) Lab Date: 3/9/2021Department: [facility] [phone]: Threasa Beards DevontennoAuthorizing: Cleaster Corin, NP Order Information Priority Order Date Diagnosis Routine Aug 24, 2019 Encounter for screening for malignant neoplasm of colon

## 2019-09-06 ENCOUNTER — Other Ambulatory Visit: Payer: Self-pay

## 2019-09-06 MED ORDER — HYDROCODONE-ACETAMINOPHEN 10-325 MG PO TABS: 1.0000 | ORAL_TABLET | Freq: Every day | ORAL | 0 refills | Status: AC | PRN

## 2019-09-06 NOTE — Telephone Encounter (Signed)
Patient is requesting a refill on Hydrocodone 10-325 mg tablets. This was last refilled on 08/10/19 for #30 with 0 refills. Patient was last seen in the office on 08/12/19 and has no upcoming appointments.  She would like [facility] to [facility] [address].  Is this ok to refill? Thanks!

## 2019-09-25 LAB — COLOGUARD
COLOGUARD: NEGATIVE
Cologuard: NEGATIVE

## 2019-09-28 IMAGING — CR RIGHT FOOT COMPLETE - 3+ VIEW
3 series · 3 of 3 positions shown · non-contrast
Comparison: None

CLINICAL DATA: Foot and ankle injury

EXAM:
RIGHT FOOT COMPLETE - 3+ VIEW

[foot ap]
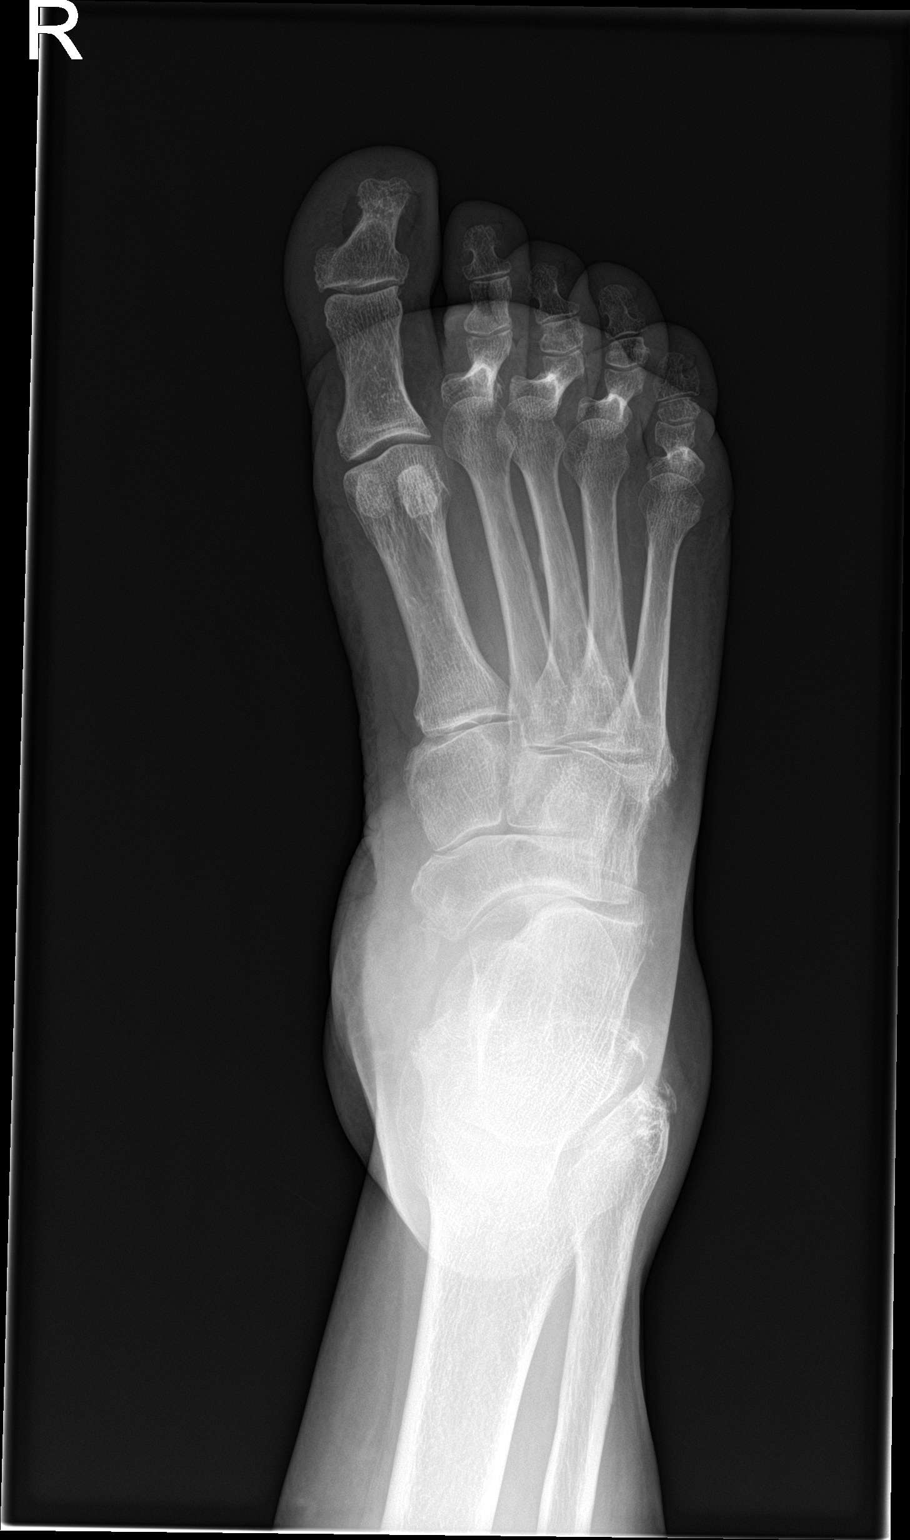

[foot obl]
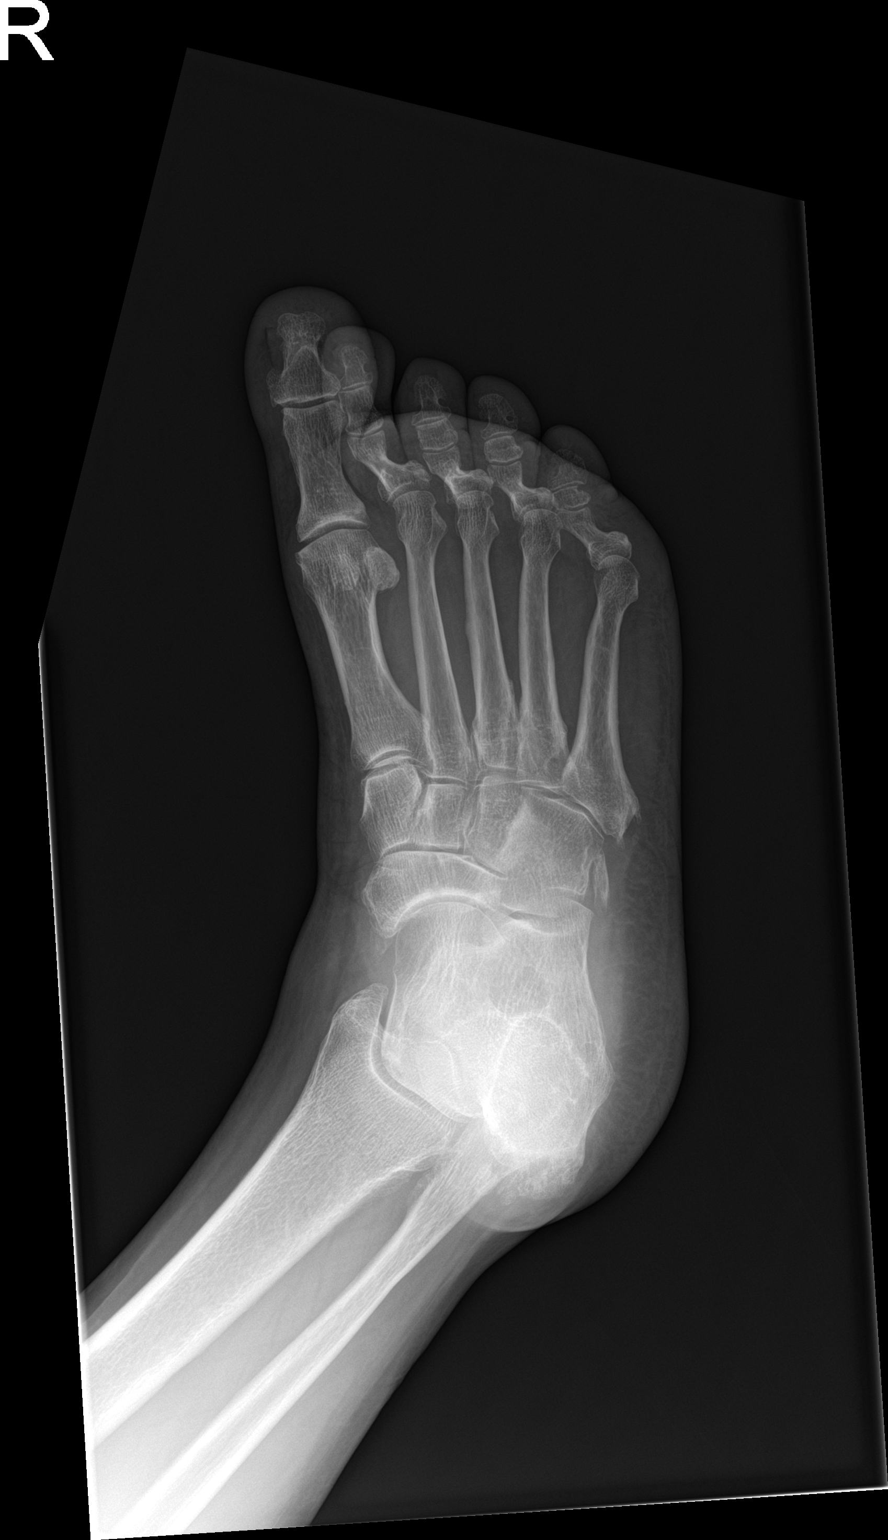

[foot lat]
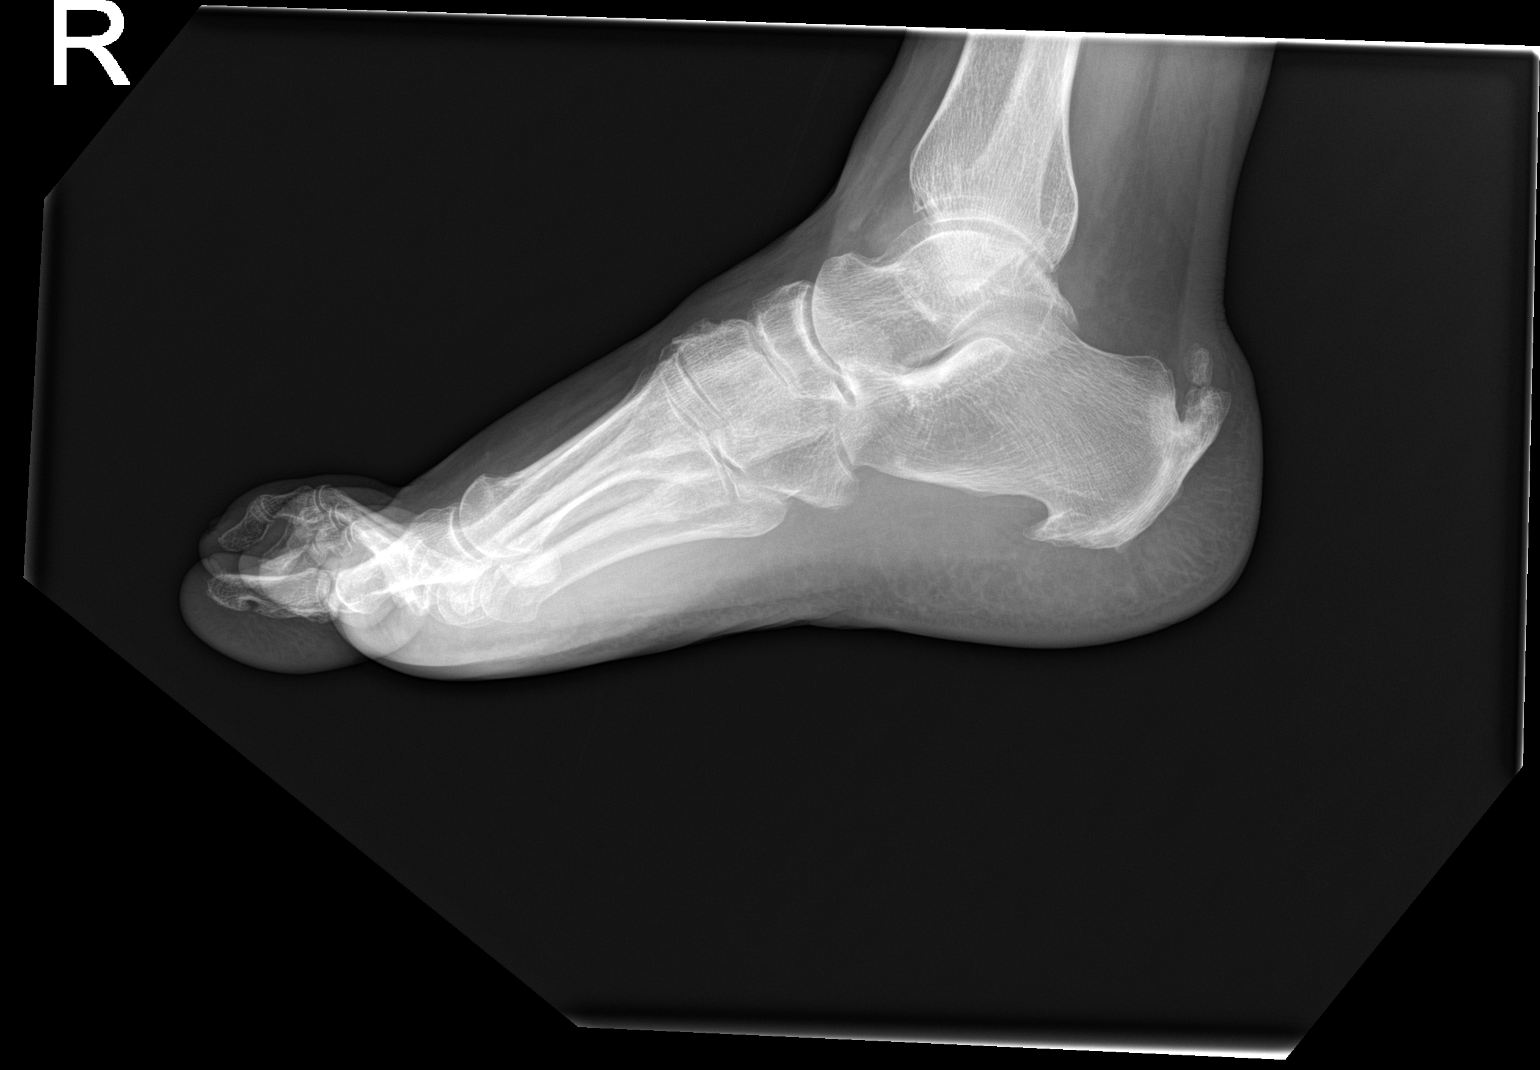

[3 of 3 positions shown; findings below may reference images not displayed]

FINDINGS: Mild osseous demineralization.

Joint spaces preserved.

No acute fracture, dislocation, or bone destruction.

Mildly displaced avulsion fracture at tip of lateral malleolus.

Cortical margination at the lateral margin of the distal calcaneus
is also identified, cannot exclude additional avulsion fracture.

Accessory ossicles adjacent to the lateral margin of cuboid and at
medial margin of tarsal navicular.

No additional fracture, dislocation or bone destruction.
IMPRESSION: Avulsion fracture at tip of lateral malleolus.

Question capsular avulsion fracture at distal aspect of calcaneus
laterally at calcaneocuboid joint.

## 2019-09-28 IMAGING — CT CT HEAD WITHOUT CONTRAST
3 of 4 series · 13 of 47 positions shown, 15 images · non-contrast
Comparison: None.

CLINICAL DATA: Head trauma status post fall

EXAM:
CT HEAD WITHOUT CONTRAST
TECHNIQUE: Contiguous axial images were obtained from the base of the skull
through the vertex without intravenous contrast.

[Series 3: head without · axial · non-contrast · 0.47mm/px · z∈[-650,-515]mm · 7 of 37 slices shown, 9 images]
[im 5/37  brain]
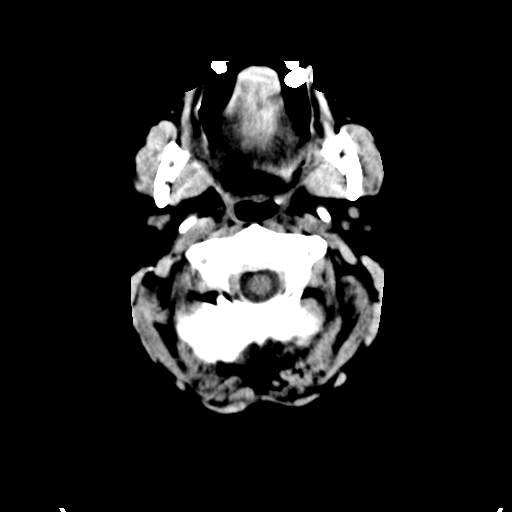
[im 5/37  bone]
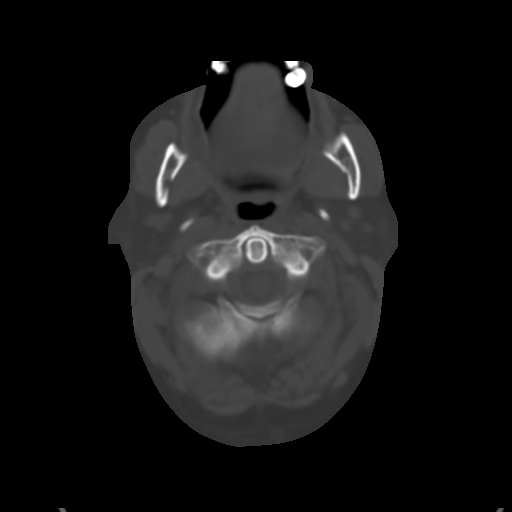
[im 10/37  brain]
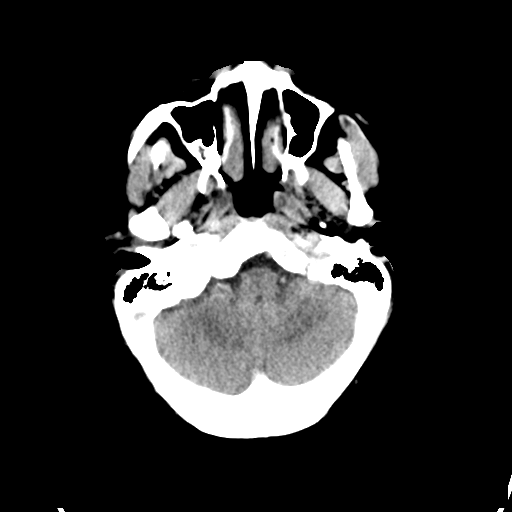
[im 14/37  brain]
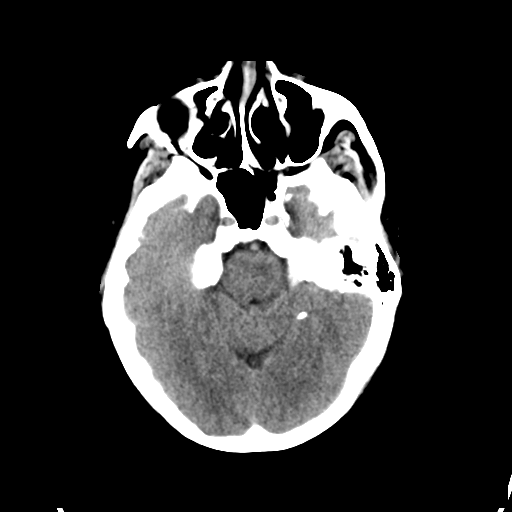
[im 19/37  brain]
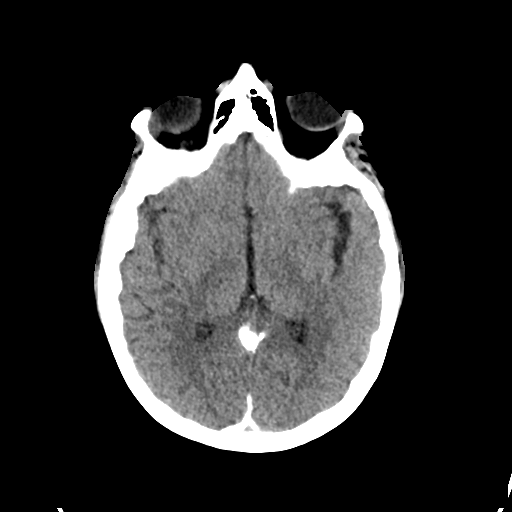
[im 23/37  brain]
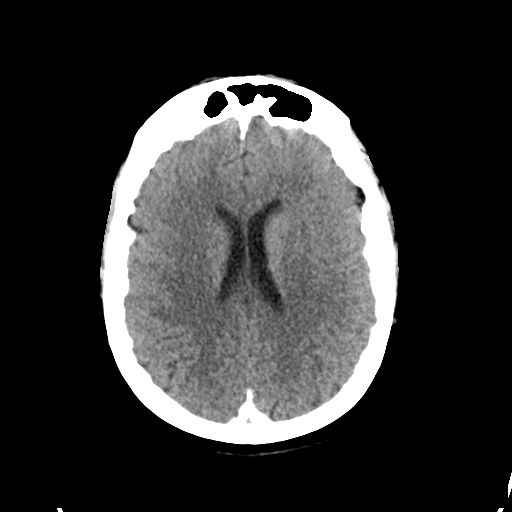
[im 23/37  bone]
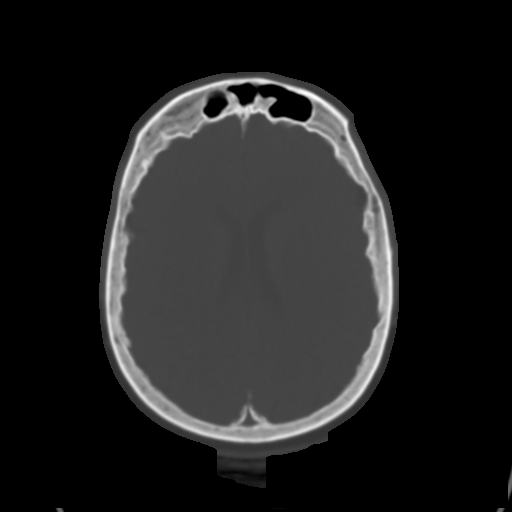
[im 28/37  brain]
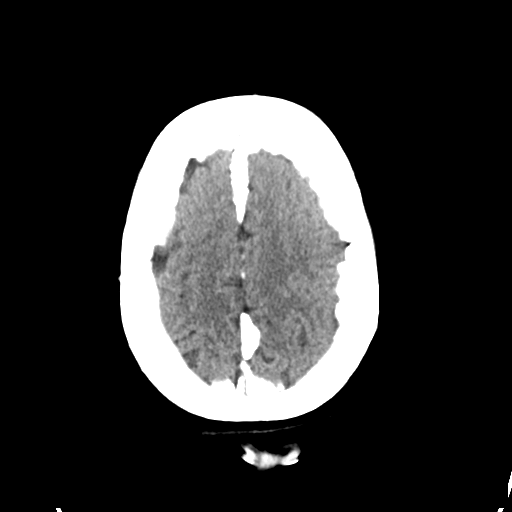
[im 32/37  brain]
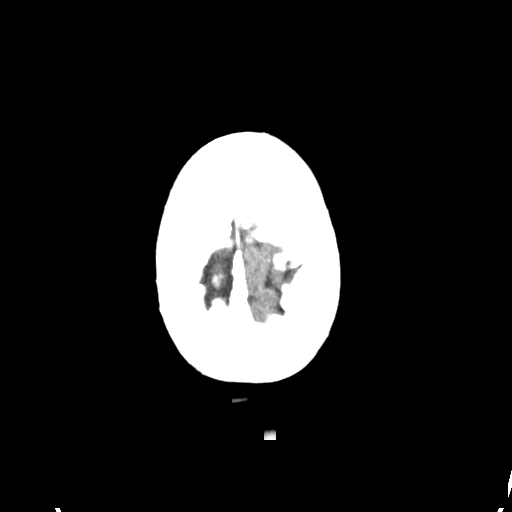

[Series 5: head without cor · coronal · non-contrast · 0.36mm/px · 3 of 67 slices shown]
[im 23/67  brain]
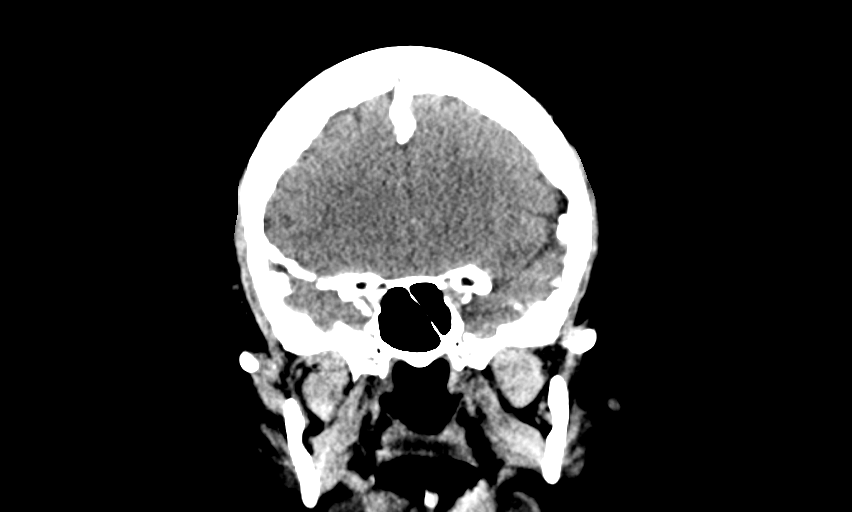
[im 30/67  brain]
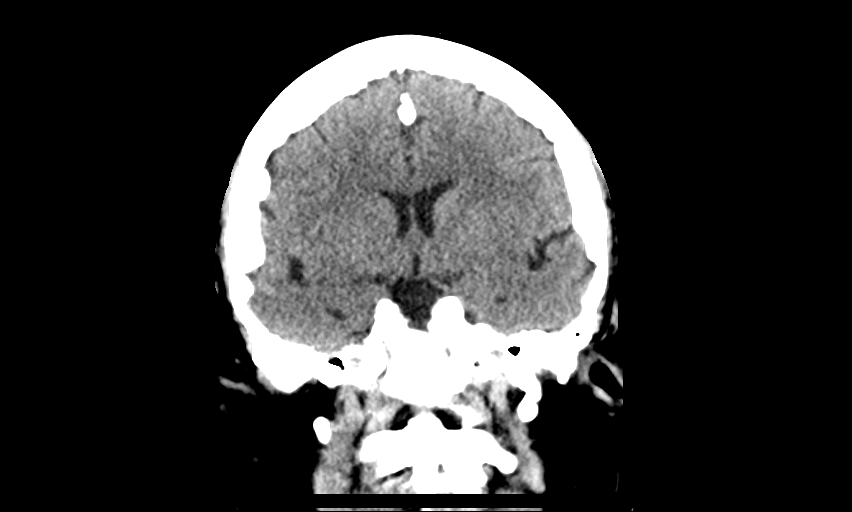
[im 37/67  brain]
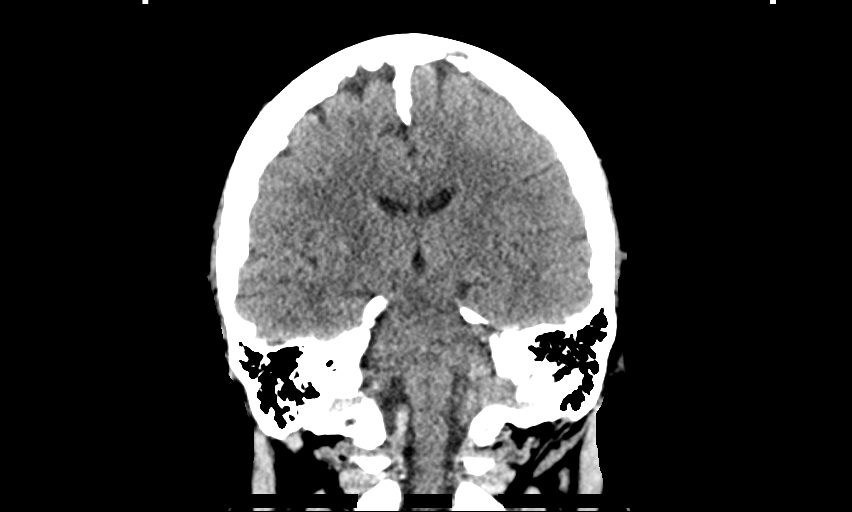

[Series 6: head without sag · sagittal · non-contrast · 0.36mm/px · 3 of 67 slices shown]
[im 23/67  brain]
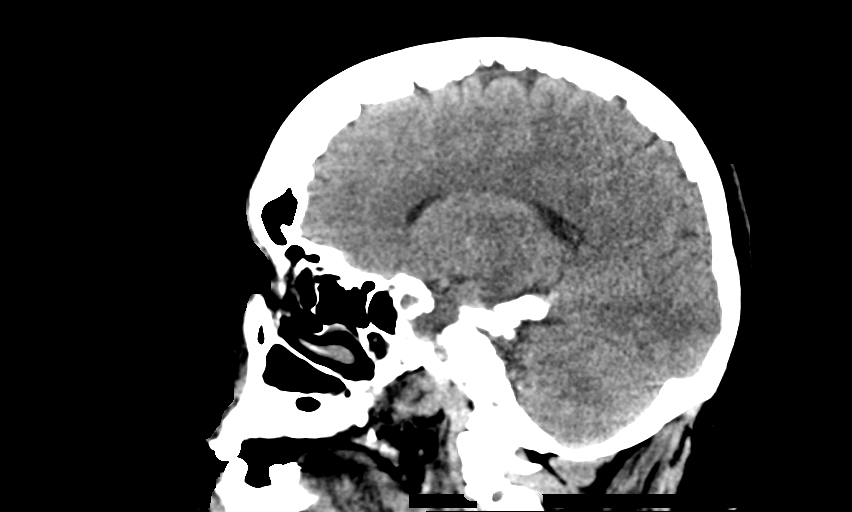
[im 34/67  brain]
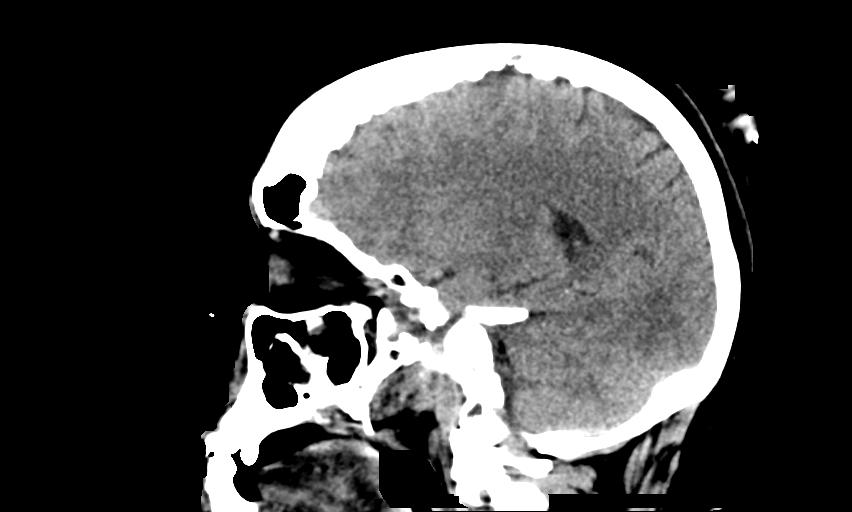
[im 45/67  brain]
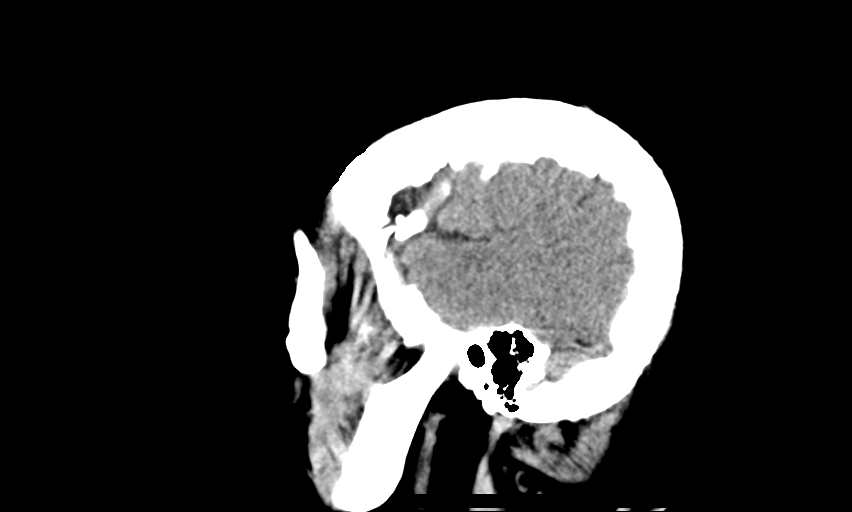

[13 of 47 positions shown; findings below may reference images not displayed]

FINDINGS: Brain: No evidence of acute infarction, hemorrhage, hydrocephalus,
extra-axial collection or mass lesion/mass effect.

Vascular: No hyperdense vessel or unexpected calcification.

Skull: No osseous abnormality.

Sinuses/Orbits: Visualized paranasal sinuses are clear. Visualized
mastoid sinuses are clear. Visualized orbits demonstrate no focal
abnormality.

Other: None
IMPRESSION: No acute intracranial pathology.

## 2019-10-07 ENCOUNTER — Other Ambulatory Visit: Payer: Self-pay

## 2019-10-07 ENCOUNTER — Telehealth: Payer: Self-pay | Admitting: Internal Medicine

## 2019-10-07 MED ORDER — HYDROCODONE-ACETAMINOPHEN 10-325 MG PO TABS: 1.0000 | ORAL_TABLET | Freq: Every day | ORAL | 0 refills | Status: DC | PRN

## 2019-10-07 MED ORDER — CLONAZEPAM 1 MG PO TABS: 1.0000 mg | ORAL_TABLET | Freq: Every day | ORAL | 0 refills | Status: DC | PRN

## 2019-10-07 NOTE — Telephone Encounter (Signed)
Norco last filled 09/06/2019, Klonopin last filled 02/04/2019... please advise

## 2019-10-07 NOTE — Telephone Encounter (Signed)
[facility] in [facility] would need to [facility] us to get clonazepam and hydrocodone refilled. I let her know prescriptions does take 24-48 hours to be filled.

## 2019-10-12 ENCOUNTER — Encounter: Payer: Self-pay | Admitting: Internal Medicine

## 2019-10-12 ENCOUNTER — Telehealth: Payer: Self-pay | Admitting: Internal Medicine

## 2019-10-12 NOTE — Telephone Encounter (Addendum)
Pt calling for Cologuard results.  Pt states that she has received two letters and that she needed to contact the PCP for results - I do not see these abstracted. I apologized for the inconvenience - I advised that sometimes they will notify the patient that results are ready but never send the results to the Provider -- she is aware that we will have to reach out to [employer] to retrieve these

## 2019-10-12 NOTE — Telephone Encounter (Signed)
Accessed Clinical cytogeneticist and printed of results to give to [employer] to review  Cologuard (DNA stool test) result:  NEGATIVE  A negative result indicates a lower likelihood that colorectal cancer or pre-cancer is present.

## 2019-10-13 NOTE — Telephone Encounter (Signed)
Patient is aware of lab results Copy of results sent to be scanned

## 2019-11-05 ENCOUNTER — Other Ambulatory Visit: Payer: Self-pay

## 2019-11-05 MED ORDER — HYDROCODONE-ACETAMINOPHEN 10-325 MG PO TABS: 1.0000 | ORAL_TABLET | Freq: Every day | ORAL | 0 refills | Status: DC | PRN

## 2019-11-05 NOTE — Telephone Encounter (Signed)
Needs 6 month follow up for chronic pain med refill. Will refill

## 2019-11-05 NOTE — Telephone Encounter (Signed)
Patient contacted the office requesting a refill on Hydrocodone. This was last refilled on 10/07/19 for #30 with 0 refills. Patient was last seen 08/12/19 and has no upcoming appts. Is this ok to refill?

## 2019-11-10 NOTE — Telephone Encounter (Signed)
Letter mailed to pt she will due for CPE as well

## 2019-12-07 ENCOUNTER — Other Ambulatory Visit: Payer: Self-pay | Admitting: Internal Medicine

## 2019-12-07 NOTE — Telephone Encounter (Signed)
Patient called requesting refill   HYDROcodone-acetaminophen (NORCO) 10-325 MG tablet   Patient has 1 left    [facility] [address]

## 2019-12-07 NOTE — Telephone Encounter (Signed)
Name of Medication:Hydrocodone apap 10-325 mg  Name [facility] or Written Date and Quantity: # 30 on 11/05/19 Last Office Visit and Type: 08/12/19 acute and  06/09/19 pain mgt Next Office Visit and Type: 12/14/19 annual Last Controlled Substance Agreement Date: none seen Last NUU:VOZD seen

## 2019-12-08 MED ORDER — HYDROCODONE-ACETAMINOPHEN 10-325 MG PO TABS: 1.0000 | ORAL_TABLET | Freq: Every day | ORAL | 0 refills | Status: DC | PRN

## 2019-12-08 NOTE — Telephone Encounter (Signed)
Will refill but needs UDS and CSA

## 2019-12-13 ENCOUNTER — Encounter: Payer: Self-pay | Admitting: Internal Medicine

## 2019-12-13 ENCOUNTER — Other Ambulatory Visit: Payer: Self-pay

## 2019-12-13 ENCOUNTER — Ambulatory Visit (INDEPENDENT_AMBULATORY_CARE_PROVIDER_SITE_OTHER): Payer: PPO | Admitting: Internal Medicine

## 2019-12-13 VITALS — BP 124/78 | HR 73 | Temp 97.8°F | Ht 67.0 in | Wt 202.0 lb

## 2019-12-13 DIAGNOSIS — I10 Essential (primary) hypertension: Secondary | ICD-10-CM | POA: Diagnosis not present

## 2019-12-13 DIAGNOSIS — Z0000 Encounter for general adult medical examination without abnormal findings: Secondary | ICD-10-CM | POA: Diagnosis not present

## 2019-12-13 DIAGNOSIS — F329 Major depressive disorder, single episode, unspecified: Secondary | ICD-10-CM

## 2019-12-13 DIAGNOSIS — M1711 Unilateral primary osteoarthritis, right knee: Secondary | ICD-10-CM

## 2019-12-13 DIAGNOSIS — M797 Fibromyalgia: Secondary | ICD-10-CM | POA: Diagnosis not present

## 2019-12-13 DIAGNOSIS — K580 Irritable bowel syndrome with diarrhea: Secondary | ICD-10-CM | POA: Diagnosis not present

## 2019-12-13 DIAGNOSIS — Z79899 Other long term (current) drug therapy: Secondary | ICD-10-CM | POA: Diagnosis not present

## 2019-12-13 DIAGNOSIS — G894 Chronic pain syndrome: Secondary | ICD-10-CM | POA: Diagnosis not present

## 2019-12-13 DIAGNOSIS — F419 Anxiety disorder, unspecified: Secondary | ICD-10-CM

## 2019-12-13 DIAGNOSIS — M456 Ankylosing spondylitis lumbar region: Secondary | ICD-10-CM

## 2019-12-13 LAB — CBC
HCT: 39.5 % (ref 36.0–46.0)
Hemoglobin: 13.7 g/dL (ref 12.0–15.0)
MCHC: 34.6 g/dL (ref 30.0–36.0)
MCV: 89.5 fl (ref 78.0–100.0)
Platelets: 267 10*3/uL (ref 150.0–400.0)
RBC: 4.41 Mil/uL (ref 3.87–5.11)
RDW: 12.8 % (ref 11.5–15.5)
WBC: 9.1 10*3/uL (ref 4.0–10.5)

## 2019-12-13 LAB — COMPREHENSIVE METABOLIC PANEL
ALT: 15 U/L (ref 0–35)
AST: 14 U/L (ref 0–37)
Albumin: 4.5 g/dL (ref 3.5–5.2)
Alkaline Phosphatase: 93 U/L (ref 39–117)
BUN: 18 mg/dL (ref 6–23)
CO2: 30 mEq/L (ref 19–32)
Calcium: 9.7 mg/dL (ref 8.4–10.5)
Chloride: 101 mEq/L (ref 96–112)
Creatinine, Ser: 0.7 mg/dL (ref 0.40–1.20)
GFR: 85.78 mL/min (ref >60.00)
Glucose, Bld: 95 mg/dL (ref 70–99)
Potassium: 4.4 mEq/L (ref 3.5–5.1)
Sodium: 138 mEq/L (ref 135–145)
Total Bilirubin: 0.4 mg/dL (ref 0.2–1.2)
Total Protein: 6.9 g/dL (ref 6.0–8.3)

## 2019-12-13 LAB — LIPID PANEL
Cholesterol: 181 mg/dL (ref 0–200)
HDL: 34.3 mg/dL — ABNORMAL LOW (ref >39.00)
NonHDL: 146.2
Total CHOL/HDL Ratio: 5
Triglycerides: 315 mg/dL — ABNORMAL HIGH (ref 0.0–149.0)
VLDL: 63 mg/dL — ABNORMAL HIGH (ref 0.0–40.0)

## 2019-12-13 LAB — HEMOGLOBIN A1C: Hgb A1c MFr Bld: 5.6 % (ref 4.6–6.5)

## 2019-12-13 LAB — LDL CHOLESTEROL, DIRECT: Direct LDL: 105 mg/dL

## 2019-12-13 LAB — VITAMIN D 25 HYDROXY (VIT D DEFICIENCY, FRACTURES): VITD: 29.77 ng/mL — ABNORMAL LOW (ref 30.00–100.00)

## 2019-12-13 NOTE — Assessment & Plan Note (Signed)
Continue Hydrocodone CSA and UDS today

## 2019-12-13 NOTE — Progress Notes (Signed)
HPI  Pt presents to the clinic today for her subsequent annual Medicare Wellness Exam. She is also due to follow up chronic conditions.  Indication for chronic opioid: ankylosing spondlyitis, OA, fibromyalgia Medication and dose: Hydrocodone 10-325 mg 1 tab PO daily prn # pills per month: 30 Last UDS date: never Opioid Treatment Agreement signed (Y/N): No Opioid Treatment Agreement last reviewed with patient:  12/13/19 [facility] reviewed this encounter (include red flags):  12/13/19  Ankylosing Spondylitis/Fibromyalgia/Chronic Back Pain: s/p back surgery. She uses CBD oil and takes Hydrocodone with some relief of symptoms. She follows with Dr. Carloyn Manner.  IBS: Mainly diarrhea. Managed with a daily probiotic. She does not follow with GI.  HTN: Her BP today is 124/78. She is taking Amlodipine and Lisinopril HCT as prescribed. ECG from 01/2017 reviewed.  Anxiety: Persistent. Managed on Clonazepam which she uses rarely. She is not currently seeing a therapist. She denies depression, SI/HI.  Past Medical History:  Diagnosis Date  . Ankylosing spondylitis ([place])   . Degenerative disc disease   . Fibromyalgia   . Hypertension   . IBS (irritable bowel syndrome)     Current Outpatient Medications  Medication Sig Dispense Refill  . amLODipine (NORVASC) 5 MG tablet Take 1 tablet (5 mg total) by mouth daily. 90 tablet 1  . clonazePAM (KLONOPIN) 1 MG tablet Take 1 tablet (1 mg total) by mouth daily as needed for anxiety. 30 tablet 0  . HYDROcodone-acetaminophen (NORCO) 10-325 MG tablet Take 1 tablet by mouth daily as needed. 30 tablet 0  . lisinopril-hydrochlorothiazide (ZESTORETIC) 20-12.5 MG tablet Take 2 tablets by mouth daily. 180 tablet 1   No current facility-administered medications for this visit.    Allergies  Allergen Reactions  . Sulfa Antibiotics Hives and Swelling  . Ibuprofen     GI IRRITATION    Family History  Problem Relation Age of Onset  . Arthritis Mother   . Arthritis  Father   . Hyperlipidemia Father   . Stroke Father   . Hypertension Father   . Diabetes Father   . Arthritis Maternal Grandmother   . Arthritis Maternal Grandfather   . Arthritis Paternal Grandmother   . Heart disease Paternal Grandmother   . Stroke Paternal Grandmother   . Hypertension Paternal Grandmother   . Diabetes Paternal Grandmother   . Arthritis Paternal Grandfather   . Hypertension Paternal Grandfather   . Diabetes Paternal Grandfather     Social History   Socioeconomic History  . Marital status: Married    Spouse name: Not on file  . Number of children: Not on file  . Years of education: Not on file  . Highest education level: Not on file  Occupational History  . Not on file  Tobacco Use  . Smoking status: Never Smoker  . Smokeless tobacco: Never Used  Substance and Sexual Activity  . Alcohol use: Yes    Comment: rare  . Drug use: Yes    Types: Marijuana    Comment: Cannabis oil  . Sexual activity: Yes  Other Topics Concern  . Not on file  Social History Narrative  . Not on file   Social Determinants of Health   Financial Resource Strain:   . Difficulty of Paying Living Expenses:   Food Insecurity:   . Worried About Charity fundraiser in the Last Year:   . Arboriculturist in the Last Year:   Transportation Needs:   . Film/video editor (Medical):   Marland Kitchen Lack of  Transportation (Non-Medical):   Physical Activity:   . Days of Exercise per Week:   . Minutes of Exercise per Session:   Stress:   . Feeling of Stress :   Social Connections:   . Frequency of Communication with Friends and Family:   . Frequency of Social Gatherings with Friends and Family:   . Attends Religious Services:   . Active Member of Clubs or Organizations:   . Attends Archivist Meetings:   Marland Kitchen Marital Status:   Intimate Partner Violence:   . Fear of Current or Ex-Partner:   . Emotionally Abused:   Marland Kitchen Physically Abused:   . Sexually Abused:      Hospitiliaztions:  Health Maintenance:    Flu: never  Tetanus: 08/2011  Covid: never  Mammogram: > 5 years ago  Pap Smear: hysterectomy  Bone Density: > 5 years ago  Colon Screening: 09/2019, Cologuard  Eye Doctor: annually  Dental Exam: as needed   Providers:   PCP: Webb Silversmith, NP  Orthopedist: Dr. Alvan Dame  Physiatrist: Dr. Carloyn Manner   I have personally reviewed and have noted:  1. The patient's medical and social history 2. Their use of alcohol, tobacco or illicit drugs 3. Their current medications and supplement 4. The patient's functional ability including ADL's, fall risks, home safety risks and hearing or visual impairment. 5. Diet and physical activities 6. Evidence for depression or mood disorder  Subjective:   Review of Systems:   Constitutional: Denies fever, malaise, fatigue, headache or abrupt weight changes.  HEENT: Denies eye pain, eye redness, ear pain, ringing in the ears, wax buildup, runny nose, nasal congestion, bloody nose, or sore throat. Respiratory: Denies difficulty breathing, shortness of breath, cough or sputum production.   Cardiovascular: Denies chest pain, chest tightness, palpitations or swelling in the hands or feet.  Gastrointestinal: Pt reports intermittent diarrhea. Denies abdominal pain, bloating, constipation, or blood in the stool.  GU: Denies urgency, frequency, pain with urination, burning sensation, blood in urine, odor or discharge. Musculoskeletal: Pt reports intermittent joint and muscle pain. Denies decrease in range of motion, difficulty with gait, or joint swelling.  Skin: Denies redness, rashes, lesions or ulcercations.  Neurological: Denies dizziness, difficulty with memory, difficulty with speech or problems with balance and coordination.  Psych: Pt has a history of anxiety. Denies depression, SI/HI.  No other specific complaints in a complete review of systems (except as listed in HPI above).  Objective:  PE:   BP  124/78   Pulse 73   Temp 97.8 F (36.6 C) (Temporal)   Ht 5\' 7"  (1.702 m)   Wt 202 lb (91.6 kg)   SpO2 98%   BMI 31.64 kg/m   Wt Readings from Last 3 Encounters:  08/12/19 204 lb (92.5 kg)  06/09/19 207 lb (93.9 kg)  04/13/19 207 lb (93.9 kg)    General: Appears her stated age, obese in NAD. Skin: Warm, dry and intact. Multiple skin tags noted of neck. HEENT: Head: normal shape and size; Eyes: sclera white, no icterus, conjunctiva pink, PERRLA and EOMs intact;  Neck: Neck supple, trachea midline. No masses, lumps or thyromegaly present.  Cardiovascular: Normal rate and rhythm. S1,S2 noted.  No murmur, rubs or gallops noted. No JVD or BLE edema. No carotid bruits noted. Pulmonary/Chest: Normal effort and positive vesicular breath sounds. No respiratory distress. No wheezes, rales or ronchi noted.  Abdomen: Soft and nontender. Normal bowel sounds. No distention or masses noted. Liver, spleen and kidneys non palpable. Musculoskeletal:  Strength  5/5 BUE/BLE. No signs of joint swelling.  Neurological: Alert and oriented. Cranial nerves II-XII grossly intact. Coordination normal.  Psychiatric: Mood and affect normal. Behavior is normal. Judgment and thought content normal.    BMET    Component Value Date/Time   NA 139 12/15/2018 0918   K 3.9 12/15/2018 0918   CL 106 12/15/2018 0918   CO2 25 12/15/2018 0918   GLUCOSE 89 12/15/2018 0918   BUN 18 12/15/2018 0918   CREATININE 0.63 12/15/2018 0918   CALCIUM 9.0 12/15/2018 0918   GFRNONAA >90 09/09/2011 2110   GFRAA >90 09/09/2011 2110    Lipid Panel     Component Value Date/Time   CHOL 157 12/15/2018 0918   TRIG 210.0 (H) 12/15/2018 0918   HDL 32.30 (L) 12/15/2018 0918   CHOLHDL 5 12/15/2018 0918   VLDL 42.0 (H) 12/15/2018 0918   LDLCALC 102 (H) 04/02/2016 1403    CBC    Component Value Date/Time   WBC 8.8 12/15/2018 0918   RBC 4.45 12/15/2018 0918   HGB 13.9 12/15/2018 0918   HCT 41.0 12/15/2018 0918   PLT 279.0  12/15/2018 0918   MCV 92.1 12/15/2018 0918   MCH 31.1 09/09/2011 2110   MCHC 33.8 12/15/2018 0918   RDW 13.2 12/15/2018 0918   LYMPHSABS 2.7 09/09/2011 2110   MONOABS 0.9 09/09/2011 2110   EOSABS 1.0 (H) 09/09/2011 2110   BASOSABS 0.0 09/09/2011 2110    Hgb A1C Lab Results  Component Value Date   HGBA1C 5.5 04/02/2016      Assessment and Plan:   Medicare Annual Wellness Visit:  Diet: She does eat some meat. She consumes fruits and veggies. She rarely eats fried foods. She drinks mostly She drinks green tea,  Physical activity: Sedentary Depression/mood screen: Negative, PHQ 9 score of 0 Hearing: Intact to whispered voice Visual acuity: Grossly normal, performs annual eye exam  ADLs: Capable Fall risk: None Home safety: Good Cognitive evaluation: Intact to orientation, naming, recall and repetition EOL planning: No adv directives, full code/ I agree  Preventative Medicine: Encouraged her to get a flu shot in the fall. Tetanus UTD. She declines Covid vaccine. She no longer needs pap smears. Mammogram and bone density ordered. Cologuard UTD. Encouraged her to consume a balanced diet and exercise regimen. Advised her to see an eye doctor and dentist annually. Will check CBC, CMET, Lipid, A1C and Vit D today. Due dates for screening exam given to patient as part of her AVS.   Next appointment: 6 months, follow up chronic pain   Webb Silversmith, NP This visit occurred during the SARS-CoV-2 public health emergency.  Safety protocols were in place, including screening questions prior to the visit, additional usage of staff PPE, and extensive cleaning of exam room while observing appropriate contact time as indicated for disinfecting solutions.

## 2019-12-13 NOTE — Patient Instructions (Signed)

## 2019-12-13 NOTE — Assessment & Plan Note (Signed)
Stable on rare Clonazepam use CSA and UDS today

## 2019-12-13 NOTE — Assessment & Plan Note (Signed)
Continue Hydrcodone CSA and UDS today

## 2019-12-13 NOTE — Assessment & Plan Note (Signed)
Controlled on Amlodipine and Lisinopril HCT CMET today Will monitor

## 2019-12-13 NOTE — Assessment & Plan Note (Signed)
Continue probiotic OTC

## 2019-12-14 ENCOUNTER — Encounter: Payer: PPO | Admitting: Internal Medicine

## 2019-12-15 LAB — DRUG MONITORING, PANEL 8 WITH CONFIRMATION, URINE
6 Acetylmorphine: NEGATIVE ng/mL (ref <10)
Alcohol Metabolites: NEGATIVE ng/mL
Amphetamines: NEGATIVE ng/mL (ref <500)
Benzodiazepines: NEGATIVE ng/mL (ref <100)
Buprenorphine, Urine: NEGATIVE ng/mL (ref <5)
Cocaine Metabolite: NEGATIVE ng/mL (ref <150)
Codeine: NEGATIVE ng/mL (ref <50)
Creatinine: 116.3 mg/dL
Hydrocodone: 1741 ng/mL — ABNORMAL HIGH (ref <50)
Hydromorphone: 573 ng/mL — ABNORMAL HIGH (ref <50)
MDMA: NEGATIVE ng/mL (ref <500)
Marijuana Metabolite: 2032 ng/mL — ABNORMAL HIGH (ref <5)
Marijuana Metabolite: POSITIVE ng/mL — AB (ref <20)
Morphine: NEGATIVE ng/mL (ref <50)
Norhydrocodone: 1891 ng/mL — ABNORMAL HIGH (ref <50)
Opiates: POSITIVE ng/mL — AB (ref <100)
Oxidant: NEGATIVE ug/mL
Oxycodone: NEGATIVE ng/mL (ref <100)
pH: 7.1 (ref 4.5–9.0)

## 2019-12-15 LAB — DM TEMPLATE

## 2019-12-16 NOTE — Progress Notes (Signed)
° °  Subjective:    Patient ID: Belinda Calderon, female    DOB: Nov 07, 1960, 59 y.o.   MRN: [id number]  HPI I reviewed nurse practitioner's note, was available for consultation, and agree with documentation and plan.     Review of Systems     Objective:   Physical Exam         Assessment & Plan:

## 2019-12-22 ENCOUNTER — Telehealth: Payer: Self-pay | Admitting: Internal Medicine

## 2019-12-22 NOTE — Telephone Encounter (Signed)
Pt would like to go on medication for anxiety. Want to know if Rollene Fare will call something in or if she needs an appointment?

## 2019-12-23 NOTE — Telephone Encounter (Signed)
We do need to discuss. I have no idea what she has tried in the past.

## 2019-12-23 NOTE — Telephone Encounter (Signed)
Pt has appt 7/12 at 4pm

## 2019-12-27 ENCOUNTER — Other Ambulatory Visit: Payer: Self-pay

## 2019-12-27 ENCOUNTER — Ambulatory Visit (INDEPENDENT_AMBULATORY_CARE_PROVIDER_SITE_OTHER): Payer: PPO | Admitting: Internal Medicine

## 2019-12-27 ENCOUNTER — Encounter: Payer: Self-pay | Admitting: Internal Medicine

## 2019-12-27 DIAGNOSIS — F419 Anxiety disorder, unspecified: Secondary | ICD-10-CM

## 2019-12-27 DIAGNOSIS — Z08 Encounter for follow-up examination after completed treatment for malignant neoplasm: Secondary | ICD-10-CM | POA: Diagnosis not present

## 2019-12-27 DIAGNOSIS — F329 Major depressive disorder, single episode, unspecified: Secondary | ICD-10-CM

## 2019-12-27 DIAGNOSIS — Z86006 Personal history of melanoma in-situ: Secondary | ICD-10-CM | POA: Diagnosis not present

## 2019-12-27 DIAGNOSIS — L821 Other seborrheic keratosis: Secondary | ICD-10-CM | POA: Diagnosis not present

## 2019-12-27 DIAGNOSIS — Z1283 Encounter for screening for malignant neoplasm of skin: Secondary | ICD-10-CM | POA: Diagnosis not present

## 2019-12-27 NOTE — Progress Notes (Signed)
Subjective:    Patient ID: Belinda Calderon, female    DOB: 1961-04-07, 59 y.o.   MRN: [id number]  HPI  Pt presents to the clinic today with c/o anxiety. She is currently taking Clonazepam as needed for anxiety. She has been prescribed multiple medications in the past. She feels like Wellbutrin may have helped her. She was also prescribed Sertraline within the last few years, but this did not work well for her. She reports she has recently been taking a Vit D oil and that seems to have really made her feel better the last few days. She is not seeing a therapist. She denies depression, SI/HI.   Review of Systems  Past Medical History:  Diagnosis Date  . Ankylosing spondylitis ([place])   . Degenerative disc disease   . Fibromyalgia   . Hypertension   . IBS (irritable bowel syndrome)     Current Outpatient Medications  Medication Sig Dispense Refill  . amLODipine (NORVASC) 5 MG tablet Take 1 tablet (5 mg total) by mouth daily. 90 tablet 1  . clonazePAM (KLONOPIN) 1 MG tablet Take 1 tablet (1 mg total) by mouth daily as needed for anxiety. 30 tablet 0  . HYDROcodone-acetaminophen (NORCO) 10-325 MG tablet Take 1 tablet by mouth daily as needed. 30 tablet 0  . lisinopril-hydrochlorothiazide (ZESTORETIC) 20-12.5 MG tablet Take 2 tablets by mouth daily. 180 tablet 1   No current facility-administered medications for this visit.    Allergies  Allergen Reactions  . Sulfa Antibiotics Hives and Swelling  . Ibuprofen     GI IRRITATION    Family History  Problem Relation Age of Onset  . Arthritis Mother   . Arthritis Father   . Hyperlipidemia Father   . Stroke Father   . Hypertension Father   . Diabetes Father   . Arthritis Maternal Grandmother   . Arthritis Maternal Grandfather   . Arthritis Paternal Grandmother   . Heart disease Paternal Grandmother   . Stroke Paternal Grandmother   . Hypertension Paternal Grandmother   . Diabetes Paternal Grandmother   . Arthritis Paternal  Grandfather   . Hypertension Paternal Grandfather   . Diabetes Paternal Grandfather     Social History   Socioeconomic History  . Marital status: Married    Spouse name: Not on file  . Number of children: Not on file  . Years of education: Not on file  . Highest education level: Not on file  Occupational History  . Not on file  Tobacco Use  . Smoking status: Never Smoker  . Smokeless tobacco: Never Used  Substance and Sexual Activity  . Alcohol use: Yes    Comment: rare  . Drug use: Yes    Types: Marijuana    Comment: Cannabis oil  . Sexual activity: Yes  Other Topics Concern  . Not on file  Social History Narrative  . Not on file   Social Determinants of Health   Financial Resource Strain:   . Difficulty of Paying Living Expenses:   Food Insecurity:   . Worried About Charity fundraiser in the Last Year:   . Arboriculturist in the Last Year:   Transportation Needs:   . Film/video editor (Medical):   Marland Kitchen Lack of Transportation (Non-Medical):   Physical Activity:   . Days of Exercise per Week:   . Minutes of Exercise per Session:   Stress:   . Feeling of Stress :   Social Connections:   . Frequency  of Communication with Friends and Family:   . Frequency of Social Gatherings with Friends and Family:   . Attends Religious Services:   . Active Member of Clubs or Organizations:   . Attends Archivist Meetings:   Marland Kitchen Marital Status:   Intimate Partner Violence:   . Fear of Current or Ex-Partner:   . Emotionally Abused:   Marland Kitchen Physically Abused:   . Sexually Abused:      Constitutional: Denies fever, malaise, fatigue, headache or abrupt weight changes.  Respiratory: Denies difficulty breathing, shortness of breath, cough or sputum production.   Cardiovascular: Denies chest pain, chest tightness, palpitations or swelling in the hands or feet.  Neurological: Denies dizziness, difficulty with memory, difficulty with speech or problems with balance and  coordination.  Psych: Pt reports anxiety. Denies depression, SI/HI.  No other specific complaints in a complete review of systems (except as listed in HPI above).     Objective:   Physical Exam  BP 118/72   Pulse 78   Temp 98 F (36.7 C) (Temporal)   Wt 202 lb (91.6 kg)   SpO2 98%   BMI 31.64 kg/m   Wt Readings from Last 3 Encounters:  12/13/19 202 lb (91.6 kg)  08/12/19 204 lb (92.5 kg)  06/09/19 207 lb (93.9 kg)    General: Appears her stated age, well developed, well nourished in NAD. Cardiovascular: Normal rate. Pulmonary/Chest: Normal effort. Neurological: Alert and oriented.  Psychiatric: Mildly anxious appearing. Behavior is normal. Judgment and thought content normal.     BMET    Component Value Date/Time   NA 138 12/13/2019 1254   K 4.4 12/13/2019 1254   CL 101 12/13/2019 1254   CO2 30 12/13/2019 1254   GLUCOSE 95 12/13/2019 1254   BUN 18 12/13/2019 1254   CREATININE 0.70 12/13/2019 1254   CALCIUM 9.7 12/13/2019 1254   GFRNONAA >90 09/09/2011 2110   GFRAA >90 09/09/2011 2110    Lipid Panel     Component Value Date/Time   CHOL 181 12/13/2019 1254   TRIG 315.0 (H) 12/13/2019 1254   HDL 34.30 (L) 12/13/2019 1254   CHOLHDL 5 12/13/2019 1254   VLDL 63.0 (H) 12/13/2019 1254   LDLCALC 102 (H) 04/02/2016 1403    CBC    Component Value Date/Time   WBC 9.1 12/13/2019 1254   RBC 4.41 12/13/2019 1254   HGB 13.7 12/13/2019 1254   HCT 39.5 12/13/2019 1254   PLT 267.0 12/13/2019 1254   MCV 89.5 12/13/2019 1254   MCH 31.1 09/09/2011 2110   MCHC 34.6 12/13/2019 1254   RDW 12.8 12/13/2019 1254   LYMPHSABS 2.7 09/09/2011 2110   MONOABS 0.9 09/09/2011 2110   EOSABS 1.0 (H) 09/09/2011 2110   BASOSABS 0.0 09/09/2011 2110    Hgb A1C Lab Results  Component Value Date   HGBA1C 5.6 12/13/2019           Assessment & Plan:    Webb Silversmith, NP This visit occurred during the SARS-CoV-2 public health emergency.  Safety protocols were in place,  including screening questions prior to the visit, additional usage of staff PPE, and extensive cleaning of exam room while observing appropriate contact time as indicated for disinfecting solutions.

## 2019-12-28 NOTE — Patient Instructions (Signed)

## 2019-12-28 NOTE — Assessment & Plan Note (Signed)
Persistent anxiety She will continue Clonazepam as needed, she is aware of sedation/addiction risks She will continue Vit D Oil for now If worsens, could consider restarting Wellbutrin  Return precautions discussed

## 2020-01-07 ENCOUNTER — Telehealth: Payer: Self-pay | Admitting: Internal Medicine

## 2020-01-07 ENCOUNTER — Other Ambulatory Visit: Payer: Self-pay

## 2020-01-07 MED ORDER — HYDROCODONE-ACETAMINOPHEN 10-325 MG PO TABS: 1.0000 | ORAL_TABLET | Freq: Every day | ORAL | 0 refills | Status: DC | PRN

## 2020-01-07 NOTE — Telephone Encounter (Signed)
Last filled 12/08/2019... please advise  

## 2020-01-07 NOTE — Telephone Encounter (Signed)
Patient called for a refill for Hydrocodone.  Patient uses [facility].  Patient's out of medication.

## 2020-02-05 ENCOUNTER — Other Ambulatory Visit: Payer: Self-pay | Admitting: Internal Medicine

## 2020-02-05 MED ORDER — AZITHROMYCIN 250 MG PO TABS
ORAL_TABLET | ORAL | 0 refills | Status: DC
Start: 2020-02-05 — End: 2020-05-09

## 2020-02-05 MED ORDER — HYDROCODONE-HOMATROPINE 5-1.5 MG/5ML PO SYRP: 5.0000 mL | ORAL_SOLUTION | Freq: Three times a day (TID) | ORAL | 0 refills | Status: DC | PRN

## 2020-02-05 MED ORDER — ALBUTEROL SULFATE HFA 108 (90 BASE) MCG/ACT IN AERS
2.0000 | INHALATION_SPRAY | Freq: Four times a day (QID) | RESPIRATORY_TRACT | 0 refills | Status: DC | PRN
Start: 2020-02-05 — End: 2020-06-28

## 2020-02-05 MED ORDER — PREDNISONE 10 MG PO TABS: ORAL_TABLET | ORAL | 0 refills | Status: DC

## 2020-02-08 ENCOUNTER — Other Ambulatory Visit: Payer: Self-pay | Admitting: Internal Medicine

## 2020-02-08 MED ORDER — HYDROCODONE-ACETAMINOPHEN 10-325 MG PO TABS: 1.0000 | ORAL_TABLET | Freq: Every day | ORAL | 0 refills | Status: DC | PRN

## 2020-02-08 NOTE — Telephone Encounter (Signed)
Patient called.  Patient needs a refill on Hydrocodone. Patient is out of medication. Patient uses [facility].

## 2020-02-08 NOTE — Telephone Encounter (Signed)
Name of Medication: hydrocodone apap 10-325 mg Name of Pharmacy[facility] or Written Date and Quantity:# 30 on 01/07/20  Last Office Visit and Type: 12/27/19 anxiety and 12/13/19 medicare wellness Next Office Visit and Type: none scheduled Last Controlled Substance Agreement Date: 12/16/19 Last UDS:12/13/19  Per noted from [place] pt is out of med.

## 2020-02-14 ENCOUNTER — Other Ambulatory Visit: Payer: Self-pay | Admitting: Internal Medicine

## 2020-02-14 MED ORDER — LISINOPRIL-HYDROCHLOROTHIAZIDE 20-12.5 MG PO TABS: 2.0000 | ORAL_TABLET | Freq: Every day | ORAL | 0 refills | Status: DC

## 2020-02-14 MED ORDER — AMLODIPINE BESYLATE 5 MG PO TABS: 5.0000 mg | ORAL_TABLET | Freq: Every day | ORAL | 0 refills | Status: DC

## 2020-02-14 NOTE — Telephone Encounter (Signed)
Patient called in again stating she would like to [facility] [address] up [address]. Patient stated she does not trust other pharmacy. Please advise.

## 2020-02-14 NOTE — Telephone Encounter (Signed)
Patient called to get a refill on Lisinopril and Amlodipine.  [facility] told patient that they requested refills on 01/31/20.  Patient said she's been out of medication for 2 weeks.  Patient is asking for a refill for 30 days be sent to [facility].

## 2020-03-08 ENCOUNTER — Other Ambulatory Visit: Payer: Self-pay | Admitting: Internal Medicine

## 2020-03-10 ENCOUNTER — Other Ambulatory Visit: Payer: Self-pay

## 2020-03-10 MED ORDER — HYDROCODONE-ACETAMINOPHEN 10-325 MG PO TABS
1.0000 | ORAL_TABLET | Freq: Every day | ORAL | 0 refills | Status: DC | PRN
Start: 2020-03-10 — End: 2020-04-10

## 2020-03-10 NOTE — Telephone Encounter (Signed)
Last filled 02/08/2020.... please advise 

## 2020-03-13 ENCOUNTER — Telehealth: Payer: Self-pay | Admitting: Internal Medicine

## 2020-03-13 NOTE — Telephone Encounter (Signed)
Pt called stating she has told this office 5 times to change her pharmacy to [facility].  PLEASE CHANGE!!  Please let pt know when this has been done.

## 2020-04-10 ENCOUNTER — Other Ambulatory Visit: Payer: Self-pay

## 2020-04-10 ENCOUNTER — Telehealth: Payer: Self-pay | Admitting: Internal Medicine

## 2020-04-10 MED ORDER — HYDROCODONE-ACETAMINOPHEN 10-325 MG PO TABS: 1.0000 | ORAL_TABLET | Freq: Every day | ORAL | 0 refills | Status: DC | PRN

## 2020-04-10 NOTE — Telephone Encounter (Signed)
PT called wanted to get her hydrocodone refilled.  [facility], [place], [place] [address]  Phone Number [phone]

## 2020-04-10 NOTE — Telephone Encounter (Signed)
Last filled 03/10/2020...Marland Kitchen please advise

## 2020-04-11 NOTE — Telephone Encounter (Signed)
Pt came to clinic today very upset that her hydrocodone refill was sent to the [facility] on [address]. Pt reports this has happened three times since she has asked for her pharmacy to be changed to the [facility] in [place]. Pt reports she lives 5 minutes up the road and no longer has any reason to go to [place]. She also reports that she has had multiple problems with the pharmacist there and has even had to pay cash for her medication there because he would not take her insurance care. Pt said if this happened again she would leave this practice and let [facility] management. Advised pt this would be corrected in her chart and that pharmacy would be removed and the new pharmacy would be added. Apologized this happened to pt and advised if pt had any further problems to contact this nurse. Pt appreciative and verbalized understanding.  Pt also reported her husband has been receiving bills for a NS apt which she says he called and cancelled which she says she was there when he called so she knows he called. Advised pt this nurse would bring this to the front office supervisor for f/u. Husband's name is Elenore Rota, 10/24/47.   Discussed with front office supervisor concerning NS apt. She will look into it.   Wrong pharmacy has been removed from chart and correct pharmacy has been placed in chart.

## 2020-04-13 NOTE — Telephone Encounter (Signed)
Pt returned call. I advised her that the no show fee has been taken care of.

## 2020-04-13 NOTE — Telephone Encounter (Signed)
Attempted to call patient to discuss NS fee. Her daughter answered, but I do not see her on DPR so I asked her to have [place] call me back when she gets a chance. She states she will have her call the office and ask for me.

## 2020-04-28 ENCOUNTER — Telehealth: Payer: Self-pay | Admitting: Internal Medicine

## 2020-04-28 NOTE — Telephone Encounter (Signed)
Can you call to see if she has taken anything for sleep in the past?

## 2020-04-28 NOTE — Telephone Encounter (Signed)
Pt called w/a severe fibromyalgia flare and says they are worse since having covid. She will only take one pain pill per day, but says she's not sleeping at night so pain meds are not helping.  She says pain level is an 18.  She is requesting something to help her sleep at night so she can try to get this under control b/c she says not sleeping makes it worse.  Pt requests a c/b.  [facility] [place]

## 2020-05-01 NOTE — Telephone Encounter (Signed)
Belinda Calderon has already spoken to pt

## 2020-05-04 ENCOUNTER — Telehealth: Payer: Self-pay | Admitting: Internal Medicine

## 2020-05-04 MED ORDER — AMITRIPTYLINE HCL 25 MG PO TABS: 25.0000 mg | ORAL_TABLET | ORAL | 0 refills | Status: DC | PRN

## 2020-05-04 NOTE — Telephone Encounter (Signed)
Pt is agreeable to trying Rx... VO given per [place]ity to send in Amitriptyline 25mg  at bedtime, 1st night take 1/2 tablet and if no relief try 1 tablet tomorrow... pt expressed understanding... Rx sent through [facility]-scribe

## 2020-05-04 NOTE — Addendum Note (Signed)
Addended by: Lurlean Nanny on: 05/04/2020 04:15 PM   Modules accepted: Orders

## 2020-05-04 NOTE — Telephone Encounter (Signed)
Pt called in she wants a message via mychart or phone call , wanted to know if she needed to come in , she called in on Friday and didn't get a response. Wanted to know she can get something for pain or something to help her sleep due to she had covid , and she a bad flare up on  Last Friday

## 2020-05-04 NOTE — Telephone Encounter (Signed)
We could try Amitriptyline, would help with muscle pain, headaches, sleep and possibly mood. Let me know what you think.

## 2020-05-09 ENCOUNTER — Other Ambulatory Visit: Payer: Self-pay | Admitting: Internal Medicine

## 2020-05-09 MED ORDER — ALPRAZOLAM 0.5 MG PO TABS: 0.5000 mg | ORAL_TABLET | Freq: Every day | ORAL | 0 refills | Status: DC

## 2020-05-09 MED ORDER — HYDROCODONE-ACETAMINOPHEN 10-325 MG PO TABS
1.0000 | ORAL_TABLET | Freq: Every day | ORAL | 0 refills | Status: DC | PRN
Start: 2020-05-09 — End: 2020-06-05

## 2020-06-05 ENCOUNTER — Telehealth: Payer: Self-pay | Admitting: Internal Medicine

## 2020-06-05 ENCOUNTER — Other Ambulatory Visit: Payer: Self-pay | Admitting: Internal Medicine

## 2020-06-05 NOTE — Telephone Encounter (Signed)
Last filled 05/09/2020... please advise

## 2020-06-05 NOTE — Telephone Encounter (Signed)
Pt called in needed the hydrocodone ad xanxex

## 2020-06-05 NOTE — Telephone Encounter (Signed)
Pt called in and and the weekend nurse she told her husband he would need to go to the hospital because he is having a stroke she has a 4000$ bill due to she was told by the weekend nurse and when they got to the hospital they looked at her like she was crazy due to it was nothing wrong.

## 2020-06-06 MED ORDER — HYDROCODONE-ACETAMINOPHEN 10-325 MG PO TABS: 1.0000 | ORAL_TABLET | Freq: Every day | ORAL | 0 refills | Status: DC | PRN

## 2020-06-15 ENCOUNTER — Other Ambulatory Visit: Payer: Self-pay | Admitting: Internal Medicine

## 2020-06-21 ENCOUNTER — Telehealth: Payer: Self-pay | Admitting: Internal Medicine

## 2020-06-21 NOTE — Telephone Encounter (Signed)
Patient called back as per request. Discussed with patient that pharmacy was called and removed. Any medications should all go to [facility] [address]. Issue resolved. Patient expressed extreme gratitude.

## 2020-06-21 NOTE — Telephone Encounter (Signed)
Called patient in regards to pharmacy complaint. Patient came into the office irate stating medications are still being sent to wrong pharmacy. Stated that this has been 6x and she is not happy at all. Pt stated she wanted someone's job, wants something done, etc. Able to calm patient down slightly by removing old pharmacy.Pt expressed gratitude and stated she just wants her medications fixed. After patient left, called old pharmacy and expressed pt concerns and to please stop the automatic requests from this pharmacy. Staff made note in her chart at pharmacy to stop requests from their location and everything should be to [address]. Called patient to notify her of the progress in her case. LVM to call back to discuss this with her. Please get Belinda Calderon when calls back.  Routing to PA as FYI as pt wanted Research scientist (physical sciences) involved as well

## 2020-06-28 ENCOUNTER — Encounter: Payer: Self-pay | Admitting: Internal Medicine

## 2020-06-28 ENCOUNTER — Ambulatory Visit (INDEPENDENT_AMBULATORY_CARE_PROVIDER_SITE_OTHER): Payer: PPO | Admitting: Internal Medicine

## 2020-06-28 ENCOUNTER — Other Ambulatory Visit: Payer: Self-pay

## 2020-06-28 VITALS — BP 124/78 | HR 87 | Temp 97.4°F | Wt 200.0 lb

## 2020-06-28 DIAGNOSIS — R454 Irritability and anger: Secondary | ICD-10-CM

## 2020-06-28 DIAGNOSIS — R519 Headache, unspecified: Secondary | ICD-10-CM

## 2020-06-28 DIAGNOSIS — F419 Anxiety disorder, unspecified: Secondary | ICD-10-CM

## 2020-06-28 DIAGNOSIS — U071 COVID-19: Secondary | ICD-10-CM | POA: Diagnosis not present

## 2020-06-28 DIAGNOSIS — G894 Chronic pain syndrome: Secondary | ICD-10-CM

## 2020-06-28 DIAGNOSIS — G4701 Insomnia due to medical condition: Secondary | ICD-10-CM | POA: Diagnosis not present

## 2020-06-28 DIAGNOSIS — R4189 Other symptoms and signs involving cognitive functions and awareness: Secondary | ICD-10-CM | POA: Diagnosis not present

## 2020-06-28 DIAGNOSIS — R630 Anorexia: Secondary | ICD-10-CM

## 2020-06-28 MED ORDER — ALPRAZOLAM ER 1 MG PO TB24: ORAL_TABLET | ORAL | 0 refills | Status: DC

## 2020-06-28 NOTE — Progress Notes (Addendum)
Subjective:    Patient ID: Belinda Calderon, female    DOB: [birth year], 60 y.o.   MRN: [id number]  HPI  Patient presents the clinic today for follow-up of anxiety.  She reports this has been extreme since contracting COVID. Her main symptoms include brain fog, irritability and insomnia. She reports the only way she can sleep right now is with the use of Alprazolam. She reports poor appetite, but denies weight loss. She has loss her taste and smell. She is having more headaches, her muscle and joint pain is worse- especially with the weather changes and she is having hot flashes. She is currently taking Zinc, [place] tears but this has not helped as much as it has in the past.  She has failed Sertraline, Amitriptyline, Hydroxyzine, Clonazepam and BuSpar in the past.  She is not currently seeing a therapist.  Review of Systems      Past Medical History:  Diagnosis Date  . Ankylosing spondylitis ([place])   . Degenerative disc disease   . Fibromyalgia   . Hypertension   . IBS (irritable bowel syndrome)     Current Outpatient Medications  Medication Sig Dispense Refill  . albuterol (VENTOLIN HFA) 108 (90 Base) MCG/ACT inhaler Inhale 2 puffs into the lungs every 6 (six) hours as needed for wheezing or shortness of breath. 8 g 0  . ALPRAZolam (XANAX) 0.5 MG tablet TAKE 1 TABLET BY MOUTH AT BEDTIME. 30 tablet 0  . amLODipine (NORVASC) 5 MG tablet TAKE 1 TABLET BY MOUTH EVERY DAY 90 tablet 0  . HYDROcodone-acetaminophen (NORCO) 10-325 MG tablet Take 1 tablet by mouth daily as needed. 30 tablet 0  . lisinopril-hydrochlorothiazide (ZESTORETIC) 20-12.5 MG tablet TAKE 2 TABLETS BY MOUTH EVERY DAY 180 tablet 0   No current facility-administered medications for this visit.    Allergies  Allergen Reactions  . Sulfa Antibiotics Hives and Swelling  . Ibuprofen     GI IRRITATION    Family History  Problem Relation Age of Onset  . Arthritis Mother   . Arthritis Father   . Hyperlipidemia Father    . Stroke Father   . Hypertension Father   . Diabetes Father   . Arthritis Maternal Grandmother   . Arthritis Maternal Grandfather   . Arthritis Paternal Grandmother   . Heart disease Paternal Grandmother   . Stroke Paternal Grandmother   . Hypertension Paternal Grandmother   . Diabetes Paternal Grandmother   . Arthritis Paternal Grandfather   . Hypertension Paternal Grandfather   . Diabetes Paternal Grandfather     Social History   Socioeconomic History  . Marital status: Married    Spouse name: Not on file  . Number of children: Not on file  . Years of education: Not on file  . Highest education level: Not on file  Occupational History  . Not on file  Tobacco Use  . Smoking status: Never Smoker  . Smokeless tobacco: Never Used  Substance and Sexual Activity  . Alcohol use: Yes    Comment: rare  . Drug use: Yes    Types: Marijuana    Comment: Cannabis oil  . Sexual activity: Yes  Other Topics Concern  . Not on file  Social History Narrative  . Not on file   Social Determinants of Health   Financial Resource Strain: Not on file  Food Insecurity: Not on file  Transportation Needs: Not on file  Physical Activity: Not on file  Stress: Not on file  Social Connections: Not  on file  Intimate Partner Violence: Not on file     Constitutional: Pt reports fatigue, headaches. Denies fever, malaise, or abrupt weight changes.  HEENT: Pt reports loss of taste/smell. Denies eye pain, eye redness, ear pain, ringing in the ears, wax buildup, runny nose, nasal congestion, bloody nose, or sore throat. Respiratory: Denies difficulty breathing, shortness of breath, cough or sputum production.   Cardiovascular: Denies chest pain, chest tightness, palpitations or swelling in the hands or feet.  Gastrointestinal: Denies abdominal pain, bloating, constipation, diarrhea or blood in the stool.  GU: Denies urgency, frequency, pain with urination, burning sensation, blood in urine, odor  or discharge. Musculoskeletal: Pt reports chronic muscle and joint pain. Denies decrease in range of motion, difficulty with gait, or joint swelling.  Skin: Denies redness, rashes, lesions or ulcercations.  Neurological: Pt reports hot flashes, insomnia. Denies dizziness, difficulty with memory, difficulty with speech or problems with balance and coordination.  Psych: Patient reports anxiety.  Denies depression, SI/HI.  No other specific complaints in a complete review of systems (except as listed in HPI above).  Objective:   Physical Exam   BP 124/78   Pulse 87   Temp (!) 97.4 F (36.3 C) (Temporal)   Wt 200 lb (90.7 kg)   SpO2 98%   BMI 31.32 kg/m   Wt Readings from Last 3 Encounters:  12/27/19 202 lb (91.6 kg)  12/13/19 202 lb (91.6 kg)  08/12/19 204 lb (92.5 kg)    General: Appears her stated age, in NAD. Cardiovascular: Normal rate. Pulmonary/Chest: Normal effort. Musculoskeletal:  No difficulty with gait.  Neurological: Alert and oriented.  Psychiatric: Mood and affect mildly flat. Behavior is normal. Judgment and thought content normal.   EKG:  BMET    Component Value Date/Time   NA 138 12/13/2019 1254   K 4.4 12/13/2019 1254   CL 101 12/13/2019 1254   CO2 30 12/13/2019 1254   GLUCOSE 95 12/13/2019 1254   BUN 18 12/13/2019 1254   CREATININE 0.70 12/13/2019 1254   CALCIUM 9.7 12/13/2019 1254   GFRNONAA >90 09/09/2011 2110   GFRAA >90 09/09/2011 2110    Lipid Panel     Component Value Date/Time   CHOL 181 12/13/2019 1254   TRIG 315.0 (H) 12/13/2019 1254   HDL 34.30 (L) 12/13/2019 1254   CHOLHDL 5 12/13/2019 1254   VLDL 63.0 (H) 12/13/2019 1254   LDLCALC 102 (H) 04/02/2016 1403    CBC    Component Value Date/Time   WBC 9.1 12/13/2019 1254   RBC 4.41 12/13/2019 1254   HGB 13.7 12/13/2019 1254   HCT 39.5 12/13/2019 1254   PLT 267.0 12/13/2019 1254   MCV 89.5 12/13/2019 1254   MCH 31.1 09/09/2011 2110   MCHC 34.6 12/13/2019 1254   RDW 12.8  12/13/2019 1254   LYMPHSABS 2.7 09/09/2011 2110   MONOABS 0.9 09/09/2011 2110   EOSABS 1.0 (H) 09/09/2011 2110   BASOSABS 0.0 09/09/2011 2110    Hgb A1C Lab Results  Component Value Date   HGBA1C 5.6 12/13/2019           Assessment & Plan:  Hot Flashes, Insomnia, Brain Fog, Poor Appetite, Chronic Pain, Frequent Headaches, Irritability, Anxiety secondary to Covid 19:  She had initially want lab workup but has decided against this at this time Will d/c Xanax 0.5 mg RX for Xanax XR 1 mg 1 tab PO daily, then 1 tab daily as needed for breakthrough Encouraged adequate rest, fresh air- possibly take a daily  walk Support offered today  Will continue to monitor symptoms at this time, Return precautions discussed  Webb Silversmith, NP This visit occurred during the SARS-CoV-2 public health emergency.  Safety protocols were in place, including screening questions prior to the visit, additional usage of staff PPE, and extensive cleaning of exam room while observing appropriate contact time as indicated for disinfecting solutions.

## 2020-07-02 ENCOUNTER — Encounter: Payer: Self-pay | Admitting: Internal Medicine

## 2020-07-02 NOTE — Patient Instructions (Signed)
[email or web address]">  Generalized Anxiety Disorder, Adult Generalized anxiety disorder (GAD) is a mental health condition. Unlike normal worries, anxiety related to GAD is not triggered by a specific event. These worries do not fade or get better with time. GAD interferes with relationships, work, and school. GAD symptoms can vary from mild to severe. People with severe GAD can have intense waves of anxiety with physical symptoms that are similar to panic attacks. What are the causes? The exact cause of GAD is not known, but the following are believed to have an impact:  Differences in natural brain chemicals.  Genes passed down from parents to children.  Differences in the way threats are perceived.  Development during childhood.  Personality. What increases the risk? The following factors may make you more likely to develop this condition:  Being female.  Having a family history of anxiety disorders.  Being very shy.  Experiencing very stressful life events, such as the death of a loved one.  Having a very stressful family environment. What are the signs or symptoms? People with GAD often worry excessively about many things in their lives, such as their health and family. Symptoms may also include:  Mental and emotional symptoms: ? Worrying excessively about natural disasters. ? Fear of being late. ? Difficulty concentrating. ? Fears that others are judging your performance.  Physical symptoms: ? Fatigue. ? Headaches, muscle tension, muscle twitches, trembling, or feeling shaky. ? Feeling like your heart is pounding or beating very fast. ? Feeling out of breath or like you cannot take a deep breath. ? Having trouble falling asleep or staying asleep, or experiencing restlessness. ? Sweating. ? Nausea, diarrhea, or irritable bowel syndrome (IBS).  Behavioral symptoms: ? Experiencing erratic moods or irritability. ? Avoidance of new situations. ? Avoidance of  people. ? Extreme difficulty making decisions. How is this diagnosed? This condition is diagnosed based on your symptoms and medical history. You will also have a physical exam. Your health care provider may perform tests to rule out other possible causes of your symptoms. To be diagnosed with GAD, a person must have anxiety that:  Is out of his or her control.  Affects several different aspects of his or her life, such as work and relationships.  Causes distress that makes him or her unable to take part in normal activities.  Includes at least three symptoms of GAD, such as restlessness, fatigue, trouble concentrating, irritability, muscle tension, or sleep problems. Before your health care provider can confirm a diagnosis of GAD, these symptoms must be present more days than they are not, and they must last for 6 months or longer. How is this treated? This condition may be treated with:  Medicine. Antidepressant medicine is usually prescribed for long-term daily control. Anti-anxiety medicines may be added in severe cases, especially when panic attacks occur.  Talk therapy (psychotherapy). Certain types of talk therapy can be helpful in treating GAD by providing support, education, and guidance. Options include: ? Cognitive behavioral therapy (CBT). People learn coping skills and self-calming techniques to ease their physical symptoms. They learn to identify unrealistic thoughts and behaviors and to replace them with more appropriate thoughts and behaviors. ? Acceptance and commitment therapy (ACT). This treatment teaches people how to be mindful as a way to cope with unwanted thoughts and feelings. ? Biofeedback. This process trains you to manage your body's response (physiological response) through breathing techniques and relaxation methods. You will work with a therapist while machines are used to monitor your physical   symptoms.  Stress management techniques. These include yoga,  meditation, and exercise. A mental health specialist can help determine which treatment is best for you. Some people see improvement with one type of therapy. However, other people require a combination of therapies.   Follow these instructions at home: Lifestyle  Maintain a consistent routine and schedule.  Anticipate stressful situations. Create a plan, and allow extra time to work with your plan.  Practice stress management or self-calming techniques that you have learned from your therapist or your health care provider. General instructions  Take over-the-counter and prescription medicines only as told by your health care provider.  Understand that you are likely to have setbacks. Accept this and be kind to yourself as you persist to take better care of yourself.  Recognize and accept your accomplishments, even if you judge them as small.  Keep all follow-up visits as told by your health care provider. This is important. Contact a health care provider if:  Your symptoms do not get better.  Your symptoms get worse.  You have signs of depression, such as: ? A persistently sad or irritable mood. ? Loss of enjoyment in activities that used to bring you joy. ? Change in weight or eating. ? Changes in sleeping habits. ? Avoiding friends or family members. ? Loss of energy for normal tasks. ? Feelings of guilt or worthlessness. Get help right away if:  You have serious thoughts about hurting yourself or others. If you ever feel like you may hurt yourself or others, or have thoughts about taking your own life, get help right away. Go to your nearest emergency department or:  Call your local emergency services (911 in the [place]  Call a suicide crisis helpline, such as the National Suicide Prevention Lifeline at 1-800-273-8255. This is open 24 hours a day in the [place]  Text the Crisis Text Line at 741741 (in the [place] Summary  Generalized anxiety disorder (GAD) is a mental  health condition that involves worry that is not triggered by a specific event.  People with GAD often worry excessively about many things in their lives, such as their health and family.  GAD may cause symptoms such as restlessness, trouble concentrating, sleep problems, frequent sweating, nausea, diarrhea, headaches, and trembling or muscle twitching.  A mental health specialist can help determine which treatment is best for you. Some people see improvement with one type of therapy. However, other people require a combination of therapies. This information is not intended to replace advice given to you by your health care provider. Make sure you discuss any questions you have with your health care provider. Document Revised: 03/24/2019 Document Reviewed: 03/24/2019 Elsevier Patient Education  2021 Elsevier Inc.  

## 2020-07-04 ENCOUNTER — Other Ambulatory Visit: Payer: Self-pay | Admitting: Internal Medicine

## 2020-07-04 MED ORDER — ALPRAZOLAM 1 MG PO TABS: 0.5000 mg | ORAL_TABLET | Freq: Every evening | ORAL | 0 refills | Status: DC | PRN

## 2020-07-04 NOTE — Telephone Encounter (Signed)
XR is not covered by insurance only instant release

## 2020-07-10 ENCOUNTER — Other Ambulatory Visit: Payer: Self-pay | Admitting: Internal Medicine

## 2020-07-10 MED ORDER — HYDROCODONE-ACETAMINOPHEN 10-325 MG PO TABS
1.0000 | ORAL_TABLET | Freq: Every day | ORAL | 0 refills | Status: DC | PRN
Start: 2020-07-10 — End: 2020-08-09

## 2020-07-14 ENCOUNTER — Ambulatory Visit (INDEPENDENT_AMBULATORY_CARE_PROVIDER_SITE_OTHER): Payer: PPO

## 2020-07-14 ENCOUNTER — Other Ambulatory Visit: Payer: Self-pay

## 2020-07-14 ENCOUNTER — Telehealth: Payer: Self-pay | Admitting: Internal Medicine

## 2020-07-14 ENCOUNTER — Ambulatory Visit: Payer: PPO | Admitting: Internal Medicine

## 2020-07-14 DIAGNOSIS — M797 Fibromyalgia: Secondary | ICD-10-CM

## 2020-07-14 DIAGNOSIS — M456 Ankylosing spondylitis lumbar region: Secondary | ICD-10-CM | POA: Diagnosis not present

## 2020-07-14 MED ORDER — KETOROLAC TROMETHAMINE 60 MG/2ML IM SOLN
60.0000 mg | Freq: Once | INTRAMUSCULAR | Status: AC
Start: 2020-07-14 — End: 2020-07-14
  Administered 2020-07-14: 60 mg via INTRAMUSCULAR

## 2020-07-14 NOTE — Progress Notes (Signed)
Per orders of [facility], NP-C, injection of Ketorolac 60 mg given by Lurlean Nanny.  Patient tolerated injection well.

## 2020-07-19 NOTE — Telephone Encounter (Signed)
Open in error

## 2020-07-26 ENCOUNTER — Telehealth: Payer: Self-pay | Admitting: Internal Medicine

## 2020-07-26 ENCOUNTER — Other Ambulatory Visit: Payer: Self-pay

## 2020-07-26 ENCOUNTER — Other Ambulatory Visit: Payer: Self-pay | Admitting: Internal Medicine

## 2020-07-26 ENCOUNTER — Other Ambulatory Visit (INDEPENDENT_AMBULATORY_CARE_PROVIDER_SITE_OTHER): Payer: PPO

## 2020-07-26 DIAGNOSIS — R079 Chest pain, unspecified: Secondary | ICD-10-CM

## 2020-07-26 DIAGNOSIS — R0602 Shortness of breath: Secondary | ICD-10-CM | POA: Diagnosis not present

## 2020-07-26 LAB — COMPREHENSIVE METABOLIC PANEL
ALT: 16 U/L (ref 0–35)
AST: 17 U/L (ref 0–37)
Albumin: 4.4 g/dL (ref 3.5–5.2)
Alkaline Phosphatase: 88 U/L (ref 39–117)
BUN: 19 mg/dL (ref 6–23)
CO2: 29 mEq/L (ref 19–32)
Calcium: 9.8 mg/dL (ref 8.4–10.5)
Chloride: 102 mEq/L (ref 96–112)
Creatinine, Ser: 0.71 mg/dL (ref 0.40–1.20)
GFR: 93.23 mL/min (ref >60.00)
Glucose, Bld: 97 mg/dL (ref 70–99)
Potassium: 4.4 mEq/L (ref 3.5–5.1)
Sodium: 139 mEq/L (ref 135–145)
Total Bilirubin: 0.6 mg/dL (ref 0.2–1.2)
Total Protein: 6.8 g/dL (ref 6.0–8.3)

## 2020-07-26 LAB — CBC
HCT: 40.1 % (ref 36.0–46.0)
Hemoglobin: 13.8 g/dL (ref 12.0–15.0)
MCHC: 34.4 g/dL (ref 30.0–36.0)
MCV: 90 fl (ref 78.0–100.0)
Platelets: 290 10*3/uL (ref 150.0–400.0)
RBC: 4.45 Mil/uL (ref 3.87–5.11)
RDW: 13.2 % (ref 11.5–15.5)
WBC: 9.2 10*3/uL (ref 4.0–10.5)

## 2020-07-26 LAB — TSH: TSH: 0.55 u[IU]/mL (ref 0.35–4.50)

## 2020-07-26 LAB — HIGH SENSITIVITY CRP: CRP, High Sensitivity: 1.67 mg/L (ref 0.000–5.000)

## 2020-07-26 LAB — SEDIMENTATION RATE: Sed Rate: 10 mm/hr (ref 0–30)

## 2020-07-26 LAB — TROPONIN I (HIGH SENSITIVITY): High Sens Troponin I: 3 ng/L (ref 2–17)

## 2020-07-26 NOTE — Telephone Encounter (Signed)
PT CALLED IN WANTED TO KNOW ABOUT GETTING BLOOD WORK THAT REGINA WANTED FOR HER,  SHES COMING INA T 1130

## 2020-07-26 NOTE — Progress Notes (Signed)
Per VO from [facility] order labs for pt lab only appt

## 2020-07-27 ENCOUNTER — Other Ambulatory Visit: Payer: Self-pay

## 2020-07-27 ENCOUNTER — Encounter: Payer: Self-pay | Admitting: Internal Medicine

## 2020-07-27 ENCOUNTER — Ambulatory Visit (INDEPENDENT_AMBULATORY_CARE_PROVIDER_SITE_OTHER): Payer: PPO | Admitting: Internal Medicine

## 2020-07-27 VITALS — BP 128/86 | HR 88 | Temp 97.4°F | Wt 198.0 lb

## 2020-07-27 DIAGNOSIS — F419 Anxiety disorder, unspecified: Secondary | ICD-10-CM | POA: Diagnosis not present

## 2020-07-27 DIAGNOSIS — F410 Panic disorder [episodic paroxysmal anxiety] without agoraphobia: Secondary | ICD-10-CM

## 2020-07-27 DIAGNOSIS — F4310 Post-traumatic stress disorder, unspecified: Secondary | ICD-10-CM | POA: Diagnosis not present

## 2020-07-27 DIAGNOSIS — F319 Bipolar disorder, unspecified: Secondary | ICD-10-CM

## 2020-07-27 LAB — ANA: Anti Nuclear Antibody (ANA): NEGATIVE

## 2020-07-27 LAB — D-DIMER, QUANTITATIVE: D-Dimer, Quant: 0.38 mcg/mL FEU (ref <0.50)

## 2020-07-27 LAB — RHEUMATOID FACTOR: Rheumatoid fact SerPl-aCnc: <14 IU/mL (ref <14)
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
## 2020-07-28 NOTE — Patient Instructions (Signed)
https://www.nimh.nih.gov/health/topics/anxiety-disorders/index.shtml">  Panic Attack A panic attack is when you suddenly feel very afraid, uncomfortable, or nervous (anxious). A panic attack can happen when you are scared or for no reason. A panic attack can feel like a serious problem. It can even feel like a heart attack or stroke. See your doctor when you have a panic attack to make sure you do not have a serious problem. Follow these instructions at home:  Take medicines only as told by your doctor.  If you feel worried or nervous, try not to have caffeine.  Take good care of your health. To do this: ? Eat healthy. Make sure to eat fresh fruits and vegetables, whole grains, lean meats, and low-fat dairy. ? Get enough sleep. Try to sleep for 7-8 hours each night. ? Exercise. Try to be active for 30 minutes 5 or more days a week. ? Do not smoke. Talk to your doctor if you need help quitting. ? Limit how much alcohol you drink:  If you are a woman who is not pregnant: try not to have more than 1 drink a day.  If you are a man: try not to have more than 2 drinks a day.  One drink equals 12 oz of beer, 5 oz of wine, or 1 oz of hard liquor.  Keep all follow-up visits as told by your doctor. This is important.   Contact a doctor if:  Your symptoms do not get better.  Your symptoms get worse.  You are not able to take your medicines as told. Get help right away if:  You have thoughts of hurting yourself or others.  You have symptoms of a panic attack. Do not drive yourself to the hospital. Have someone else drive you or call an ambulance. If you feel like you may hurt yourself or others, or have thoughts about taking your own life, get help right away. You can go to your nearest emergency department or call:  Your local emergency services (911 in the [place]  A suicide crisis helpline, such as the National Suicide Prevention Lifeline at 1-800-273-8255. This is open 24 hours a  day. Summary  A panic attack is when you suddenly feel very afraid, uncomfortable, or nervous (anxious).  See your doctor when you have a panic attack to make sure that you do not have another serious problem.  If you feel like you may hurt yourself or others, get help right away by calling 911. This information is not intended to replace advice given to you by your health care provider. Make sure you discuss any questions you have with your health care provider. Document Revised: 12/02/2019 Document Reviewed: 12/02/2019 Elsevier Patient Education  2021 Elsevier Inc.  

## 2020-08-09 ENCOUNTER — Other Ambulatory Visit: Payer: Self-pay | Admitting: Internal Medicine

## 2020-08-09 MED ORDER — ALPRAZOLAM 1 MG PO TABS: 0.5000 mg | ORAL_TABLET | Freq: Every evening | ORAL | 0 refills | Status: DC | PRN

## 2020-08-09 MED ORDER — HYDROCODONE-ACETAMINOPHEN 10-325 MG PO TABS: 1.0000 | ORAL_TABLET | Freq: Every day | ORAL | 0 refills | Status: DC | PRN

## 2020-08-09 NOTE — Telephone Encounter (Signed)
Pt called in wanted to get a refill on hydrocodone and xanax.Marland Kitchen  [facility] [address]

## 2020-08-09 NOTE — Telephone Encounter (Signed)
Xanax last filled 07/04/2020.... Norco last filled 07/10/2020...Marland Kitchen please advise

## 2020-09-04 ENCOUNTER — Ambulatory Visit (INDEPENDENT_AMBULATORY_CARE_PROVIDER_SITE_OTHER): Payer: PPO | Admitting: Internal Medicine

## 2020-09-04 ENCOUNTER — Encounter: Payer: Self-pay | Admitting: Internal Medicine

## 2020-09-04 ENCOUNTER — Other Ambulatory Visit: Payer: Self-pay

## 2020-09-04 VITALS — BP 124/78 | HR 76 | Temp 98.2°F | Wt 199.0 lb

## 2020-09-04 DIAGNOSIS — M797 Fibromyalgia: Secondary | ICD-10-CM | POA: Diagnosis not present

## 2020-09-04 DIAGNOSIS — M456 Ankylosing spondylitis lumbar region: Secondary | ICD-10-CM

## 2020-09-04 DIAGNOSIS — E669 Obesity, unspecified: Secondary | ICD-10-CM

## 2020-09-04 DIAGNOSIS — G894 Chronic pain syndrome: Secondary | ICD-10-CM | POA: Diagnosis not present

## 2020-09-04 DIAGNOSIS — F419 Anxiety disorder, unspecified: Secondary | ICD-10-CM | POA: Diagnosis not present

## 2020-09-04 DIAGNOSIS — F32A Depression, unspecified: Secondary | ICD-10-CM | POA: Diagnosis not present

## 2020-09-04 NOTE — Progress Notes (Unsigned)
Subjective:    Patient ID: Belinda Calderon, female    DOB: 1960-07-15, 60 y.o.   MRN: [id number]  HPI  Patient presents to the clinic today to follow-up chronic back pain secondary to ankylosing spondylitis and fibromyalgia.  She reports her pain has been worse ever since she got COVID.  She reports she hurts all day every day.  She is having difficulty taking care of her daily household chores due to the pain.  She reports the pain is causing extreme fatigue, anxiety and depression.  She is currently managed on Hydrocodone 10-325 mg tablet daily and Xanax 1 mg as needed.  Review of Systems      Past Medical History:  Diagnosis Date  . Ankylosing spondylitis ([place])   . Degenerative disc disease   . Fibromyalgia   . Hypertension   . IBS (irritable bowel syndrome)     Current Outpatient Medications  Medication Sig Dispense Refill  . ALPRAZolam (XANAX) 1 MG tablet Take 0.5-1 tablets (0.5-1 mg total) by mouth at bedtime as needed for anxiety. 30 tablet 0  . amLODipine (NORVASC) 5 MG tablet TAKE 1 TABLET BY MOUTH EVERY DAY 90 tablet 0  . cholecalciferol (VITAMIN D3) 25 MCG (1000 UNIT) tablet Take 1,000 Units by mouth daily.    Marland Kitchen HYDROcodone-acetaminophen (NORCO) 10-325 MG tablet Take 1 tablet by mouth daily as needed. 30 tablet 0  . lisinopril-hydrochlorothiazide (ZESTORETIC) 20-12.5 MG tablet TAKE 2 TABLETS BY MOUTH EVERY DAY 180 tablet 0   No current facility-administered medications for this visit.    Allergies  Allergen Reactions  . Sulfa Antibiotics Hives and Swelling  . Ibuprofen     GI IRRITATION    Family History  Problem Relation Age of Onset  . Arthritis Mother   . Arthritis Father   . Hyperlipidemia Father   . Stroke Father   . Hypertension Father   . Diabetes Father   . Arthritis Maternal Grandmother   . Arthritis Maternal Grandfather   . Arthritis Paternal Grandmother   . Heart disease Paternal Grandmother   . Stroke Paternal Grandmother   . Hypertension  Paternal Grandmother   . Diabetes Paternal Grandmother   . Arthritis Paternal Grandfather   . Hypertension Paternal Grandfather   . Diabetes Paternal Grandfather     Social History   Socioeconomic History  . Marital status: Married    Spouse name: Not on file  . Number of children: Not on file  . Years of education: Not on file  . Highest education level: Not on file  Occupational History  . Not on file  Tobacco Use  . Smoking status: Never Smoker  . Smokeless tobacco: Never Used  Substance and Sexual Activity  . Alcohol use: Yes    Comment: rare  . Drug use: Yes    Types: Marijuana    Comment: Cannabis oil  . Sexual activity: Yes  Other Topics Concern  . Not on file  Social History Narrative  . Not on file   Social Determinants of Health   Financial Resource Strain: Not on file  Food Insecurity: Not on file  Transportation Needs: Not on file  Physical Activity: Not on file  Stress: Not on file  Social Connections: Not on file  Intimate Partner Violence: Not on file     Constitutional: Patient reports fatigue.  Denies fever, malaise, headache or abrupt weight changes.  Respiratory: Denies difficulty breathing, shortness of breath, cough or sputum production.   Cardiovascular: Denies chest pain, chest  tightness, palpitations or swelling in the hands or feet.  Musculoskeletal: Patient reports chronic joint and muscle pain.  Denies decrease in range of motion, difficulty with gait,  or joint  swelling.  Psych: Patient has a history of anxiety depression.  Denies SI/HI.  No other specific complaints in a complete review of systems (except as listed in HPI above).  Objective:   Physical Exam  BP 124/78   Pulse 76   Temp 98.2 F (36.8 C) (Temporal)   Wt 199 lb (90.3 kg)   SpO2 98%   BMI 31.17 kg/m  Wt Readings from Last 3 Encounters:  09/04/20 199 lb (90.3 kg)  07/27/20 198 lb (89.8 kg)  06/28/20 200 lb (90.7 kg)    General: Appears her stated age,  obese, in NAD. HEENT: Head: normal shape and size; Eyes: sclera white and EOMs intact;  Cardiovascular: Normal rate.   Pulmonary/Chest: Normal effort. Musculoskeletal:  No signs of joint swelling.  She has obvious pain going from a sitting to a standing position.  Gait slow but steady without device. Neurological: Alert and oriented.  Psychiatric: Mildly anxious appearing.  Behavior is normal. Judgment and thought content normal.     BMET    Component Value Date/Time   NA 139 07/26/2020 1141   K 4.4 07/26/2020 1141   CL 102 07/26/2020 1141   CO2 29 07/26/2020 1141   GLUCOSE 97 07/26/2020 1141   BUN 19 07/26/2020 1141   CREATININE 0.71 07/26/2020 1141   CALCIUM 9.8 07/26/2020 1141   GFRNONAA >90 09/09/2011 2110   GFRAA >90 09/09/2011 2110    Lipid Panel     Component Value Date/Time   CHOL 181 12/13/2019 1254   TRIG 315.0 (H) 12/13/2019 1254   HDL 34.30 (L) 12/13/2019 1254   CHOLHDL 5 12/13/2019 1254   VLDL 63.0 (H) 12/13/2019 1254   LDLCALC 102 (H) 04/02/2016 1403    CBC    Component Value Date/Time   WBC 9.2 07/26/2020 1141   RBC 4.45 07/26/2020 1141   HGB 13.8 07/26/2020 1141   HCT 40.1 07/26/2020 1141   PLT 290.0 07/26/2020 1141   MCV 90.0 07/26/2020 1141   MCH 31.1 09/09/2011 2110   MCHC 34.4 07/26/2020 1141   RDW 13.2 07/26/2020 1141   LYMPHSABS 2.7 09/09/2011 2110   MONOABS 0.9 09/09/2011 2110   EOSABS 1.0 (H) 09/09/2011 2110   BASOSABS 0.0 09/09/2011 2110    Hgb A1C Lab Results  Component Value Date   HGBA1C 5.6 12/13/2019            Assessment & Plan:     Webb Silversmith, NP This visit occurred during the SARS-CoV-2 public health emergency.  Safety protocols were in place, including screening questions prior to the visit, additional usage of staff PPE, and extensive cleaning of exam room while observing appropriate contact time as indicated for disinfecting solutions.

## 2020-09-05 ENCOUNTER — Encounter: Payer: Self-pay | Admitting: Internal Medicine

## 2020-09-05 NOTE — Assessment & Plan Note (Signed)
Chronic, deteriorated secondary COVID She is unable to take NSAIDs. Continue Hydrocodone 10-325 mg 1 tab p.o. daily She will start trying beginner yoga

## 2020-09-05 NOTE — Assessment & Plan Note (Signed)
Persistent, deteriorated since Covid Support offered today Continue Xanax 1 mg daily as needed

## 2020-09-05 NOTE — Patient Instructions (Signed)

## 2020-09-05 NOTE — Assessment & Plan Note (Addendum)
Chronic, deteriorated secondary COVID She is unable to take NSAIDs. Continue Hydrocodone 10-325 mg 1 tab p.o. daily She will start trying beginner yoga

## 2020-09-05 NOTE — Assessment & Plan Note (Signed)
Chronic, deteriorated secondary COVID She is unable to take NSAIDs. Continue Hydrocodone 10-325 mg 1 tab p.o. daily She will start trying beginner

## 2020-09-06 ENCOUNTER — Telehealth: Payer: Self-pay | Admitting: Internal Medicine

## 2020-09-06 ENCOUNTER — Other Ambulatory Visit: Payer: Self-pay

## 2020-09-06 NOTE — Telephone Encounter (Signed)
  LAST APPOINTMENT DATE: 09/04/2020   NEXT APPOINTMENT DATE:@Visit  date not found  MEDICATION:hydrocodone  [facility] [address]  Let patient know to contact pharmacy at the end of the day to make sure medication is ready.  Please notify patient to allow 48-72 hours to process  Encourage patient to contact the pharmacy for refills or they can request refills through [facility]:   LAST REFILL:  QTY:  REFILL DATE:    OTHER COMMENTS:    Okay for refill?  Please advise

## 2020-09-06 NOTE — Telephone Encounter (Signed)
Last filled 08/09/2020... please advise

## 2020-09-07 MED ORDER — HYDROCODONE-ACETAMINOPHEN 10-325 MG PO TABS: 1.0000 | ORAL_TABLET | Freq: Every day | ORAL | 0 refills | Status: DC | PRN

## 2020-09-13 ENCOUNTER — Other Ambulatory Visit: Payer: Self-pay | Admitting: Internal Medicine

## 2020-09-25 ENCOUNTER — Other Ambulatory Visit: Payer: Self-pay | Admitting: Internal Medicine

## 2020-09-26 NOTE — Telephone Encounter (Signed)
Last filled 08/09/2020... please advise

## 2020-10-10 ENCOUNTER — Other Ambulatory Visit: Payer: Self-pay

## 2020-10-10 MED ORDER — HYDROCODONE-ACETAMINOPHEN 10-325 MG PO TABS: 1.0000 | ORAL_TABLET | Freq: Every day | ORAL | 0 refills | Status: DC | PRN

## 2020-10-10 NOTE — Telephone Encounter (Signed)
Will obtain UDS and do pain management protocol at her annual exam in June. Please have her schedule CPX

## 2020-10-10 NOTE — Telephone Encounter (Signed)
Requested medication (s) are due for refill today: yes  Requested medication (s) are on the active medication list: yes  Last refill:  09/07/2020  Future visit scheduled:yes  Notes to clinic: this refill cannot be delegated   Requested Prescriptions  Pending Prescriptions Disp Refills   HYDROcodone-acetaminophen (NORCO) 10-325 MG tablet 30 tablet 0    Sig: Take 1 tablet by mouth daily as needed.      Not Delegated - Analgesics:  Opioid Agonist Combinations Failed - 10/10/2020  8:16 AM      Failed - This refill cannot be delegated      Failed - Urine Drug Screen completed in last 360 days      Failed - Valid encounter within last 6 months    Recent Outpatient Visits   None     Future Appointments             In 1 week Baity, Coralie Keens, NP [facility], [facility]

## 2020-10-17 ENCOUNTER — Ambulatory Visit: Payer: Self-pay | Admitting: Internal Medicine

## 2020-11-09 ENCOUNTER — Other Ambulatory Visit: Payer: Self-pay | Admitting: Internal Medicine

## 2020-11-09 MED ORDER — HYDROCODONE-ACETAMINOPHEN 10-325 MG PO TABS: 1.0000 | ORAL_TABLET | Freq: Every day | ORAL | 0 refills | Status: DC | PRN

## 2020-12-11 ENCOUNTER — Ambulatory Visit (INDEPENDENT_AMBULATORY_CARE_PROVIDER_SITE_OTHER): Payer: PPO | Admitting: Internal Medicine

## 2020-12-11 ENCOUNTER — Other Ambulatory Visit: Payer: Self-pay

## 2020-12-11 ENCOUNTER — Other Ambulatory Visit: Payer: Self-pay | Admitting: Internal Medicine

## 2020-12-11 ENCOUNTER — Encounter: Payer: Self-pay | Admitting: Internal Medicine

## 2020-12-11 ENCOUNTER — Telehealth: Payer: Self-pay | Admitting: Internal Medicine

## 2020-12-11 VITALS — BP 122/74 | HR 69 | Temp 97.5°F | Ht 67.0 in | Wt 200.0 lb

## 2020-12-11 DIAGNOSIS — M1711 Unilateral primary osteoarthritis, right knee: Secondary | ICD-10-CM

## 2020-12-11 DIAGNOSIS — F32A Depression, unspecified: Secondary | ICD-10-CM | POA: Diagnosis not present

## 2020-12-11 DIAGNOSIS — M797 Fibromyalgia: Secondary | ICD-10-CM | POA: Diagnosis not present

## 2020-12-11 DIAGNOSIS — G894 Chronic pain syndrome: Secondary | ICD-10-CM

## 2020-12-11 DIAGNOSIS — F419 Anxiety disorder, unspecified: Secondary | ICD-10-CM | POA: Diagnosis not present

## 2020-12-11 DIAGNOSIS — E66811 Other obesity due to excess calories: Secondary | ICD-10-CM | POA: Insufficient documentation

## 2020-12-11 DIAGNOSIS — I10 Essential (primary) hypertension: Secondary | ICD-10-CM

## 2020-12-11 DIAGNOSIS — Z6831 Body mass index (BMI) 31.0-31.9, adult: Secondary | ICD-10-CM

## 2020-12-11 DIAGNOSIS — E6609 Other obesity due to excess calories: Secondary | ICD-10-CM | POA: Insufficient documentation

## 2020-12-11 DIAGNOSIS — Z0001 Encounter for general adult medical examination with abnormal findings: Secondary | ICD-10-CM | POA: Diagnosis not present

## 2020-12-11 DIAGNOSIS — M456 Ankylosing spondylitis lumbar region: Secondary | ICD-10-CM | POA: Diagnosis not present

## 2020-12-11 DIAGNOSIS — K582 Mixed irritable bowel syndrome: Secondary | ICD-10-CM

## 2020-12-11 MED ORDER — HYDROCODONE-ACETAMINOPHEN 10-325 MG PO TABS: 1.0000 | ORAL_TABLET | Freq: Two times a day (BID) | ORAL | 0 refills | Status: DC

## 2020-12-11 MED ORDER — HYDROCODONE-ACETAMINOPHEN 10-325 MG PO TABS: 1.0000 | ORAL_TABLET | Freq: Every day | ORAL | 0 refills | Status: DC | PRN

## 2020-12-11 NOTE — Assessment & Plan Note (Signed)
Will d/c Xanax Support offered

## 2020-12-11 NOTE — Progress Notes (Signed)
Subjective:    Patient ID: Belinda Calderon, female    DOB: 1960/09/01, 60 y.o.   MRN: [id number]  HPI  Patient presents the clinic today for her annual exam.  She is also due to follow-up chronic conditions.  HTN: Her BP today is 122/74.  She is taking Lisinopril-HCT and Amlodipine as prescribed.  ECG from 01/2017 reviewed.  OA/Fibromyalgia/Ankylosing Spondylitis: Managed with Hydrocodone.  She is not following with pain management.  She is due for a CSA today.  Anxiety, Panic Attacks and Depression: Worse since having COVID.  She is taking Xanax as needed but reports she does  not like the way it makes her feel.  She is not currently seeing a therapist.  She denies SI/HI.  IBS: She reports alternating constipation and diarrhea.  She tries to control this with diet.  There is no colonoscopy on file.  HLD: Her last LDL was 105, triglycerides 315, 11/2019. She is not taking any cholesterol lowering medication at this time. She tries to consume a low fat diet.  Flu: never Tetanus: 08/2011 COVID: Never Shingrix: Never Pap smear: 02/2012, hysterectomy Mammogram: 02/2012 Bone density: 02/2012 Colon screening: Cologuard, 09/2019 Vision screen: annually Dentist: as needed  Diet: She does eat meat. She consumes fruits and veggies. She tries to avoid fried foods. She drinks mocha, water. Exercise: Gardening  Review of Systems     Past Medical History:  Diagnosis Date   Ankylosing spondylitis (HCC)    Degenerative disc disease    Fibromyalgia    Hypertension    IBS (irritable bowel syndrome)     Current Outpatient Medications  Medication Sig Dispense Refill   ALPRAZolam (XANAX) 1 MG tablet TAKE 1/2 -1 TABLETS (0.5-1 MG TOTAL) BY MOUTH AT BEDTIME AS NEEDED FOR ANXIETY. 30 tablet 0   amLODipine (NORVASC) 5 MG tablet TAKE 1 TABLET BY MOUTH EVERY DAY 90 tablet 0   cholecalciferol (VITAMIN D3) 25 MCG (1000 UNIT) tablet Take 1,000 Units by mouth daily.     HYDROcodone-acetaminophen  (NORCO) 10-325 MG tablet Take 1 tablet by mouth daily as needed. 30 tablet 0   lisinopril-hydrochlorothiazide (ZESTORETIC) 20-12.5 MG tablet TAKE 2 TABLETS BY MOUTH EVERY DAY 180 tablet 0   No current facility-administered medications for this visit.    Allergies  Allergen Reactions   Sulfa Antibiotics Hives and Swelling   Ibuprofen     GI IRRITATION    Family History  Problem Relation Age of Onset   Arthritis Mother    Arthritis Father    Hyperlipidemia Father    Stroke Father    Hypertension Father    Diabetes Father    Arthritis Maternal Grandmother    Arthritis Maternal Grandfather    Arthritis Paternal Grandmother    Heart disease Paternal Grandmother    Stroke Paternal Grandmother    Hypertension Paternal Grandmother    Diabetes Paternal Grandmother    Arthritis Paternal Grandfather    Hypertension Paternal Grandfather    Diabetes Paternal Grandfather     Social History   Socioeconomic History   Marital status: Married    Spouse name: Not on file   Number of children: Not on file   Years of education: Not on file   Highest education level: Not on file  Occupational History   Not on file  Tobacco Use   Smoking status: Never   Smokeless tobacco: Never  Substance and Sexual Activity   Alcohol use: Yes    Comment: rare   Drug use: Yes  Types: Marijuana    Comment: Cannabis oil   Sexual activity: Yes  Other Topics Concern   Not on file  Social History Narrative   Not on file   Social Determinants of Health   Financial Resource Strain: Not on file  Food Insecurity: Not on file  Transportation Needs: Not on file  Physical Activity: Not on file  Stress: Not on file  Social Connections: Not on file  Intimate Partner Violence: Not on file     Constitutional: Patient reports chronic fatigue.  Denies fever, malaise, headache or abrupt weight changes.  HEENT: Denies eye pain, eye redness, ear pain, ringing in the ears, wax buildup, runny nose, nasal  congestion, bloody nose, or sore throat. Respiratory: Denies difficulty breathing, shortness of breath, cough or sputum production.   Cardiovascular: Denies chest pain, chest tightness, palpitations or swelling in the hands or feet.  Gastrointestinal: Denies abdominal pain, bloating, constipation, diarrhea or blood in the stool.  GU: Denies urgency, frequency, pain with urination, burning sensation, blood in urine, odor or discharge. Musculoskeletal: Patient reports chronic muscle and joint pain.  Denies decrease in range of motion, difficulty with gait, or joint swelling.  Skin: Denies redness, rashes, lesions or ulcercations.  Neurological: Denies dizziness, difficulty with memory, difficulty with speech or problems with balance and coordination.  Psych: Patient has a history of anxiety and depression.  Denies SI/HI.  No other specific complaints in a complete review of systems (except as listed in HPI above).  Objective:   Physical Exam   BP 122/74 (BP Location: Right Arm, Patient Position: Sitting, Cuff Size: Large)   Pulse 69   Temp (!) 97.5 F (36.4 C) (Temporal)   Ht 5\' 7"  (1.702 m)   Wt 200 lb (90.7 kg)   SpO2 99%   BMI 31.32 kg/m   Wt Readings from Last 3 Encounters:  09/04/20 199 lb (90.3 kg)  07/27/20 198 lb (89.8 kg)  06/28/20 200 lb (90.7 kg)    General: Appears her stated age, obese, in NAD. Skin: Warm, dry and intact.  HEENT: Head: normal shape and size; Eyes: sclera white and EOMs intact;  Neck:  Neck supple, trachea midline. No masses, lumps or thyromegaly present.  Cardiovascular: Normal rate and rhythm. S1,S2 noted.  No murmur, rubs or gallops noted. No JVD or BLE edema. No carotid bruits noted. Pulmonary/Chest: Normal effort and positive vesicular breath sounds. No respiratory distress. No wheezes, rales or ronchi noted.  Abdomen: Soft and nontender. Normal bowel sounds. No distention or masses noted. Liver, spleen and kidneys non palpable. Musculoskeletal:  Strength 4/5 BUE/BLE. No difficulty with gait.  Neurological: Alert and oriented. Cranial nerves II-XII grossly intact. Coordination normal.  Psychiatric: Mood and affect normal. Behavior is normal. Judgment and thought content normal.   BMET    Component Value Date/Time   NA 139 07/26/2020 1141   K 4.4 07/26/2020 1141   CL 102 07/26/2020 1141   CO2 29 07/26/2020 1141   GLUCOSE 97 07/26/2020 1141   BUN 19 07/26/2020 1141   CREATININE 0.71 07/26/2020 1141   CALCIUM 9.8 07/26/2020 1141   GFRNONAA >90 09/09/2011 2110   GFRAA >90 09/09/2011 2110    Lipid Panel     Component Value Date/Time   CHOL 181 12/13/2019 1254   TRIG 315.0 (H) 12/13/2019 1254   HDL 34.30 (L) 12/13/2019 1254   CHOLHDL 5 12/13/2019 1254   VLDL 63.0 (H) 12/13/2019 1254   LDLCALC 102 (H) 04/02/2016 1403    CBC  Component Value Date/Time   WBC 9.2 07/26/2020 1141   RBC 4.45 07/26/2020 1141   HGB 13.8 07/26/2020 1141   HCT 40.1 07/26/2020 1141   PLT 290.0 07/26/2020 1141   MCV 90.0 07/26/2020 1141   MCH 31.1 09/09/2011 2110   MCHC 34.4 07/26/2020 1141   RDW 13.2 07/26/2020 1141   LYMPHSABS 2.7 09/09/2011 2110   MONOABS 0.9 09/09/2011 2110   EOSABS 1.0 (H) 09/09/2011 2110   BASOSABS 0.0 09/09/2011 2110    Hgb A1C Lab Results  Component Value Date   HGBA1C 5.6 12/13/2019           Assessment & Plan:   Preventative Health Maintenance:  She declines flu shot Tetanus due 2023 She declines COVID-vaccine She declines Shingrix vaccine at this time She declines Pap smear today She declines mammogram today She declines bone density today Cologuard UTD Encouraged her to consume a balanced diet and exercise regimen Advised her to see an eye doctor and dentist annually We will check CBC, c-Met, TSH, lipid and A1c today  RTC in 6 months for follow-up of chronic conditions Webb Silversmith, NP This visit occurred during the SARS-CoV-2 public health emergency.  Safety protocols were in place,  including screening questions prior to the visit, additional usage of staff PPE, and extensive cleaning of exam room while observing appropriate contact time as indicated for disinfecting solutions.

## 2020-12-11 NOTE — Telephone Encounter (Signed)
[facility] and [facility] will [facility]. [facility] [address]. Patient is needs this done now and please call once has been done.

## 2020-12-11 NOTE — Assessment & Plan Note (Signed)
Controlled on Lisinopril HCT Reinforced DASH diet and exercise for weight loss CMET today 

## 2020-12-11 NOTE — Assessment & Plan Note (Signed)
Continue Hydroxcodone, will increase to BID Encouraged weight loss and regular physical activity

## 2020-12-11 NOTE — Assessment & Plan Note (Signed)
Controlled with diet Will monitor

## 2020-12-11 NOTE — Patient Instructions (Signed)

## 2020-12-11 NOTE — Assessment & Plan Note (Signed)
Encouraged diet and exercise for weight loss ?

## 2020-12-11 NOTE — Telephone Encounter (Signed)
Note not needed 

## 2020-12-12 LAB — LIPID PANEL
Cholesterol: 184 mg/dL (ref <200)
HDL: 43 mg/dL — ABNORMAL LOW (ref >=50)
LDL Cholesterol (Calc): 111 mg/dL (calc) — ABNORMAL HIGH
Non-HDL Cholesterol (Calc): 141 mg/dL (calc) — ABNORMAL HIGH (ref <130)
Total CHOL/HDL Ratio: 4.3 (calc) (ref <5.0)
Triglycerides: 186 mg/dL — ABNORMAL HIGH (ref <150)

## 2020-12-12 LAB — COMPLETE METABOLIC PANEL WITH GFR
AG Ratio: 1.8 (calc) (ref 1.0–2.5)
ALT: 16 U/L (ref 6–29)
AST: 18 U/L (ref 10–35)
Albumin: 4.4 g/dL (ref 3.6–5.1)
Alkaline phosphatase (APISO): 82 U/L (ref 37–153)
BUN: 14 mg/dL (ref 7–25)
CO2: 26 mmol/L (ref 20–32)
Calcium: 9.8 mg/dL (ref 8.6–10.4)
Chloride: 105 mmol/L (ref 98–110)
Creat: 0.67 mg/dL (ref 0.50–1.05)
GFR, Est African American: 112 mL/min/{1.73_m2} (ref >=60)
GFR, Est Non African American: 96 mL/min/{1.73_m2} (ref >=60)
Globulin: 2.5 g/dL (calc) (ref 1.9–3.7)
Glucose, Bld: 76 mg/dL (ref 65–99)
Potassium: 4 mmol/L (ref 3.5–5.3)
Sodium: 139 mmol/L (ref 135–146)
Total Bilirubin: 0.5 mg/dL (ref 0.2–1.2)
Total Protein: 6.9 g/dL (ref 6.1–8.1)

## 2020-12-12 LAB — CBC
HCT: 41.9 % (ref 35.0–45.0)
Hemoglobin: 13.8 g/dL (ref 11.7–15.5)
MCH: 30.1 pg (ref 27.0–33.0)
MCHC: 32.9 g/dL (ref 32.0–36.0)
MCV: 91.5 fL (ref 80.0–100.0)
MPV: 10.5 fL (ref 7.5–12.5)
Platelets: 290 10*3/uL (ref 140–400)
RBC: 4.58 10*6/uL (ref 3.80–5.10)
RDW: 12.6 % (ref 11.0–15.0)
WBC: 9.2 10*3/uL (ref 3.8–10.8)

## 2020-12-12 LAB — HEMOGLOBIN A1C
Hgb A1c MFr Bld: 5.3 % of total Hgb (ref <5.7)
Mean Plasma Glucose: 105 mg/dL
eAG (mmol/L): 5.8 mmol/L

## 2020-12-12 LAB — TSH: TSH: 0.78 mIU/L (ref 0.40–4.50)

## 2020-12-17 ENCOUNTER — Other Ambulatory Visit: Payer: Self-pay | Admitting: Internal Medicine

## 2020-12-17 NOTE — Telephone Encounter (Signed)
Requested Prescriptions  Pending Prescriptions Disp Refills  . amLODipine (NORVASC) 5 MG tablet [Pharmacy Med Name: AMLODIPINE BESYLATE 5 MG TAB] 90 tablet 1    Sig: TAKE 1 TABLET BY MOUTH EVERY DAY     Cardiovascular:  Calcium Channel Blockers Passed - 12/17/2020  7:53 AM      Passed - Last BP in normal range    BP Readings from Last 1 Encounters:  12/11/20 122/74         Passed - Valid encounter within last 6 months    Recent Outpatient Visits          6 days ago Encounter for general adult medical examination with abnormal findings   [facility] [place], Regina W, NP             . lisinopril-hydrochlorothiazide (ZESTORETIC) 20-12.5 MG tablet [Pharmacy Med Name: LISINOPRIL-HCTZ 20-12.5 MG TAB] 180 tablet 0    Sig: TAKE 2 TABLETS BY MOUTH EVERY DAY     Cardiovascular:  ACEI + Diuretic Combos Passed - 12/17/2020  7:53 AM      Passed - Na in normal range and within 180 days    Sodium  Date Value Ref Range Status  12/11/2020 139 135 - 146 mmol/L Final         Passed - K in normal range and within 180 days    Potassium  Date Value Ref Range Status  12/11/2020 4.0 3.5 - 5.3 mmol/L Final         Passed - Cr in normal range and within 180 days    Creat  Date Value Ref Range Status  12/11/2020 0.67 0.50 - 1.05 mg/dL Final    Comment:    For patients >66 years of age, the reference limit for Creatinine is approximately 13% higher for people identified as African-American. .          Passed - Ca in normal range and within 180 days    Calcium  Date Value Ref Range Status  12/11/2020 9.8 8.6 - 10.4 mg/dL Final   Calcium, Ion  Date Value Ref Range Status  01/25/2008 1.10 (L)  Final         Passed - Patient is not pregnant      Passed - Last BP in normal range    BP Readings from Last 1 Encounters:  12/11/20 122/74         Passed - Valid encounter within last 6 months    Recent Outpatient Visits          6 days ago Encounter for general adult  medical examination with abnormal findings   [facility] [place], Coralie Keens, NP

## 2021-01-10 ENCOUNTER — Other Ambulatory Visit: Payer: Self-pay | Admitting: Internal Medicine

## 2021-01-10 MED ORDER — HYDROCODONE-ACETAMINOPHEN 10-325 MG PO TABS: 1.0000 | ORAL_TABLET | Freq: Two times a day (BID) | ORAL | 0 refills | Status: DC

## 2021-02-13 ENCOUNTER — Other Ambulatory Visit: Payer: Self-pay | Admitting: Internal Medicine

## 2021-02-13 MED ORDER — HYDROCODONE-ACETAMINOPHEN 10-325 MG PO TABS: 1.0000 | ORAL_TABLET | Freq: Two times a day (BID) | ORAL | 0 refills | Status: DC

## 2021-02-13 NOTE — Telephone Encounter (Signed)
Requested medication (s) are due for refill today: No  Requested medication (s) are on the active medication list: Yes  Last refill:  02/13/21  Future visit scheduled: No  Notes to clinic:  Unable to refill per protocol, cannot delegate. Already sent to pharmacy by PCP     Requested Prescriptions  Pending Prescriptions Disp Refills   HYDROcodone-acetaminophen (NORCO) 10-325 MG tablet 60 tablet 0    Sig: Take 1 tablet by mouth 2 (two) times daily.     Not Delegated - Analgesics:  Opioid Agonist Combinations Failed - 02/13/2021  6:29 PM      Failed - This refill cannot be delegated      Failed - Urine Drug Screen completed in last 360 days      Passed - Valid encounter within last 6 months    Recent Outpatient Visits           2 months ago Encounter for general adult medical examination with abnormal findings   [facility] [place], Coralie Keens, NP

## 2021-02-13 NOTE — Telephone Encounter (Signed)
Medication: HYDROcodone-acetaminophen (NORCO) 10-325 MG tablet Has the pt contacted their pharmacy? No  [id number] [place] [place] Please be advised refills may take up to 3 business days.  We ask that you follow up with your pharmacy.

## 2021-02-28 ENCOUNTER — Ambulatory Visit
Admission: RE | Admit: 2021-02-28 | Discharge: 2021-02-28 | Disposition: A | Discharge status: Home / Self Care | Payer: PPO | Source: Ambulatory Visit | Attending: Internal Medicine | Admitting: Internal Medicine

## 2021-02-28 ENCOUNTER — Encounter: Payer: Self-pay | Admitting: Internal Medicine

## 2021-02-28 ENCOUNTER — Ambulatory Visit
Admission: RE | Admit: 2021-02-28 | Discharge: 2021-02-28 | Disposition: A | Discharge status: Home / Self Care | Payer: PPO | Source: Home / Self Care | Attending: Internal Medicine | Admitting: Internal Medicine

## 2021-02-28 ENCOUNTER — Ambulatory Visit (INDEPENDENT_AMBULATORY_CARE_PROVIDER_SITE_OTHER): Payer: PPO | Admitting: Internal Medicine

## 2021-02-28 ENCOUNTER — Other Ambulatory Visit: Payer: Self-pay

## 2021-02-28 VITALS — BP 127/77 | HR 64 | Temp 98.0°F | Resp 17 | Ht 67.0 in | Wt 200.8 lb

## 2021-02-28 DIAGNOSIS — J00 Acute nasopharyngitis [common cold]: Secondary | ICD-10-CM

## 2021-02-28 DIAGNOSIS — M19012 Primary osteoarthritis, left shoulder: Secondary | ICD-10-CM | POA: Diagnosis not present

## 2021-02-28 DIAGNOSIS — G8929 Other chronic pain: Secondary | ICD-10-CM

## 2021-02-28 DIAGNOSIS — E6609 Other obesity due to excess calories: Secondary | ICD-10-CM | POA: Diagnosis not present

## 2021-02-28 DIAGNOSIS — Z6831 Body mass index (BMI) 31.0-31.9, adult: Secondary | ICD-10-CM | POA: Diagnosis not present

## 2021-02-28 DIAGNOSIS — K649 Unspecified hemorrhoids: Secondary | ICD-10-CM

## 2021-02-28 DIAGNOSIS — M25712 Osteophyte, left shoulder: Secondary | ICD-10-CM | POA: Diagnosis not present

## 2021-02-28 DIAGNOSIS — E785 Hyperlipidemia, unspecified: Secondary | ICD-10-CM | POA: Insufficient documentation

## 2021-02-28 DIAGNOSIS — F419 Anxiety disorder, unspecified: Secondary | ICD-10-CM | POA: Diagnosis not present

## 2021-02-28 DIAGNOSIS — F32A Depression, unspecified: Secondary | ICD-10-CM

## 2021-02-28 DIAGNOSIS — M25512 Pain in left shoulder: Secondary | ICD-10-CM | POA: Diagnosis not present

## 2021-02-28 DIAGNOSIS — K582 Mixed irritable bowel syndrome: Secondary | ICD-10-CM

## 2021-02-28 DIAGNOSIS — K921 Melena: Secondary | ICD-10-CM

## 2021-02-28 MED ORDER — AZITHROMYCIN 250 MG PO TABS: ORAL_TABLET | ORAL | 0 refills | Status: DC

## 2021-02-28 MED ORDER — HYDROCORTISONE ACETATE 25 MG RE SUPP: 25.0000 mg | Freq: Two times a day (BID) | RECTAL | 0 refills | Status: DC
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
## 2021-02-28 NOTE — Patient Instructions (Signed)
Shoulder Exercises Ask your health care provider which exercises are safe for you. Do exercises exactly as told by your health care provider and adjust them as directed. It is normal to feel mild stretching, pulling, tightness, or discomfort as you do these exercises. Stop right away if you feel sudden pain or your pain gets worse. Do not begin these exercises until told by your health care provider. Stretching exercises External rotation and abduction This exercise is sometimes called corner stretch. This exercise rotates your arm outward (external rotation) and moves your arm out from your body (abduction). Stand in a doorway with one of your feet slightly in front of the other. This is called a staggered stance. If you cannot reach your forearms to the door frame, stand facing a corner of a room. Choose one of the following positions as told by your health care provider: Place your hands and forearms on the door frame above your head. Place your hands and forearms on the door frame at the height of your head. Place your hands on the door frame at the height of your elbows. Slowly move your weight onto your front foot until you feel a stretch across your chest and in the front of your shoulders. Keep your head and chest upright and keep your abdominal muscles tight. Hold for __________ seconds. To release the stretch, shift your weight to your back foot. Repeat __________ times. Complete this exercise __________ times a day. Extension, standing Stand and hold a broomstick, a cane, or a similar object behind your back. Your hands should be a little wider than shoulder width apart. Your palms should face away from your back. Keeping your elbows straight and your shoulder muscles relaxed, move the stick away from your body until you feel a stretch in your shoulders (extension). Avoid shrugging your shoulders while you move the stick. Keep your shoulder blades tucked down toward the middle of your  back. Hold for __________ seconds. Slowly return to the starting position. Repeat __________ times. Complete this exercise __________ times a day. Range-of-motion exercises Pendulum  Stand near a wall or a surface that you can hold onto for balance. Bend at the waist and let your left / right arm hang straight down. Use your other arm to support you. Keep your back straight and do not lock your knees. Relax your left / right arm and shoulder muscles, and move your hips and your trunk so your left / right arm swings freely. Your arm should swing because of the motion of your body, not because you are using your arm or shoulder muscles. Keep moving your hips and trunk so your arm swings in the following directions, as told by your health care provider: Side to side. Forward and backward. In clockwise and counterclockwise circles. Continue each motion for __________ seconds, or for as long as told by your health care provider. Slowly return to the starting position. Repeat __________ times. Complete this exercise __________ times a day. Shoulder flexion, standing  Stand and hold a broomstick, a cane, or a similar object. Place your hands a little more than shoulder width apart on the object. Your left / right hand should be palm up, and your other hand should be palm down. Keep your elbow straight and your shoulder muscles relaxed. Push the stick up with your healthy arm to raise your left / right arm in front of your body, and then over your head until you feel a stretch in your shoulder (flexion). Avoid   shrugging your shoulder while you raise your arm. Keep your shoulder blade tucked down toward the middle of your back. Hold for __________ seconds. Slowly return to the starting position. Repeat __________ times. Complete this exercise __________ times a day. Shoulder abduction, standing Stand and hold a broomstick, a cane, or a similar object. Place your hands a little more than shoulder  width apart on the object. Your left / right hand should be palm up, and your other hand should be palm down. Keep your elbow straight and your shoulder muscles relaxed. Push the object across your body toward your left / right side. Raise your left / right arm to the side of your body (abduction) until you feel a stretch in your shoulder. Do not raise your arm above shoulder height unless your health care provider tells you to do that. If directed, raise your arm over your head. Avoid shrugging your shoulder while you raise your arm. Keep your shoulder blade tucked down toward the middle of your back. Hold for __________ seconds. Slowly return to the starting position. Repeat __________ times. Complete this exercise __________ times a day. Internal rotation  Place your left / right hand behind your back, palm up. Use your other hand to dangle an exercise band, a towel, or a similar object over your shoulder. Grasp the band with your left / right hand so you are holding on to both ends. Gently pull up on the band until you feel a stretch in the front of your left / right shoulder. The movement of your arm toward the center of your body is called internal rotation. Avoid shrugging your shoulder while you raise your arm. Keep your shoulder blade tucked down toward the middle of your back. Hold for __________ seconds. Release the stretch by letting go of the band and lowering your hands. Repeat __________ times. Complete this exercise __________ times a day. Strengthening exercises External rotation  Sit in a stable chair without armrests. Secure an exercise band to a stable object at elbow height on your left / right side. Place a soft object, such as a folded towel or a small pillow, between your left / right upper arm and your body to move your elbow about 4 inches (10 cm) away from your side. Hold the end of the exercise band so it is tight and there is no slack. Keeping your elbow pressed  against the soft object, slowly move your forearm out, away from your abdomen (external rotation). Keep your body steady so only your forearm moves. Hold for __________ seconds. Slowly return to the starting position. Repeat __________ times. Complete this exercise __________ times a day. Shoulder abduction  Sit in a stable chair without armrests, or stand up. Hold a __________ weight in your left / right hand, or hold an exercise band with both hands. Start with your arms straight down and your left / right palm facing in, toward your body. Slowly lift your left / right hand out to your side (abduction). Do not lift your hand above shoulder height unless your health care provider tells you that this is safe. Keep your arms straight. Avoid shrugging your shoulder while you do this movement. Keep your shoulder blade tucked down toward the middle of your back. Hold for __________ seconds. Slowly lower your arm, and return to the starting position. Repeat __________ times. Complete this exercise __________ times a day. Shoulder extension Sit in a stable chair without armrests, or stand up. Secure an exercise band   to a stable object in front of you so it is at shoulder height. Hold one end of the exercise band in each hand. Your palms should face each other. Straighten your elbows and lift your hands up to shoulder height. Step back, away from the secured end of the exercise band, until the band is tight and there is no slack. Squeeze your shoulder blades together as you pull your hands down to the sides of your thighs (extension). Stop when your hands are straight down by your sides. Do not let your hands go behind your body. Hold for __________ seconds. Slowly return to the starting position. Repeat __________ times. Complete this exercise __________ times a day. Shoulder row Sit in a stable chair without armrests, or stand up. Secure an exercise band to a stable object in front of you so it  is at waist height. Hold one end of the exercise band in each hand. Position your palms so that your thumbs are facing the ceiling (neutral position). Bend each of your elbows to a 90-degree angle (right angle) and keep your upper arms at your sides. Step back until the band is tight and there is no slack. Slowly pull your elbows back behind you. Hold for __________ seconds. Slowly return to the starting position. Repeat __________ times. Complete this exercise __________ times a day. Shoulder press-ups  Sit in a stable chair that has armrests. Sit upright, with your feet flat on the floor. Put your hands on the armrests so your elbows are bent and your fingers are pointing forward. Your hands should be about even with the sides of your body. Push down on the armrests and use your arms to lift yourself off the chair. Straighten your elbows and lift yourself up as much as you comfortably can. Move your shoulder blades down, and avoid letting your shoulders move up toward your ears. Keep your feet on the ground. As you get stronger, your feet should support less of your body weight as you lift yourself up. Hold for __________ seconds. Slowly lower yourself back into the chair. Repeat __________ times. Complete this exercise __________ times a day. Wall push-ups  Stand so you are facing a stable wall. Your feet should be about one arm-length away from the wall. Lean forward and place your palms on the wall at shoulder height. Keep your feet flat on the floor as you bend your elbows and lean forward toward the wall. Hold for __________ seconds. Straighten your elbows to push yourself back to the starting position. Repeat __________ times. Complete this exercise __________ times a day. This information is not intended to replace advice given to you by your health care provider. Make sure you discuss any questions you have with your healthcare provider. Document Revised: 09/25/2018 Document  Reviewed: 07/03/2018 Elsevier Patient Education  2022 [employer] Inc.  

## 2021-03-13 ENCOUNTER — Other Ambulatory Visit: Payer: Self-pay | Admitting: Internal Medicine

## 2021-03-13 MED ORDER — HYDROCODONE-ACETAMINOPHEN 10-325 MG PO TABS: 1.0000 | ORAL_TABLET | Freq: Two times a day (BID) | ORAL | 0 refills | Status: DC

## 2021-03-18 ENCOUNTER — Other Ambulatory Visit: Payer: Self-pay | Admitting: Internal Medicine

## 2021-04-05 DIAGNOSIS — L720 Epidermal cyst: Secondary | ICD-10-CM | POA: Diagnosis not present

## 2021-04-05 DIAGNOSIS — Z23 Encounter for immunization: Secondary | ICD-10-CM | POA: Diagnosis not present

## 2021-04-05 DIAGNOSIS — L578 Other skin changes due to chronic exposure to nonionizing radiation: Secondary | ICD-10-CM | POA: Diagnosis not present

## 2021-04-05 DIAGNOSIS — L821 Other seborrheic keratosis: Secondary | ICD-10-CM | POA: Diagnosis not present

## 2021-04-05 DIAGNOSIS — D229 Melanocytic nevi, unspecified: Secondary | ICD-10-CM | POA: Diagnosis not present

## 2021-04-05 DIAGNOSIS — D1801 Hemangioma of skin and subcutaneous tissue: Secondary | ICD-10-CM | POA: Diagnosis not present

## 2021-04-05 DIAGNOSIS — L814 Other melanin hyperpigmentation: Secondary | ICD-10-CM | POA: Diagnosis not present

## 2021-04-12 ENCOUNTER — Other Ambulatory Visit: Payer: Self-pay | Admitting: Internal Medicine

## 2021-04-12 MED ORDER — HYDROCODONE-ACETAMINOPHEN 10-325 MG PO TABS: 1.0000 | ORAL_TABLET | Freq: Two times a day (BID) | ORAL | 0 refills | Status: DC

## 2021-05-14 ENCOUNTER — Other Ambulatory Visit: Payer: Self-pay | Admitting: Internal Medicine

## 2021-05-14 MED ORDER — HYDROCODONE-ACETAMINOPHEN 10-325 MG PO TABS: 1.0000 | ORAL_TABLET | Freq: Two times a day (BID) | ORAL | 0 refills | Status: DC

## 2021-06-07 ENCOUNTER — Ambulatory Visit: Payer: Self-pay | Admitting: *Deleted

## 2021-06-07 MED ORDER — CLONAZEPAM 1 MG PO TABS: 0.5000 mg | ORAL_TABLET | Freq: Every day | ORAL | 0 refills | Status: DC | PRN

## 2021-06-07 NOTE — Addendum Note (Signed)
Addended by: Jearld Fenton on: 06/07/2021 03:23 PM   Modules accepted: Orders

## 2021-06-07 NOTE — Telephone Encounter (Signed)
Reason for Disposition  Patient sounds very upset or troubled to the triager  Answer Assessment - Initial Assessment Questions 1. CONCERN: "Did anything happen that prompted you to call today?"      Patient needs helps with her anxiety- flare of chronic medical problems and daughter is on drugs causing family stress with grandchild 2. ANXIETY SYMPTOMS: "Can you describe how you (your loved one; patient) have been feeling?" (e.g., tense, restless, panicky, anxious, keyed up, overwhelmed, sense of impending doom).      AS/fibromyalgia flare, having family problem 3. ONSET: "How long have you been feeling this way?" (e.g., hours, days, weeks)     This morning- just can't handle thing without medication 4. SEVERITY: "How would you rate the level of anxiety?" (e.g., 0 - 10; or mild, moderate, severe).     severe 5. FUNCTIONAL IMPAIRMENT: "How have these feelings affected your ability to do daily activities?" "Have you had more difficulty than usual doing your normal daily activities?" (e.g., getting better, same, worse; self-care, school, work, interactions)     Patient is having flare of chronic medical problems due to weather 6. HISTORY: "Have you felt this way before?" "Have you ever been diagnosed with an anxiety problem in the past?" (e.g., generalized anxiety disorder, panic attacks, PTSD). If Yes, ask: "How was this problem treated?" (e.g., medicines, counseling, etc.)     Yes- short term anxiety medication 7. RISK OF HARM - SUICIDAL IDEATION: "Do you ever have thoughts of hurting or killing yourself?" If Yes, ask:  "Do you have these feelings now?" "Do you have a plan on how you would do this?"     no 8. TREATMENT:  "What has been done so far to treat this anxiety?" (e.g., medicines, relaxation strategies). "What has helped?"     *No Answer* 9. TREATMENT - THERAPIST: "Do you have a counselor or therapist? Name?"     *No Answer* 10. POTENTIAL TRIGGERS: "Do you drink caffeinated beverages  (e.g., coffee, colas, teas), and how much daily?" "Do you drink alcohol or use any drugs?" "Have you started any new medicines recently?"     *No Answer* 10. PATIENT SUPPORT: "Who is with you now?" "Who do you live with?" "Do you have family or friends who you can talk to?"        *No Answer* 11. OTHER SYMPTOMS: "Do you have any other symptoms?" (e.g., feeling depressed, trouble concentrating, trouble sleeping, trouble breathing, palpitations or fast heartbeat, chest pain, sweating, nausea, or diarrhea)       *No Answer* 12. PREGNANCY: "Is there any chance you are pregnant?" "When was your last menstrual period?"       *No Answer*  Protocols used: Anxiety and Panic Attack-A-AH

## 2021-06-07 NOTE — Telephone Encounter (Signed)
°  Chief Complaint: anxiety- medication request Symptoms: flare of chronic illness with weather, family problems with daughter Frequency: breaking point today- request medication Pertinent Negatives: Patient denies suicide ideas Disposition: [] ED /[] Urgent Care (no appt availability in office) / [] Appointment(In office/virtual)/ []  [facility]/ [] Home Care/ [] Refused Recommended Disposition  Additional Notes: Call to office- patient states she has been given short term medication for anxiety before- requesting PCP Rx. Call sent for provider review,

## 2021-06-07 NOTE — Telephone Encounter (Signed)
Does she need a refill of Clonazepam?

## 2021-06-07 NOTE — Telephone Encounter (Signed)
Clonazepam sent to [facility] [place]

## 2021-06-14 ENCOUNTER — Other Ambulatory Visit: Payer: Self-pay | Admitting: Internal Medicine

## 2021-06-14 MED ORDER — HYDROCODONE-ACETAMINOPHEN 10-325 MG PO TABS: 1.0000 | ORAL_TABLET | Freq: Two times a day (BID) | ORAL | 0 refills | Status: DC

## 2021-06-17 ENCOUNTER — Other Ambulatory Visit: Payer: Self-pay | Admitting: Internal Medicine

## 2021-06-19 NOTE — Telephone Encounter (Signed)
Requested medication (s) are due for refill today: yes  Requested medication (s) are on the active medication list: yes  Last refill:  03/18/21 #180  Future visit scheduled: no  Notes to clinic:  overdue lab work   Requested Prescriptions  Pending Prescriptions Disp Refills   lisinopril-hydrochlorothiazide (ZESTORETIC) 20-12.5 MG tablet [Pharmacy Med Name: LISINOPRIL-HCTZ 20-12.5 MG TAB] 180 tablet 0    Sig: TAKE 2 TABLETS BY MOUTH EVERY DAY     Cardiovascular:  ACEI + Diuretic Combos Failed - 06/17/2021  1:51 AM      Failed - Na in normal range and within 180 days    Sodium  Date Value Ref Range Status  12/11/2020 139 135 - 146 mmol/L Final          Failed - K in normal range and within 180 days    Potassium  Date Value Ref Range Status  12/11/2020 4.0 3.5 - 5.3 mmol/L Final          Failed - Cr in normal range and within 180 days    Creat  Date Value Ref Range Status  12/11/2020 0.67 0.50 - 1.05 mg/dL Final    Comment:    For patients >59 years of age, the reference limit for Creatinine is approximately 13% higher for people identified as African-American. .           Failed - Ca in normal range and within 180 days    Calcium  Date Value Ref Range Status  12/11/2020 9.8 8.6 - 10.4 mg/dL Final   Calcium, Ion  Date Value Ref Range Status  01/25/2008 1.10 (L)  Final          Passed - Patient is not pregnant      Passed - Last BP in normal range    BP Readings from Last 1 Encounters:  02/28/21 127/77          Passed - Valid encounter within last 6 months    Recent Outpatient Visits           3 months ago Chronic left shoulder pain   [facility], Coralie Keens, NP   6 months ago Encounter for general adult medical examination with abnormal findings   [facility] [place], Coralie Keens, NP

## 2021-06-22 ENCOUNTER — Other Ambulatory Visit: Payer: Self-pay | Admitting: Internal Medicine

## 2021-06-22 NOTE — Telephone Encounter (Signed)
Requested Prescriptions  Pending Prescriptions Disp Refills   amLODipine (NORVASC) 5 MG tablet [Pharmacy Med Name: AMLODIPINE BESYLATE 5 MG TAB] 90 tablet 1    Sig: TAKE 1 TABLET BY MOUTH EVERY DAY     Cardiovascular:  Calcium Channel Blockers Passed - 06/22/2021  9:42 AM      Passed - Last BP in normal range    BP Readings from Last 1 Encounters:  02/28/21 127/77         Passed - Valid encounter within last 6 months    Recent Outpatient Visits          3 months ago Chronic left shoulder pain   [facility] [place], Coralie Keens, NP   6 months ago Encounter for general adult medical examination with abnormal findings   [facility] [place], Coralie Keens, NP

## 2021-07-16 ENCOUNTER — Other Ambulatory Visit: Payer: Self-pay | Admitting: Internal Medicine

## 2021-07-16 MED ORDER — HYDROCODONE-ACETAMINOPHEN 10-325 MG PO TABS: 1.0000 | ORAL_TABLET | Freq: Two times a day (BID) | ORAL | 0 refills | Status: DC

## 2021-08-14 ENCOUNTER — Other Ambulatory Visit: Payer: Self-pay | Admitting: Internal Medicine

## 2021-08-14 MED ORDER — HYDROCODONE-ACETAMINOPHEN 10-325 MG PO TABS: 1.0000 | ORAL_TABLET | Freq: Two times a day (BID) | ORAL | 0 refills | Status: DC

## 2021-08-20 DIAGNOSIS — H524 Presbyopia: Secondary | ICD-10-CM | POA: Diagnosis not present

## 2021-08-24 ENCOUNTER — Other Ambulatory Visit: Payer: Self-pay

## 2021-08-24 ENCOUNTER — Ambulatory Visit (INDEPENDENT_AMBULATORY_CARE_PROVIDER_SITE_OTHER): Payer: No Typology Code available for payment source | Admitting: Internal Medicine

## 2021-08-24 ENCOUNTER — Encounter: Payer: Self-pay | Admitting: Internal Medicine

## 2021-08-24 VITALS — BP 120/70 | HR 80 | Temp 97.5°F | Wt 206.0 lb

## 2021-08-24 DIAGNOSIS — E782 Mixed hyperlipidemia: Secondary | ICD-10-CM

## 2021-08-24 DIAGNOSIS — E6609 Other obesity due to excess calories: Secondary | ICD-10-CM

## 2021-08-24 DIAGNOSIS — M1711 Unilateral primary osteoarthritis, right knee: Secondary | ICD-10-CM

## 2021-08-24 DIAGNOSIS — I10 Essential (primary) hypertension: Secondary | ICD-10-CM | POA: Diagnosis not present

## 2021-08-24 DIAGNOSIS — K582 Mixed irritable bowel syndrome: Secondary | ICD-10-CM

## 2021-08-24 DIAGNOSIS — F32A Depression, unspecified: Secondary | ICD-10-CM

## 2021-08-24 DIAGNOSIS — Z6832 Body mass index (BMI) 32.0-32.9, adult: Secondary | ICD-10-CM

## 2021-08-24 DIAGNOSIS — M456 Ankylosing spondylitis lumbar region: Secondary | ICD-10-CM

## 2021-08-24 DIAGNOSIS — M797 Fibromyalgia: Secondary | ICD-10-CM | POA: Diagnosis not present

## 2021-08-24 DIAGNOSIS — F419 Anxiety disorder, unspecified: Secondary | ICD-10-CM

## 2021-08-24 DIAGNOSIS — G894 Chronic pain syndrome: Secondary | ICD-10-CM | POA: Diagnosis not present

## 2021-08-24 DIAGNOSIS — E559 Vitamin D deficiency, unspecified: Secondary | ICD-10-CM | POA: Diagnosis not present

## 2021-08-24 NOTE — Patient Instructions (Signed)
Ankylosing Spondylitis, Adult Ankylosing spondylitis (AS) is a long-term (chronic) condition that causes inflammation, often in the spine. Over time, the inflammation can make new bone form in the spine. This can result in the spinal joints fusing together (ankylosis) and loss of movement. In some people, inflammation may affect other areas of the body, such as the shoulders, hips, ribs, and small joints in the hands and feet. Sometimes the eyes are affected. Inflammation can also happen in the joints of the back and in the tissues that connect joints to bones (ligaments) or connect muscles to bones (tendons). As the disease gets worse, the joints that connect the spine to the pelvis (sacroiliac or SIjoints) are often affected. The condition can range from mild to severe. You may have more severe AS if you smoke. What are the causes? The exact cause of this condition is not known. It may be caused by abnormal genes that are passed down through families. The most common gene that has been linked to AS produces a protein called HLA-B27. Even if you have genes for AS, they may need to be triggered to become active. Infection may be one trigger. What increases the risk? You are more likely to develop this condition if: You have a family history of AS. You are between the ages of 17 and 45. You are female. You have frequent gastrointestinal infections. What are the signs or symptoms? The most common symptoms are pain and stiffness that get worse with rest and better with movement. Pain may be worse at night, and stiffness may be worse in the morning. Symptoms also depend on where the inflammation occurs: Inflammation of the SI joints causes pain in the lower back and buttocks. You may also feel pain in your hips. Inflammation in the upper spine, neck, and ribs causes pain in those areas. Rib inflammation may cause shortness of breath. Inflammation in the shoulder, fingers, knees, ankles, toes, or heels may  cause pain and stiffness in those areas. Inflammation in the eyes may cause eye pain, redness, or visual problems. Generalized inflammation may cause fever, loss of appetite, and tiredness (fatigue). How is this diagnosed? This condition may be diagnosed based on: Your symptoms and your medical and family history. A physical exam. Tests, such as: X-rays or an MRI to look for joint changes or inflammation. A blood test to check for the protein HLA-B27. You may be referred to a health care provider who specializes in diseases of the bones, muscles, and joints (rheumatologist). How is this treated? There is no cure for this condition, but treatment can reduce symptoms. Treatment options may include: Medicines, such as: NSAIDs, such as ibuprofen. These may be used first to reduce pain and inflammation. Steroid shots into inflamed joints to help reverse inflammation. Disease-modifying antirheumatic drugs (DMARDs). These may reduce symptoms and slow the progression of the disease. Biologic medicines. These may be used for moderate to severe forms of AS and when other treatments are not helping. These medicines are the most effective but may increase the risk for a serious infection. Physical therapy and physical activity to help keep muscles strong and keep joints from getting stiff. Surgery may be needed for severe joint damage, usually in the hip. This may require hip replacement surgery. Follow these instructions at home: Activity Return to your normal activities as told by your health care provider. Ask your health care provider what activities are safe for you. Try to get exercise every day. Exercise is very important when you   have AS. If you have learned physical therapy exercises, practice them as told by your physical therapist. Take care to avoid falls. When appropriate, use a cane or walker. Remove area rugs from your home. General instructions  Take over-the-counter and prescription  medicines only as told by your health care provider. Maintain good posture when standing and sitting. Maintain a healthy weight. Wear shoes that are supportive and well fitting. Do not use any products that contain nicotine or tobacco. These products include cigarettes, chewing tobacco, and vaping devices, such as e-cigarettes. These can make your condition worse. If you need help quitting, ask your health care provider. Keep all follow-up visits. This is important. Where to find more information Learn as much as you can about AS. You can find support and information from the Spondyl[employer] of [place]: www.spondylitis.org Contact a health care provider if: You are on a biologic medicine and you have a fever or any other signs of infection. You have side effects from any of your medicines. Your symptoms are not improving with medicine or are getting worse. Get help right away if: You have trouble breathing. You have a sudden change in vision. These symptoms may be an emergency. Get help right away. Call 911. Do not wait to see if the symptoms will go away. Do not drive yourself to the hospital. Summary Ankylosing spondylitis (AS) is a long-term condition that causes inflammation, often in the spine. AS may be caused by abnormal genes that can be passed down through families. The most common symptoms of AS are pain and stiffness that get worse with rest and better with movement. There is no cure for AS, but treatment can control symptoms and slow progression of the disease. NSAIDs, such as ibuprofen, may be the first treatment used. These help reduce pain and inflammation. This information is not intended to replace advice given to you by your health care provider. Make sure you discuss any questions you have with your health care provider. Document Revised: 01/08/2021 Document Reviewed: 01/08/2021 Elsevier Patient Education  2022 Elsevier Inc.  

## 2021-08-24 NOTE — Assessment & Plan Note (Signed)
We will check vitamin D, B12, ANA, ESR and CRP today ?Continue Hydrocodone as previously prescribed ?

## 2021-08-24 NOTE — Assessment & Plan Note (Signed)
Continue Hydrocodone as previously prescribed ?

## 2021-08-24 NOTE — Assessment & Plan Note (Signed)
Encourage regular stretching and core strengthening ?Continue Hydrocodone as previously prescribed ?

## 2021-08-24 NOTE — Assessment & Plan Note (Signed)
Persistent ?She takes Clonazepam as needed ?Support offered ?

## 2021-08-24 NOTE — Assessment & Plan Note (Signed)
C-Met and lipid profile today ?Encouraged her to consume a low-fat diet ?

## 2021-08-24 NOTE — Assessment & Plan Note (Signed)
Encourage diet and exercise for weight loss 

## 2021-08-24 NOTE — Assessment & Plan Note (Signed)
Controlled on Lisinopril HCT ?Reinforced DASH diet and exercise for weight loss ?C-Met ?

## 2021-08-24 NOTE — Progress Notes (Signed)
? ?Subjective:  ? ? Patient ID: Belinda Calderon, female    DOB: 11-21-1960, 61 y.o.   MRN: [id number] ? ?HPI ? ?Pt presents to the clinic today for follow up of chronic conditions. ? ?HTN: Her BP today is 120/70.  She is takingLlisinopril HCT and Amlodipine as prescribed.  ECG from 01/2017 reviewed. ? ?OA/Fibromyalgia/Ankylosing Spondylitis: She reports worsening back pain and swelling of left ankle.  She is taking Hydrocodone as prescribed.  She does not follow with pain management. ? ?Anxiety/Panic Attacks/Depression: Persistent secondary to family issues.  She is taking Clonazepam very rarely.  She does smoke THC to help her cope.  She is not currently seeing a therapist.  She denies SI/HI. ? ?IBS: She reports alternating constipation and diarrhea.  She controls this with diet.  There is no colonoscopy on file. ? ?HLD: Her last LDL was 111, triglycerides 186, 11/2020.  She is not taking any cholesterol-lowering medication at this time.  She tries to consume low-fat diet. ? ?Review of Systems ? ?   ?Past Medical History:  ?Diagnosis Date  ? Ankylosing spondylitis ([place])   ? Degenerative disc disease   ? Fibromyalgia   ? Hypertension   ? IBS (irritable bowel syndrome)   ? ? ?Current Outpatient Medications  ?Medication Sig Dispense Refill  ? amLODipine (NORVASC) 5 MG tablet TAKE 1 TABLET BY MOUTH EVERY DAY 90 tablet 1  ? azithromycin (ZITHROMAX) 250 MG tablet Take 2 tabs today, then 1 tab daily x 4 days 6 tablet 0  ? cholecalciferol (VITAMIN D3) 25 MCG (1000 UNIT) tablet Take 1,000 Units by mouth daily.    ? clonazePAM (KLONOPIN) 1 MG tablet Take 0.5 tablets (0.5 mg total) by mouth daily as needed for anxiety. 30 tablet 0  ? HYDROcodone-acetaminophen (NORCO) 10-325 MG tablet Take 1 tablet by mouth 2 (two) times daily. 60 tablet 0  ? hydrocortisone (ANUSOL-HC) 25 MG suppository Place 1 suppository (25 mg total) rectally 2 (two) times daily. 12 suppository 0  ? lisinopril-hydrochlorothiazide (ZESTORETIC) 20-12.5 MG  tablet TAKE 2 TABLETS BY MOUTH EVERY DAY 180 tablet 0  ? ?No current facility-administered medications for this visit.  ? ? ?Allergies  ?Allergen Reactions  ? Sulfa Antibiotics Hives and Swelling  ? Ibuprofen   ?  GI IRRITATION  ? ? ?Family History  ?Problem Relation Age of Onset  ? Diabetes Mother   ? Arthritis Mother   ? Arthritis Father   ? Hyperlipidemia Father   ? Stroke Father   ? Hypertension Father   ? Diabetes Father   ? Arthritis Maternal Grandmother   ? Arthritis Maternal Grandfather   ? Arthritis Paternal Grandmother   ? Heart disease Paternal Grandmother   ? Stroke Paternal Grandmother   ? Hypertension Paternal Grandmother   ? Diabetes Paternal Grandmother   ? Arthritis Paternal Grandfather   ? Hypertension Paternal Grandfather   ? Diabetes Paternal Grandfather   ? ? ?Social History  ? ?Socioeconomic History  ? Marital status: Married  ?  Spouse name: Not on file  ? Number of children: Not on file  ? Years of education: Not on file  ? Highest education level: Not on file  ?Occupational History  ? Not on file  ?Tobacco Use  ? Smoking status: Never  ? Smokeless tobacco: Never  ?Vaping Use  ? Vaping Use: Never used  ?Substance and Sexual Activity  ? Alcohol use: Yes  ?  Comment: rare  ? Drug use: Yes  ?  Types: Marijuana  ?  Comment: Cannabis oil  ? Sexual activity: Yes  ?Other Topics Concern  ? Not on file  ?Social History Narrative  ? Not on file  ? ?Social Determinants of Health  ? ?Financial Resource Strain: Not on file  ?Food Insecurity: Not on file  ?Transportation Needs: Not on file  ?Physical Activity: Not on file  ?Stress: Not on file  ?Social Connections: Not on file  ?Intimate Partner Violence: Not on file  ? ? ? ?Constitutional: Pt reports fatigue. Denies fever, malaise, headache or abrupt weight changes.  ?HEENT: Denies eye pain, eye redness, ear pain, ringing in the ears, wax buildup, runny nose, nasal congestion, bloody nose, or sore throat. ?Respiratory: Denies difficulty breathing,  shortness of breath, cough or sputum production.   ?Cardiovascular: Patient reports intermittent swelling in legs.  Denies chest pain, chest tightness, palpitations or swelling in the hands.  ?Gastrointestinal: Patient reports intermittent constipation and diarrhea.  Denies abdominal pain, bloating, or blood in the stool.  ?GU: Denies urgency, frequency, pain with urination, burning sensation, blood in urine, odor or discharge. ?Musculoskeletal: Patient reports chronic low back pain.  Denies decrease in range of motion, difficulty with gait, muscle pain or joint swelling.  ?Skin: Denies redness, rashes, lesions or ulcercations.  ?Neurological: Denies dizziness, difficulty with memory, difficulty with speech or problems with balance and coordination.  ?Psych: Patient has a history of anxiety and depression.  Denies SI/HI. ? ?No other specific complaints in a complete review of systems (except as listed in HPI above). ? ?Objective:  ? Physical Exam ? ?BP 120/70 (BP Location: Right Arm, Patient Position: Sitting, Cuff Size: Large)   Pulse 80   Temp (!) 97.5 ?F (36.4 ?C) (Temporal)   Wt 206 lb (93.4 kg)   SpO2 97%   BMI 32.26 kg/m?  ? ?Wt Readings from Last 3 Encounters:  ?02/28/21 200 lb 12.8 oz (91.1 kg)  ?12/11/20 200 lb (90.7 kg)  ?09/04/20 199 lb (90.3 kg)  ? ? ?General: Appears her stated age, obese, in NAD. ?Skin: Warm, dry and intact.  ?HEENT: Head: normal shape and size; Eyes: sclera white and EOMs intact;  ?Cardiovascular: Normal rate and rhythm. S1,S2 noted.  No murmur, rubs or gallops noted. No JVD or BLE edema. No carotid bruits noted. ?Pulmonary/Chest: Normal effort and positive vesicular breath sounds. No respiratory distress. No wheezes, rales or ronchi noted.  ?Abdomen: Normal bowel sounds.  ?Musculoskeletal: 1+ swelling noted of the left ankle.  Gait slow and steady without device. ?Neurological: Alert and oriented.  ?Psychiatric: Mood and affect normal.  Anxious and tearful.  Judgment and thought  content normal.  ? ? ?BMET ?   ?Component Value Date/Time  ? NA 139 12/11/2020 0844  ? K 4.0 12/11/2020 0844  ? CL 105 12/11/2020 0844  ? CO2 26 12/11/2020 0844  ? GLUCOSE 76 12/11/2020 0844  ? BUN 14 12/11/2020 0844  ? CREATININE 0.67 12/11/2020 0844  ? CALCIUM 9.8 12/11/2020 0844  ? GFRNONAA 96 12/11/2020 0844  ? GFRAA 112 12/11/2020 0844  ? ? ?Lipid Panel  ?   ?Component Value Date/Time  ? CHOL 184 12/11/2020 0844  ? TRIG 186 (H) 12/11/2020 0844  ? HDL 43 (L) 12/11/2020 0844  ? CHOLHDL 4.3 12/11/2020 0844  ? VLDL 63.0 (H) 12/13/2019 1254  ? [place] 111 (H) 12/11/2020 0844  ? ? ?CBC ?   ?Component Value Date/Time  ? WBC 9.2 12/11/2020 0844  ? RBC 4.58 12/11/2020 0844  ? HGB 13.8 12/11/2020 0844  ? HCT  41.9 12/11/2020 0844  ? PLT 290 12/11/2020 0844  ? MCV 91.5 12/11/2020 0844  ? MCH 30.1 12/11/2020 0844  ? MCHC 32.9 12/11/2020 0844  ? RDW 12.6 12/11/2020 0844  ? LYMPHSABS 2.7 09/09/2011 2110  ? MONOABS 0.9 09/09/2011 2110  ? EOSABS 1.0 (H) 09/09/2011 2110  ? BASOSABS 0.0 09/09/2011 2110  ? ? ?Hgb A1C ?Lab Results  ?Component Value Date  ? HGBA1C 5.3 12/11/2020  ? ? ? ? ? ? ?   ?Assessment & Plan:  ? ? ? ?Webb Silversmith, NP ?This visit occurred during the SARS-CoV-2 public health emergency.  Safety protocols were in place, including screening questions prior to the visit, additional usage of staff PPE, and extensive cleaning of exam room while observing appropriate contact time as indicated for disinfecting solutions.  ? ?

## 2021-08-24 NOTE — Assessment & Plan Note (Signed)
We will check ANA, ESR and CRP today ?Continue Hydrocodone as previously prescribed ?

## 2021-08-24 NOTE — Assessment & Plan Note (Signed)
Encouraged high-fiber diet and adequate water intake ?We will check TSH ?

## 2021-08-26 LAB — COMPLETE METABOLIC PANEL WITH GFR
AG Ratio: 1.8 (calc) (ref 1.0–2.5)
ALT: 16 U/L (ref 6–29)
AST: 17 U/L (ref 10–35)
Albumin: 4.4 g/dL (ref 3.6–5.1)
Alkaline phosphatase (APISO): 78 U/L (ref 37–153)
BUN: 18 mg/dL (ref 7–25)
CO2: 27 mmol/L (ref 20–32)
Calcium: 9.4 mg/dL (ref 8.6–10.4)
Chloride: 103 mmol/L (ref 98–110)
Creat: 0.63 mg/dL (ref 0.50–1.05)
Globulin: 2.5 g/dL (calc) (ref 1.9–3.7)
Glucose, Bld: 120 mg/dL (ref 65–139)
Potassium: 3.4 mmol/L — ABNORMAL LOW (ref 3.5–5.3)
Sodium: 138 mmol/L (ref 135–146)
Total Bilirubin: 0.4 mg/dL (ref 0.2–1.2)
Total Protein: 6.9 g/dL (ref 6.1–8.1)
eGFR: 101 mL/min/{1.73_m2} (ref >=60)

## 2021-08-26 LAB — CBC
HCT: 39.2 % (ref 35.0–45.0)
Hemoglobin: 13.4 g/dL (ref 11.7–15.5)
MCH: 30.8 pg (ref 27.0–33.0)
MCHC: 34.2 g/dL (ref 32.0–36.0)
MCV: 90.1 fL (ref 80.0–100.0)
MPV: 11.3 fL (ref 7.5–12.5)
Platelets: 278 10*3/uL (ref 140–400)
RBC: 4.35 10*6/uL (ref 3.80–5.10)
RDW: 12.7 % (ref 11.0–15.0)
WBC: 10.3 10*3/uL (ref 3.8–10.8)

## 2021-08-26 LAB — LIPID PANEL
Cholesterol: 173 mg/dL (ref <200)
HDL: 31 mg/dL — ABNORMAL LOW (ref >=50)
LDL Cholesterol (Calc): 102 mg/dL (calc) — ABNORMAL HIGH
Non-HDL Cholesterol (Calc): 142 mg/dL (calc) — ABNORMAL HIGH (ref <130)
Total CHOL/HDL Ratio: 5.6 (calc) — ABNORMAL HIGH (ref <5.0)
Triglycerides: 299 mg/dL — ABNORMAL HIGH (ref <150)

## 2021-08-26 LAB — C-REACTIVE PROTEIN: CRP: 1.9 mg/L (ref <8.0)

## 2021-08-26 LAB — SEDIMENTATION RATE: Sed Rate: 6 mm/h (ref 0–30)

## 2021-08-26 LAB — VITAMIN D 25 HYDROXY (VIT D DEFICIENCY, FRACTURES): Vit D, 25-Hydroxy: 47 ng/mL (ref 30–100)

## 2021-08-26 LAB — TSH: TSH: 0.76 mIU/L (ref 0.40–4.50)

## 2021-08-26 LAB — ANA: Anti Nuclear Antibody (ANA): NEGATIVE

## 2021-08-29 ENCOUNTER — Telehealth: Payer: Self-pay

## 2021-08-29 NOTE — Telephone Encounter (Signed)
Left message for patient to call back and schedule the Medicare Annual Wellness Visit (AWV) virtually, telephone or face to face. I spoke with the patient husband and he asked me to try back in about an hour.  ?  ?([phone] ?Donnie Mesa, CMA  ?

## 2021-09-03 DIAGNOSIS — H52209 Unspecified astigmatism, unspecified eye: Secondary | ICD-10-CM | POA: Diagnosis not present

## 2021-09-03 DIAGNOSIS — H5213 Myopia, bilateral: Secondary | ICD-10-CM | POA: Diagnosis not present

## 2021-09-03 DIAGNOSIS — H524 Presbyopia: Secondary | ICD-10-CM | POA: Diagnosis not present

## 2021-09-05 NOTE — Progress Notes (Deleted)
ALL RISK PREVENTION PERTAINING TO THE HOME: ? ?Any stairs in or around the home? {YES/NO:21197} ?If so, are there any without handrails? {YES/NO:21197} ?Home free of loose throw rugs in walkways, pet beds, electrical cords, etc? {YES/NO:21197} ?Adequate lighting in your home to reduce risk of falls? {YES/NO:21197} ? ?ASSISTIVE DEVICES UTILIZED TO PREVENT FALLS: ? ?Life alert? {YES/NO:21197} ?Use of a cane, walker or w/c? {YES/NO:21197} ?Grab bars in the bathroom? {YES/NO:21197} ?Shower chair or bench in shower? {YES/NO:21197} ?Elevated toilet seat or a handicapped toilet? {YES/NO:21197} ? ? ?Mammogram: ? ?

## 2021-09-12 ENCOUNTER — Other Ambulatory Visit: Payer: Self-pay | Admitting: Internal Medicine

## 2021-09-12 MED ORDER — HYDROCODONE-ACETAMINOPHEN 10-325 MG PO TABS: 1.0000 | ORAL_TABLET | Freq: Two times a day (BID) | ORAL | 0 refills | Status: DC

## 2021-10-02 ENCOUNTER — Encounter: Payer: Self-pay | Admitting: Internal Medicine

## 2021-10-02 ENCOUNTER — Ambulatory Visit (INDEPENDENT_AMBULATORY_CARE_PROVIDER_SITE_OTHER): Payer: No Typology Code available for payment source | Admitting: Internal Medicine

## 2021-10-02 DIAGNOSIS — F419 Anxiety disorder, unspecified: Secondary | ICD-10-CM

## 2021-10-02 DIAGNOSIS — G894 Chronic pain syndrome: Secondary | ICD-10-CM | POA: Diagnosis not present

## 2021-10-02 DIAGNOSIS — M1711 Unilateral primary osteoarthritis, right knee: Secondary | ICD-10-CM

## 2021-10-02 DIAGNOSIS — F32A Depression, unspecified: Secondary | ICD-10-CM | POA: Diagnosis not present

## 2021-10-02 DIAGNOSIS — M456 Ankylosing spondylitis lumbar region: Secondary | ICD-10-CM

## 2021-10-02 DIAGNOSIS — M797 Fibromyalgia: Secondary | ICD-10-CM

## 2021-10-02 DIAGNOSIS — K582 Mixed irritable bowel syndrome: Secondary | ICD-10-CM

## 2021-10-02 MED ORDER — LISINOPRIL-HYDROCHLOROTHIAZIDE 20-12.5 MG PO TABS: 2.0000 | ORAL_TABLET | Freq: Every day | ORAL | 0 refills | Status: DC

## 2021-10-02 MED ORDER — PANTOPRAZOLE SODIUM 40 MG PO TBEC: 40.0000 mg | DELAYED_RELEASE_TABLET | Freq: Every day | ORAL | 0 refills | Status: DC

## 2021-10-02 MED ORDER — CELECOXIB 50 MG PO CAPS: 50.0000 mg | ORAL_CAPSULE | Freq: Two times a day (BID) | ORAL | 2 refills | Status: DC

## 2021-10-02 NOTE — Patient Instructions (Signed)

## 2021-10-02 NOTE — Assessment & Plan Note (Signed)
We will start Pantoprazole 40 mg daily for gastroprotection ?

## 2021-10-02 NOTE — Assessment & Plan Note (Signed)
Deteriorated ?Discussed starting Duloxetine but she would like to hold off at this time ?Continue Clonazepam as needed ?Support offered ?

## 2021-10-02 NOTE — Assessment & Plan Note (Addendum)
Continue Hydrocodone as currently scheduled ?We will add Celebrex 50 mg 2 times daily ?Consider Duloxetine if symptoms persist or worsen ?

## 2021-10-02 NOTE — Assessment & Plan Note (Signed)
Continue Hydrocodone as currently scheduled ?We will add Celebrex 50 mg 2 times daily ?Consider Duloxetine if symptoms persist or worsen ?

## 2021-10-02 NOTE — Progress Notes (Signed)
? ?Subjective:  ? ? Patient ID: Belinda Calderon, female    DOB: 02/23/1961, 61 y.o.   MRN: [id number] ? ?HPI ? ?Patient presents to clinic today for follow-up of anxiety, depression and chronic pain secondary to ankylosing spondylitis.  She has a lot of family situational stuff going on right now.  She takes Clonazepam very rarely.  She has tried Buspar, duloxetine and Sertraline in the past.  She reports her anxiety and depression is causing lack of motivation, she has difficulty getting out of the bed and wanting to sleep all the time.  She is avoiding people and places due to triggers of her anxiety and depression.  She is not currently seeing a therapist.  She denies SI/HI. ? ?As far as her chronic pain, she was seen 3/10 for the same.  Inflammatory markers were negative.  She takes Hydrocodone 2 times daily.  She has been on gabapentin in the past with minimal relief of symptoms.  She reports her pain keeps her from doing things that she enjoys.  She is unable to take OTC medications because it makes her IBS flare, especially with abdominal bloating. ? ?Review of Systems ? ?   ?Past Medical History:  ?Diagnosis Date  ? Ankylosing spondylitis ([place])   ? Degenerative disc disease   ? Fibromyalgia   ? Hypertension   ? IBS (irritable bowel syndrome)   ? ? ?Current Outpatient Medications  ?Medication Sig Dispense Refill  ? amLODipine (NORVASC) 5 MG tablet TAKE 1 TABLET BY MOUTH EVERY DAY 90 tablet 1  ? cholecalciferol (VITAMIN D3) 25 MCG (1000 UNIT) tablet Take 1,000 Units by mouth daily.    ? clonazePAM (KLONOPIN) 1 MG tablet Take 0.5 tablets (0.5 mg total) by mouth daily as needed for anxiety. 30 tablet 0  ? HYDROcodone-acetaminophen (NORCO) 10-325 MG tablet Take 1 tablet by mouth 2 (two) times daily. 60 tablet 0  ? lisinopril-hydrochlorothiazide (ZESTORETIC) 20-12.5 MG tablet TAKE 2 TABLETS BY MOUTH EVERY DAY 180 tablet 0  ? ?No current facility-administered medications for this visit.  ? ? ?Allergies  ?Allergen  Reactions  ? Sulfa Antibiotics Hives and Swelling  ? Ibuprofen   ?  GI IRRITATION  ? ? ?Family History  ?Problem Relation Age of Onset  ? Diabetes Mother   ? Arthritis Mother   ? Arthritis Father   ? Hyperlipidemia Father   ? Stroke Father   ? Hypertension Father   ? Diabetes Father   ? Arthritis Maternal Grandmother   ? Arthritis Maternal Grandfather   ? Arthritis Paternal Grandmother   ? Heart disease Paternal Grandmother   ? Stroke Paternal Grandmother   ? Hypertension Paternal Grandmother   ? Diabetes Paternal Grandmother   ? Arthritis Paternal Grandfather   ? Hypertension Paternal Grandfather   ? Diabetes Paternal Grandfather   ? ? ?Social History  ? ?Socioeconomic History  ? Marital status: Married  ?  Spouse name: Not on file  ? Number of children: Not on file  ? Years of education: Not on file  ? Highest education level: Not on file  ?Occupational History  ? Not on file  ?Tobacco Use  ? Smoking status: Never  ? Smokeless tobacco: Never  ?Vaping Use  ? Vaping Use: Never used  ?Substance and Sexual Activity  ? Alcohol use: Yes  ?  Comment: rare  ? Drug use: Yes  ?  Types: Marijuana  ?  Comment: Cannabis oil  ? Sexual activity: Yes  ?Other Topics Concern  ?  Not on file  ?Social History Narrative  ? Not on file  ? ?Social Determinants of Health  ? ?Financial Resource Strain: Not on file  ?Food Insecurity: Not on file  ?Transportation Needs: Not on file  ?Physical Activity: Not on file  ?Stress: Not on file  ?Social Connections: Not on file  ?Intimate Partner Violence: Not on file  ? ? ? ?Constitutional: Denies fever, malaise, fatigue, headache or abrupt weight changes.  ?Respiratory: Denies difficulty breathing, shortness of breath, cough or sputum production.   ?Cardiovascular: Denies chest pain, chest tightness, palpitations or swelling in the hands or feet.  ?Gastrointestinal: Patient reports abdominal bloating.  Denies abdominal pain, constipation, diarrhea or blood in the stool.  ?Musculoskeletal: Patient  reports chronic joint and muscle pain.  Denies decrease in range of motion, difficulty with gait, or joint swelling.  ?Skin: Denies redness, rashes, lesions or ulcercations.  ?Neurological: Patient reports difficulty with memory.  Denies dizziness, difficulty with speech or problems with balance and coordination.  ?Psych: Patient has a history of anxiety and depression.  Denies SI/HI. ? ?No other specific complaints in a complete review of systems (except as listed in HPI above). ? ?Objective:  ? Physical Exam ? ?BP 120/74 (BP Location: Right Arm, Patient Position: Sitting, Cuff Size: Large)   Pulse 74   Temp (!) 97.5 ?F (36.4 ?C) (Temporal)   Wt 203 lb (92.1 kg)   SpO2 97%   BMI 31.79 kg/m?  ? ?Wt Readings from Last 3 Encounters:  ?08/24/21 206 lb (93.4 kg)  ?02/28/21 200 lb 12.8 oz (91.1 kg)  ?12/11/20 200 lb (90.7 kg)  ? ? ?General: Appears her stated age, obese, in NAD. ?Cardiovascular: Normal rate. ?Pulmonary/Chest: Normal effort. ?Musculoskeletal: Gait slow and steady without device. ?Neurological: Alert and oriented.  ?Psychiatric: Tearful. ? ?BMET ?   ?Component Value Date/Time  ? NA 138 08/24/2021 1456  ? K 3.4 (L) 08/24/2021 1456  ? CL 103 08/24/2021 1456  ? CO2 27 08/24/2021 1456  ? GLUCOSE 120 08/24/2021 1456  ? BUN 18 08/24/2021 1456  ? CREATININE 0.63 08/24/2021 1456  ? CALCIUM 9.4 08/24/2021 1456  ? GFRNONAA 96 12/11/2020 0844  ? GFRAA 112 12/11/2020 0844  ? ? ?Lipid Panel  ?   ?Component Value Date/Time  ? CHOL 173 08/24/2021 1456  ? TRIG 299 (H) 08/24/2021 1456  ? HDL 31 (L) 08/24/2021 1456  ? CHOLHDL 5.6 (H) 08/24/2021 1456  ? VLDL 63.0 (H) 12/13/2019 1254  ? LDLCALC 102 (H) 08/24/2021 1456  ? ? ?CBC ?   ?Component Value Date/Time  ? WBC 10.3 08/24/2021 1456  ? RBC 4.35 08/24/2021 1456  ? HGB 13.4 08/24/2021 1456  ? HCT 39.2 08/24/2021 1456  ? PLT 278 08/24/2021 1456  ? MCV 90.1 08/24/2021 1456  ? MCH 30.8 08/24/2021 1456  ? MCHC 34.2 08/24/2021 1456  ? RDW 12.7 08/24/2021 1456  ? LYMPHSABS  2.7 09/09/2011 2110  ? MONOABS 0.9 09/09/2011 2110  ? EOSABS 1.0 (H) 09/09/2011 2110  ? BASOSABS 0.0 09/09/2011 2110  ? ? ?Hgb A1C ?Lab Results  ?Component Value Date  ? HGBA1C 5.3 12/11/2020  ? ? ? ? ? ? ?   ?Assessment & Plan:  ? ? ?Webb Silversmith, NP ? ?

## 2021-10-12 ENCOUNTER — Other Ambulatory Visit: Payer: Self-pay | Admitting: Internal Medicine

## 2021-10-12 NOTE — Telephone Encounter (Signed)
Rx 06/22/21 #90 1RF- too soon ?Requested Prescriptions  ?Pending Prescriptions Disp Refills  ?? amLODipine (NORVASC) 5 MG tablet [Pharmacy Med Name: AMLODIPINE BESYLATE 5 MG TAB] 90 tablet 1  ?  Sig: TAKE 1 TABLET BY MOUTH EVERY DAY  ?  ? Cardiovascular: Calcium Channel Blockers 2 Passed - 10/12/2021  9:47 AM  ?  ?  Passed - Last BP in normal range  ?  BP Readings from Last 1 Encounters:  ?10/02/21 120/74  ?   ?  ?  Passed - Last Heart Rate in normal range  ?  Pulse Readings from Last 1 Encounters:  ?10/02/21 74  ?   ?  ?  Passed - Valid encounter within last 6 months  ?  Recent Outpatient Visits   ?      ? 1 week ago Irritable bowel syndrome with both constipation and diarrhea  ? [facility] [place], [place] W, NP  ? 1 month ago Ankylosing spondylitis of lumbar region [facility])  ? [facility] [place], [place] W, NP  ? 7 months ago Chronic left shoulder pain  ? [facility] [place], Coralie Keens, NP  ? 10 months ago Encounter for general adult medical examination with abnormal findings  ? [facility] [place], Coralie Keens, NP  ?  ?  ? ?  ?  ?  ? ?

## 2021-10-15 ENCOUNTER — Other Ambulatory Visit: Payer: Self-pay | Admitting: Internal Medicine

## 2021-10-15 MED ORDER — HYDROCODONE-ACETAMINOPHEN 10-325 MG PO TABS: 1.0000 | ORAL_TABLET | Freq: Two times a day (BID) | ORAL | 0 refills | Status: DC

## 2021-10-24 ENCOUNTER — Telehealth: Payer: Self-pay | Admitting: Internal Medicine

## 2021-10-24 DIAGNOSIS — Z1211 Encounter for screening for malignant neoplasm of colon: Secondary | ICD-10-CM

## 2021-10-24 NOTE — Telephone Encounter (Signed)
Pt calling to ask for the in home colo guard test. ? ?

## 2021-10-24 NOTE — Telephone Encounter (Signed)
ordered

## 2021-10-30 ENCOUNTER — Ambulatory Visit: Payer: Self-pay

## 2021-10-30 DIAGNOSIS — M79641 Pain in right hand: Secondary | ICD-10-CM | POA: Diagnosis not present

## 2021-10-30 DIAGNOSIS — M79642 Pain in left hand: Secondary | ICD-10-CM | POA: Diagnosis not present

## 2021-10-30 DIAGNOSIS — G5603 Carpal tunnel syndrome, bilateral upper limbs: Secondary | ICD-10-CM | POA: Diagnosis not present

## 2021-10-30 DIAGNOSIS — G5602 Carpal tunnel syndrome, left upper limb: Secondary | ICD-10-CM | POA: Diagnosis not present

## 2021-10-30 DIAGNOSIS — G5601 Carpal tunnel syndrome, right upper limb: Secondary | ICD-10-CM | POA: Diagnosis not present

## 2021-10-30 NOTE — Telephone Encounter (Signed)
Noted, will review ortho note. She can schedule appt with me later in the week if needed ?

## 2021-10-30 NOTE — Telephone Encounter (Addendum)
?  Chief Complaint: bilateral hand pain ?Symptoms: swollen hands, joints, numbness and tingling to both hands un able to use hands,  ?Frequency: 2 weeks ago ?Pertinent Negatives: Patient denies fever, rash, fever ?Disposition: '[]'$ ED /'[x]'$ Urgent Care (no appt availability in office) / '[]'$ Appointment(In office/virtual)/ '[]'$  [place] [facility]/ '[]'$ Home Care/ '[]'$ Refused Recommended Disposition /'[]'$ [facility]/ '[]'$  Follow-up with PCP ?Additional Notes: Pt advised to go to UC- sent teams message to [facility] no one has responded. Pt stated she may go to orthopedic doctor to see a hand doctor needs to know what to do right now to help her. Refusing UC- plans  to call orthopedic doctor. Pt self treating pain with cannabis - no longer able to use lighter. Pt stated that she is going to take a norco.  ? ? ? ?Reason for Disposition ? Numbness (i.e., loss of sensation) in hand or fingers (Exception: just tingling; numbness present > 2 weeks) ? ?Answer Assessment - Initial Assessment Questions ?1. ONSET: "When did the pain start?" ?    2 weeks ?2. LOCATION: "Where is the pain located?" ?    Hands and goes up forearm ?3. PAIN: "How bad is the pain?" (Scale 1-10; or mild, moderate, severe) ?  - MILD (1-3): doesn't interfere with normal activities ?  - MODERATE (4-7): interferes with normal activities (e.g., work or school) or awakens from sleep ?  - SEVERE (8-10): excruciating pain, unable to use hand at all ?    Comes and goes severe ?4. WORK OR EXERCISE: "Has there been any recent work or exercise that involved this part (i.e., hand or wrist) of the body?" ?    no ?5. CAUSE: "What do you think is causing the pain?" ?    unknown ?6. AGGRAVATING FACTORS: "What makes the pain worse?" (e.g., using computer) ?    Moving fingers  ?7. OTHER SYMPTOMS: "Do you have any other symptoms?" (e.g., neck pain, swelling, rash, numbness, fever) ?    Hands swollen, right worse than left thumb part of hand difficulty with ADL's- h/o carpal  tunnel to both hands ?8. PREGNANCY: "Is there any chance you are pregnant?" "When was your last menstrual period?" ?    N/a ? ?Protocols used: Hand and Wrist Pain-A-AH ? ?

## 2021-11-07 DIAGNOSIS — G5621 Lesion of ulnar nerve, right upper limb: Secondary | ICD-10-CM | POA: Diagnosis not present

## 2021-11-13 ENCOUNTER — Other Ambulatory Visit: Payer: Self-pay | Admitting: Internal Medicine

## 2021-11-13 NOTE — Telephone Encounter (Signed)
Medication Refill - Medication: HYDROcodone-acetaminophen (NORCO) 10-325 MG tablet   Has the patient contacted their pharmacy? No. (Agent: If no, request that the patient contact the pharmacy for the refill. If patient does not wish to contact the pharmacy document the reason why and proceed with request.) (Agent: If yes, when and what did the pharmacy advise?)  Preferred Pharmacy (with phone number or street name): [id number] [place] [address] [id number] Phone:  [phone] Fax:  [phone] Has the patient been seen for an appointment in the last year OR does the patient have an upcoming appointment? Yes.    Agent: Please be advised that RX refills may take up to 3 business days. We ask that you follow-up with your pharmacy.

## 2021-11-14 MED ORDER — HYDROCODONE-ACETAMINOPHEN 10-325 MG PO TABS
1.0000 | ORAL_TABLET | Freq: Two times a day (BID) | ORAL | 0 refills | Status: DC
Start: 2021-11-14 — End: 2021-12-13

## 2021-11-14 NOTE — Telephone Encounter (Signed)
Requested medication (s) are due for refill today: Yes  Requested medication (s) are on the active medication list: Yes  Last refill:  10/15/21  Future visit scheduled: No  Notes to clinic:  See request.    Requested Prescriptions  Pending Prescriptions Disp Refills   HYDROcodone-acetaminophen (NORCO) 10-325 MG tablet 60 tablet 0    Sig: Take 1 tablet by mouth 2 (two) times daily.     Not Delegated - Analgesics:  Opioid Agonist Combinations Failed - 11/13/2021 12:53 PM      Failed - This refill cannot be delegated      Failed - Urine Drug Screen completed in last 360 days      Passed - Valid encounter within last 3 months    Recent Outpatient Visits           1 month ago Irritable bowel syndrome with both constipation and diarrhea   [facility] [place], [place] W, NP   2 months ago Ankylosing spondylitis of lumbar region [facility])   [facility], NP   8 months ago Chronic left shoulder pain   [facility] [place], Coralie Keens, NP   11 months ago Encounter for general adult medical examination with abnormal findings   [facility] [place], Coralie Keens, NP

## 2021-12-11 ENCOUNTER — Encounter: Payer: Self-pay | Admitting: Internal Medicine

## 2021-12-11 ENCOUNTER — Ambulatory Visit (INDEPENDENT_AMBULATORY_CARE_PROVIDER_SITE_OTHER): Payer: No Typology Code available for payment source | Admitting: Internal Medicine

## 2021-12-11 VITALS — BP 128/64 | HR 83 | Temp 96.9°F | Wt 209.0 lb

## 2021-12-11 DIAGNOSIS — F419 Anxiety disorder, unspecified: Secondary | ICD-10-CM | POA: Diagnosis not present

## 2021-12-11 DIAGNOSIS — F32A Depression, unspecified: Secondary | ICD-10-CM

## 2021-12-11 DIAGNOSIS — M456 Ankylosing spondylitis lumbar region: Secondary | ICD-10-CM

## 2021-12-11 DIAGNOSIS — M797 Fibromyalgia: Secondary | ICD-10-CM

## 2021-12-11 DIAGNOSIS — K582 Mixed irritable bowel syndrome: Secondary | ICD-10-CM | POA: Diagnosis not present

## 2021-12-11 DIAGNOSIS — Z0289 Encounter for other administrative examinations: Secondary | ICD-10-CM | POA: Diagnosis not present

## 2021-12-11 DIAGNOSIS — G894 Chronic pain syndrome: Secondary | ICD-10-CM | POA: Diagnosis not present

## 2021-12-13 ENCOUNTER — Other Ambulatory Visit: Payer: Self-pay | Admitting: Internal Medicine

## 2021-12-13 MED ORDER — HYDROCODONE-ACETAMINOPHEN 10-325 MG PO TABS
1.0000 | ORAL_TABLET | Freq: Two times a day (BID) | ORAL | 0 refills | Status: DC
Start: 2021-12-13 — End: 2021-12-17

## 2021-12-17 ENCOUNTER — Other Ambulatory Visit: Payer: Self-pay

## 2021-12-17 ENCOUNTER — Other Ambulatory Visit: Payer: Self-pay | Admitting: Internal Medicine

## 2021-12-17 MED ORDER — HYDROCODONE-ACETAMINOPHEN 10-325 MG PO TABS
1.0000 | ORAL_TABLET | Freq: Two times a day (BID) | ORAL | 0 refills | Status: DC
Start: 2021-12-17 — End: 2021-12-19

## 2021-12-17 NOTE — Telephone Encounter (Signed)
Requested medication (s) are due for refill today - no  Requested medication (s) are on the active medication list -yes  Future visit scheduled -no  Last refill: 12/17/21 #60  Notes to clinic: Request Rx be sent to another pharmacy   Requested Prescriptions  Pending Prescriptions Disp Refills   HYDROcodone-acetaminophen (NORCO) 10-325 MG tablet 60 tablet 0    Sig: Take 1 tablet by mouth 2 (two) times daily.     Not Delegated - Analgesics:  Opioid Agonist Combinations Failed - 12/17/2021  2:00 PM      Failed - This refill cannot be delegated      Failed - Urine Drug Screen completed in last 360 days      Passed - Valid encounter within last 3 months    Recent Outpatient Visits           6 days ago Irritable bowel syndrome with both constipation and diarrhea   [facility] [place], [place] W, NP   2 months ago Irritable bowel syndrome with both constipation and diarrhea   [facility] [place], [place] W, NP   3 months ago Ankylosing spondylitis of lumbar region [facility])   [facility], Coralie Keens, NP   9 months ago Chronic left shoulder pain   [facility] [place], Coralie Keens, NP   1 year ago Encounter for general adult medical examination with abnormal findings   [place], Coralie Keens, NP       Future Appointments             In 3 months Garnette Gunner, Coralie Keens, NP [facility], [facility]               Requested Prescriptions  Pending Prescriptions Disp Refills   HYDROcodone-acetaminophen (NORCO) 10-325 MG tablet 60 tablet 0    Sig: Take 1 tablet by mouth 2 (two) times daily.     Not Delegated - Analgesics:  Opioid Agonist Combinations Failed - 12/17/2021  2:00 PM      Failed - This refill cannot be delegated      Failed - Urine Drug Screen completed in last 360 days      Passed - Valid encounter within last 3 months    Recent Outpatient Visits           6 days ago Irritable bowel  syndrome with both constipation and diarrhea   [facility] [place], [place] W, NP   2 months ago Irritable bowel syndrome with both constipation and diarrhea   [facility] [place], [place] W, NP   3 months ago Ankylosing spondylitis of lumbar region [facility])   [facility], NP   9 months ago Chronic left shoulder pain   [facility] [place], Coralie Keens, NP   1 year ago Encounter for general adult medical examination with abnormal findings   [facility] [place], Coralie Keens, NP       Future Appointments             In 3 months Baity, Coralie Keens, NP [facility], [facility]

## 2021-12-17 NOTE — Telephone Encounter (Signed)
Pt found that [facility] has Hydrocodone / please resend refill to this pharmacy asap/ [place], [place]

## 2021-12-17 NOTE — Telephone Encounter (Signed)
[facility] not have [facility]co in [facility].  Please send to [place].   Thanks,   -Mickel Baas

## 2021-12-17 NOTE — Telephone Encounter (Signed)
Patient request medication be forwarded to another pharmacy for refill. See refill note

## 2021-12-17 NOTE — Telephone Encounter (Signed)
Copied from [place]. Topic: General - Other >> Dec 17, 2021  8:39 AM Everette C wrote: Reason for CRM: Medication Refill - Medication: HYDROcodone-acetaminophen (NORCO) 10-325 MG tablet [[id number]]   Has the patient contacted their pharmacy? Yes.  The patient has been directed to contact their PCP (Agent: If no, request that the patient contact the pharmacy for the refill. If patient does not wish to contact the pharmacy document the reason why and proceed with request.) (Agent: If yes, when and what did the pharmacy advise?)  Preferred Pharmacy (with phone number or street name): [id number] [place] [id number]: [phone]: [phone]: Not open 24 hours  Has the patient been seen for an appointment in the last year OR does the patient have an upcoming appointment? Yes.    Agent: Please be advised that RX refills may take up to 3 business days. We ask that you follow-up with your pharmacy.

## 2021-12-19 ENCOUNTER — Other Ambulatory Visit: Payer: Self-pay | Admitting: Internal Medicine

## 2021-12-19 MED ORDER — HYDROCODONE-ACETAMINOPHEN 10-325 MG PO TABS
1.0000 | ORAL_TABLET | Freq: Two times a day (BID) | ORAL | 0 refills | Status: DC
Start: 2021-12-19 — End: 2022-01-18

## 2021-12-25 DIAGNOSIS — G5601 Carpal tunnel syndrome, right upper limb: Secondary | ICD-10-CM | POA: Diagnosis not present

## 2021-12-25 DIAGNOSIS — G5603 Carpal tunnel syndrome, bilateral upper limbs: Secondary | ICD-10-CM | POA: Diagnosis not present

## 2021-12-25 DIAGNOSIS — M13841 Other specified arthritis, right hand: Secondary | ICD-10-CM | POA: Diagnosis not present

## 2021-12-28 ENCOUNTER — Encounter: Payer: Self-pay | Admitting: Podiatry

## 2021-12-28 ENCOUNTER — Ambulatory Visit (INDEPENDENT_AMBULATORY_CARE_PROVIDER_SITE_OTHER): Payer: No Typology Code available for payment source | Admitting: Podiatry

## 2021-12-28 ENCOUNTER — Ambulatory Visit (INDEPENDENT_AMBULATORY_CARE_PROVIDER_SITE_OTHER): Payer: No Typology Code available for payment source

## 2021-12-28 DIAGNOSIS — M7662 Achilles tendinitis, left leg: Secondary | ICD-10-CM

## 2021-12-28 DIAGNOSIS — M79672 Pain in left foot: Secondary | ICD-10-CM

## 2021-12-28 MED ORDER — TRIAMCINOLONE ACETONIDE 10 MG/ML IJ SUSP
10.0000 mg | Freq: Once | INTRAMUSCULAR | Status: AC
  Administered 2021-12-28: 10 mg

## 2021-12-28 NOTE — Patient Instructions (Signed)

## 2021-12-31 NOTE — Progress Notes (Signed)
Subjective:   Patient ID: Belinda Calderon, female   DOB: 61 y.o.   MRN: [id number]   HPI Patient presents with a painful posterior heel left that is making it hard for her to walk as she does not remember specific injury.  States its been present for several months and increasingly makes shoe gear difficult.  Patient does not smoke likes to be active   Review of Systems  All other systems reviewed and are negative.       Objective:  Physical Exam Vitals and nursing note reviewed.  Constitutional:      Appearance: She is well-developed.  Pulmonary:     Effort: Pulmonary effort is normal.  Musculoskeletal:        General: Normal range of motion.  Skin:    General: Skin is warm.  Neurological:     Mental Status: She is alert.     Neurovascular status found to be intact muscle strength was found to be adequate range of motion adequate.  Patient does have quite a bit of discomfort posterior lateral aspect left heel with a central medial portion of the Achilles insertion good and no pain in the muscle tendon junction.  Patient is found to have good digital perfusion well oriented x3 with mild equinus condition     Assessment:  Acute Achilles tendinitis left with inflammation fluid at insertion lateral side     Plan:  H&P reviewed condition and went ahead today and discussed injection explaining chances for risk.  She wants to do this I did explain chances for rupture associated with injection and we will stick to the lateral side and she is willing to accept this procedure and I went ahead did sterile prep and injected the posterior lateral aspect of the Achilles at insertion staying away from the center and medial aspect of the tendon with 3 mg dexamethasone Kenalog combination 5 mg Xylocaine advised on ice therapy and support along with heel lift treatment  X-rays indicate there is quite a bit of spurring in the posterior aspect of the left heel

## 2022-01-09 ENCOUNTER — Other Ambulatory Visit: Payer: Self-pay | Admitting: Podiatry

## 2022-01-09 DIAGNOSIS — M7662 Achilles tendinitis, left leg: Secondary | ICD-10-CM

## 2022-01-11 DIAGNOSIS — M13841 Other specified arthritis, right hand: Secondary | ICD-10-CM | POA: Diagnosis not present

## 2022-01-11 DIAGNOSIS — M13842 Other specified arthritis, left hand: Secondary | ICD-10-CM | POA: Diagnosis not present

## 2022-01-15 ENCOUNTER — Other Ambulatory Visit: Payer: Self-pay | Admitting: Internal Medicine

## 2022-01-16 ENCOUNTER — Ambulatory Visit (INDEPENDENT_AMBULATORY_CARE_PROVIDER_SITE_OTHER): Payer: No Typology Code available for payment source | Admitting: Internal Medicine

## 2022-01-16 ENCOUNTER — Encounter: Payer: Self-pay | Admitting: Internal Medicine

## 2022-01-16 VITALS — BP 154/98 | HR 65 | Temp 98.0°F | Wt 209.0 lb

## 2022-01-16 DIAGNOSIS — M456 Ankylosing spondylitis lumbar region: Secondary | ICD-10-CM | POA: Diagnosis not present

## 2022-01-16 DIAGNOSIS — M5416 Radiculopathy, lumbar region: Secondary | ICD-10-CM | POA: Diagnosis not present

## 2022-01-16 NOTE — Progress Notes (Unsigned)
Subjective:    Patient ID: Belinda Calderon, female    DOB: 02/06/1961, 61 y.o.   MRN: [id number]  HPI  Patient presents to clinic today requesting a referral to neurosurgery.  She has a history of ankylosing spondylitis.  MRI lumbar spine from 04/2017 showed:  IMPRESSION: Moderately severe to severe right foraminal narrowing at L3-4 appears worse than on the prior MRI. Moderate left and moderate to moderately severe central canal stenosis at L3-4 are stable in appearance.   No change in narrowing in the right subarticular recess and foramen at L5-S1 due to a broad-based protrusion with endplate spurring.   No change in mild to moderate central canal stenosis and bilateral foraminal narrowing at L4-5.  She reports worsening pain in the last few days.  She has noticed some swelling just to the left of the spine as well.  She reports the pain is so bad, she is unable to bend, has difficulty getting from a sitting to a standing position.  She is now walking with a cane.  She has associated numbness, tingling and weakness of her lower extremities.  Review of Systems  Past Medical History:  Diagnosis Date   Ankylosing spondylitis (HCC)    Degenerative disc disease    Fibromyalgia    Hypertension    IBS (irritable bowel syndrome)     Current Outpatient Medications  Medication Sig Dispense Refill   amLODipine (NORVASC) 5 MG tablet TAKE 1 TABLET BY MOUTH EVERY DAY 90 tablet 1   celecoxib (CELEBREX) 50 MG capsule Take 1 capsule (50 mg total) by mouth 2 (two) times daily. (Patient not taking: Reported on 12/11/2021) 60 capsule 2   cholecalciferol (VITAMIN D3) 25 MCG (1000 UNIT) tablet Take 1,000 Units by mouth daily.     clonazePAM (KLONOPIN) 1 MG tablet Take 0.5 tablets (0.5 mg total) by mouth daily as needed for anxiety. 30 tablet 0   HYDROcodone-acetaminophen (NORCO) 10-325 MG tablet Take 1 tablet by mouth 2 (two) times daily. 60 tablet 0   lisinopril-hydrochlorothiazide (ZESTORETIC)  20-12.5 MG tablet Take 2 tablets by mouth daily. 180 tablet 0   pantoprazole (PROTONIX) 40 MG tablet Take 1 tablet (40 mg total) by mouth daily. (Patient not taking: Reported on 12/11/2021) 90 tablet 0   predniSONE (DELTASONE) 5 MG tablet Take as directed, 6 day taper.     No current facility-administered medications for this visit.    Allergies  Allergen Reactions   Sulfa Antibiotics Hives and Swelling   Ibuprofen     GI IRRITATION    Family History  Problem Relation Age of Onset   Diabetes Mother    Arthritis Mother    Arthritis Father    Hyperlipidemia Father    Stroke Father    Hypertension Father    Diabetes Father    Arthritis Maternal Grandmother    Arthritis Maternal Grandfather    Arthritis Paternal Grandmother    Heart disease Paternal Grandmother    Stroke Paternal Grandmother    Hypertension Paternal Grandmother    Diabetes Paternal Grandmother    Arthritis Paternal Grandfather    Hypertension Paternal Grandfather    Diabetes Paternal Grandfather     Social History   Socioeconomic History   Marital status: Married    Spouse name: Not on file   Number of children: Not on file   Years of education: Not on file   Highest education level: Not on file  Occupational History   Not on file  Tobacco Use  Smoking status: Never   Smokeless tobacco: Never  Vaping Use   Vaping Use: Never used  Substance and Sexual Activity   Alcohol use: Yes    Comment: rare   Drug use: Yes    Types: Marijuana    Comment: Cannabis oil   Sexual activity: Yes  Other Topics Concern   Not on file  Social History Narrative   Not on file   Social Determinants of Health   Financial Resource Strain: Not on file  Food Insecurity: Not on file  Transportation Needs: Not on file  Physical Activity: Not on file  Stress: Not on file  Social Connections: Not on file  Intimate Partner Violence: Not on file     Constitutional: Denies fever, malaise, fatigue, headache or abrupt  weight changes.  Respiratory: Denies difficulty breathing, shortness of breath, cough or sputum production.   Cardiovascular: Denies chest pain, chest tightness, palpitations or swelling in the hands or feet.  Musculoskeletal: Patient reports chronic low back pain, decrease in range of motion, difficulty with gait.  Denies or joint swelling.  Skin: Denies redness, rashes, lesions or ulcercations.  Neurological: Patient reports numbness, tingling and weakness of her lower extremities.  Denies dizziness, difficulty with memory, difficulty with speech or problems with coordination.    No other specific complaints in a complete review of systems (except as listed in HPI above).     Objective:   Physical Exam   LMP  (LMP Unknown) BP (!) 154/98 (BP Location: Right Arm, Patient Position: Sitting, Cuff Size: Normal)   Pulse 65   Temp 98 F (36.7 C) (Temporal)   Wt 209 lb (94.8 kg)   LMP  (LMP Unknown)   SpO2 99%   BMI 32.73 kg/m     General: Appears her stated age, obese, in NAD. Skin: 1.5 inches of swelling just to the left of the lumbar spine. Cardiovascular: Normal rate and rhythm. Pulmonary/Chest: Normal effort and positive vesicular breath sounds. No respiratory distress. No wheezes, rales or ronchi noted.  Musculoskeletal: Decreased flexion, extension of the spine secondary to pain.  Normal rotation.  Bony tenderness noted over the lumbar spine.  She has noted difficulty getting from sitting to a standing position.  Gait slow and steady with use of cane. Neurological: Alert and oriented.  Positive SLR bilaterally at 30 degrees.   BMET    Component Value Date/Time   NA 138 08/24/2021 1456   K 3.4 (L) 08/24/2021 1456   CL 103 08/24/2021 1456   CO2 27 08/24/2021 1456   GLUCOSE 120 08/24/2021 1456   BUN 18 08/24/2021 1456   CREATININE 0.63 08/24/2021 1456   CALCIUM 9.4 08/24/2021 1456   GFRNONAA 96 12/11/2020 0844   GFRAA 112 12/11/2020 0844    Lipid Panel     Component  Value Date/Time   CHOL 173 08/24/2021 1456   TRIG 299 (H) 08/24/2021 1456   HDL 31 (L) 08/24/2021 1456   CHOLHDL 5.6 (H) 08/24/2021 1456   VLDL 63.0 (H) 12/13/2019 1254   LDLCALC 102 (H) 08/24/2021 1456    CBC    Component Value Date/Time   WBC 10.3 08/24/2021 1456   RBC 4.35 08/24/2021 1456   HGB 13.4 08/24/2021 1456   HCT 39.2 08/24/2021 1456   PLT 278 08/24/2021 1456   MCV 90.1 08/24/2021 1456   MCH 30.8 08/24/2021 1456   MCHC 34.2 08/24/2021 1456   RDW 12.7 08/24/2021 1456   LYMPHSABS 2.7 09/09/2011 2110   MONOABS 0.9 09/09/2011 2110  EOSABS 1.0 (H) 09/09/2011 2110   BASOSABS 0.0 09/09/2011 2110    Hgb A1C Lab Results  Component Value Date   HGBA1C 5.3 12/11/2020           Assessment & Plan:   Ankylosing Spondylitis, Lumbar Radiculopathy:  We will obtain MRI lumbar spine Referral to neurosurgery placed for further evaluation and treatment Encouraged ice to help reduce swelling Recommend Vicodin as previously prescribed  RTC in 3 months for your annual exam Webb Silversmith, NP

## 2022-01-16 NOTE — Telephone Encounter (Signed)
Pt has not taken BP med request sent yesterday needs  asap, has had a headache for 3 days. Pls send this am if possible

## 2022-01-17 ENCOUNTER — Encounter: Payer: Self-pay | Admitting: Internal Medicine

## 2022-01-17 NOTE — Patient Instructions (Signed)

## 2022-01-18 ENCOUNTER — Other Ambulatory Visit: Payer: Self-pay | Admitting: Internal Medicine

## 2022-01-18 MED ORDER — HYDROCODONE-ACETAMINOPHEN 10-325 MG PO TABS: 1.0000 | ORAL_TABLET | Freq: Two times a day (BID) | ORAL | 0 refills | Status: DC

## 2022-01-18 NOTE — Telephone Encounter (Signed)
Requested medication (s) are due for refill today: yes  Requested medication (s) are on the active medication list: yes  Last refill:  12/19/21 #60/0  Future visit scheduled: yes  Notes to clinic:  Unable to refill per protocol, cannot delegate.      Requested Prescriptions  Pending Prescriptions Disp Refills   HYDROcodone-acetaminophen (NORCO) 10-325 MG tablet 60 tablet 0    Sig: Take 1 tablet by mouth 2 (two) times daily.     Not Delegated - Analgesics:  Opioid Agonist Combinations Failed - 01/18/2022  9:48 AM      Failed - This refill cannot be delegated      Failed - Urine Drug Screen completed in last 360 days      Passed - Valid encounter within last 3 months    Recent Outpatient Visits           2 days ago Ankylosing spondylitis of lumbar region [facility])   [facility] [place], Coralie Keens, NP   1 month ago Irritable bowel syndrome with both constipation and diarrhea   [facility] [place], [place] W, NP   3 months ago Irritable bowel syndrome with both constipation and diarrhea   [facility] [place], [place] W, NP   4 months ago Ankylosing spondylitis of lumbar region [facility])   [facility], NP   10 months ago Chronic left shoulder pain   [facility] [place], Coralie Keens, NP       Future Appointments             In 2 months Baity, Coralie Keens, NP [facility], [facility]

## 2022-01-18 NOTE — Telephone Encounter (Signed)
Medication Refill - Medication: HYDROcodone-acetaminophen (NORCO) 10-325 MG tablet  Has the patient contacted their pharmacy? Yes.     Preferred Pharmacy (with phone number or street name[facility]  [id number] [place]  [phone] Fax:  [phone]    Has the patient been seen for an appointment in the last year OR does the patient have an upcoming appointment? Yes.

## 2022-01-24 ENCOUNTER — Ambulatory Visit (INDEPENDENT_AMBULATORY_CARE_PROVIDER_SITE_OTHER): Payer: No Typology Code available for payment source

## 2022-01-24 VITALS — BP 123/65 | HR 71 | Temp 98.0°F | Resp 18 | Ht 67.0 in | Wt 209.0 lb

## 2022-01-24 DIAGNOSIS — Z0000 Encounter for general adult medical examination without abnormal findings: Secondary | ICD-10-CM | POA: Diagnosis not present

## 2022-01-24 NOTE — Progress Notes (Signed)
Subjective:     Belinda Calderon is a 61 y.o. female who presents for Medicare Annual (Subsequent) preventive examination.  Review of Systems    Per HPI unless specifically indicated below Cardiac Risk Factors include: advanced age (>86mn, >>52women);female gender          Objective:    Today's Vitals   01/24/22 0943 01/24/22 0953  BP: 123/65   Pulse: 71   Resp: 18   Temp: 98 F (36.7 C)   TempSrc: Oral   SpO2: 99%   Weight: 209 lb (94.8 kg)   Height: '5\' 7"'$  (1.702 m)   PainSc: 3  3    Body mass index is 32.73 kg/m.     12/08/2018   12:21 PM  Advanced Directives  Does Patient Have a Medical Advance Directive? No  Would patient like information on creating a medical advance directive? No - Patient declined    Current Medications (verified) Outpatient Encounter Medications as of 01/24/2022  Medication Sig   amLODipine (NORVASC) 5 MG tablet TAKE 1 TABLET BY MOUTH EVERY DAY   cholecalciferol (VITAMIN D3) 25 MCG (1000 UNIT) tablet Take 1,000 Units by mouth daily.   HYDROcodone-acetaminophen (NORCO) 10-325 MG tablet Take 1 tablet by mouth 2 (two) times daily.   lisinopril-hydrochlorothiazide (ZESTORETIC) 20-12.5 MG tablet TAKE 2 TABLETS BY MOUTH DAILY   No facility-administered encounter medications on file as of 01/24/2022.    Allergies (verified) Sulfa antibiotics and Ibuprofen   History: Past Medical History:  Diagnosis Date   Ankylosing spondylitis (HCC)    Degenerative disc disease    Fibromyalgia    Hypertension    IBS (irritable bowel syndrome)    Past Surgical History:  Procedure Laterality Date   ABDOMINAL HYSTERECTOMY     partial   CESAREAN SECTION     KNEE SURGERY     SPINE SURGERY  04/24/2017   TONSILLECTOMY     Family History  Problem Relation Age of Onset   Diabetes Mother    Arthritis Mother    Arthritis Father    Hyperlipidemia Father    Stroke Father    Hypertension Father    Diabetes Father    Arthritis Maternal Grandmother     Arthritis Maternal Grandfather    Arthritis Paternal Grandmother    Heart disease Paternal Grandmother    Stroke Paternal Grandmother    Hypertension Paternal Grandmother    Diabetes Paternal Grandmother    Arthritis Paternal Grandfather    Hypertension Paternal Grandfather    Diabetes Paternal Grandfather    Social History   Socioeconomic History   Marital status: Married    Spouse name: DLugene Hitt  Number of children: 3   Years of education: Not on file   Highest education level: Not on file  Occupational History   Occupation: Disable  Tobacco Use   Smoking status: Never   Smokeless tobacco: Never  Vaping Use   Vaping Use: Some days   Substances: Mixture of cannabinoids  Substance and Sexual Activity   Alcohol use: Yes    Comment: rare   Drug use: Yes    Types: Marijuana    Comment: Cannabis oil   Sexual activity: Yes  Other Topics Concern   Not on file  Social History Narrative   Not on file   Social Determinants of Health   Financial Resource Strain: Low Risk  (01/24/2022)   Overall Financial Resource Strain (CARDIA)    Difficulty of Paying Living Expenses: Not hard at all  Food Insecurity: No Food Insecurity (01/24/2022)   Hunger Vital Sign    Worried About Running Out of Food in the Last Year: Never true    Ran Out of Food in the Last Year: Never true  Transportation Needs: No Transportation Needs (01/24/2022)   PRAPARE - Hydrologist (Medical): No    Lack of Transportation (Non-Medical): No  Physical Activity: Insufficiently Active (01/24/2022)   Exercise Vital Sign    Days of Exercise per Week: 4 days    Minutes of Exercise per Session: 30 min  Stress: Stress Concern Present (01/24/2022)   [place]    Feeling of Stress : Very much  Social Connections: Moderately Isolated (01/24/2022)   Social Connection and Isolation Panel [NHANES]    Frequency of  Communication with Friends and Family: More than three times a week    Frequency of Social Gatherings with Friends and Family: More than three times a week    Attends Religious Services: Never    Marine scientist or Organizations: No    Attends Music therapist: Never    Marital Status: Married    Tobacco Counseling Counseling given: Not Answered   Clinical Intake:     Pain : 0-10 Pain Score: 3  Pain Type: Chronic pain, Neuropathic pain Pain Location: Leg Pain Orientation: Other (Comment) Pain Descriptors / Indicators: Aching, Constant, Shooting, Sharp Pain Onset: More than a month ago Pain Frequency: Constant Pain Relieving Factors: cannabis  Pain Relieving Factors: cannabis  Nutritional Status: BMI > 30  Obese Nutritional Risks: Nausea/ vomitting/ diarrhea Diabetes: No  How often do you need to have someone help you when you read instructions, pamphlets, or other written materials from your doctor or pharmacy?: 1 - Never  Diabetic?No     Information entered by :: Donnie Mesa, cMA   Activities of Daily Living    01/24/2022    9:56 AM  In your present state of health, do you have any difficulty performing the following activities:  Hearing? 1  Vision? 1  Difficulty concentrating or making decisions? 1  Walking or climbing stairs? 1  Dressing or bathing? 0  Doing errands, shopping? 0    Patient Care Team: Jearld Fenton, NP as PCP - General (Internal Medicine)  Indicate any recent Medical Services you may have received from other than [facility] providers in the past year (date may be approximate).  No hospitalization in the past 12 months.     Assessment:   This is a routine wellness examination for Belinda Calderon.  Hearing/Vision screen No results found.  Dietary issues and exercise activities discussed: Current Exercise Habits: Home exercise routine;Structured exercise class, Time (Minutes): 40, Frequency (Times/Week): 3, Weekly Exercise  (Minutes/Week): 120, Intensity: Mild, Exercise limited by: orthopedic condition(s);respiratory conditions(s)   Goals Addressed   None    Depression Screen    01/24/2022    9:57 AM 02/28/2021    9:54 AM 12/27/2019    4:01 PM 12/13/2019   12:13 PM 02/04/2019    3:49 PM 12/10/2018   10:25 AM 05/20/2017   10:17 AM  PHQ 2/9 Scores  PHQ - 2 Score '5 6 2 '$ 0 3 1 0  PHQ- 9 Score '24 24 6 '$ 0 8 2     Fall Risk    01/24/2022    9:56 AM 02/28/2021    9:54 AM 12/10/2018   10:23 AM 05/20/2017   10:17 AM 03/13/2016  3:34 PM  Fall Risk   Falls in the past year? 0 0 1 No No  Number falls in past yr: 0 0 0    Injury with Fall? 0 0 1    Risk for fall due to : History of fall(s);No Fall Risks No Fall Risks     Follow up Falls evaluation completed Falls evaluation completed       [place]:  Any stairs in or around the home? Yes  If so, are there any without handrails? Yes  Home free of loose throw rugs in walkways, pet beds, electrical cords, etc? Yes  Adequate lighting in your home to reduce risk of falls? Yes   ASSISTIVE DEVICES UTILIZED TO PREVENT FALLS:  Life alert? No  Use of a cane, walker or w/c? Yes  Grab bars in the bathroom? Yes  Shower chair or bench in shower? Yes  Elevated toilet seat or a handicapped toilet? Yes   TIMED UP AND GO:  Was the test performed? Yes .  Length of time to ambulate 10 feet: 10 sec.   Gait slow and steady without use of assistive device  Cognitive Function:        01/24/2022   10:00 AM  6CIT Screen  What Year? 0 points  What month? 0 points  What time? 0 points  Count back from 20 0 points  Months in reverse 0 points  Repeat phrase 0 points  Total Score 0 points    Immunizations Immunization History  Administered Date(s) Administered   Td 09/09/2011    TDAP status: Due, Education has been provided regarding the importance of this vaccine. Advised may receive this vaccine at local pharmacy or Health  Dept. Aware to provide a copy of the vaccination record if obtained from local pharmacy or Health Dept. Verbalized acceptance and understanding.  Flu Vaccine status: Due, Education has been provided regarding the importance of this vaccine. Advised may receive this vaccine at local pharmacy or Health Dept. Aware to provide a copy of the vaccination record if obtained from local pharmacy or Health Dept. Verbalized acceptance and understanding.  Pneumococcal vaccine status: Declined,  Education has been provided regarding the importance of this vaccine but patient still declined. Advised may receive this vaccine at local pharmacy or Health Dept. Aware to provide a copy of the vaccination record if obtained from local pharmacy or Health Dept. Verbalized acceptance and understanding.   Covid-19 vaccine status: Declined, Education has been provided regarding the importance of this vaccine but patient still declined. Advised may receive this vaccine at local pharmacy or Health Dept.or vaccine clinic. Aware to provide a copy of the vaccination record if obtained from local pharmacy or Health Dept. Verbalized acceptance and understanding.  Qualifies for Shingles Vaccine? Yes   Zostavax completed No   Shingrix Completed?: No.    Education has been provided regarding the importance of this vaccine. Patient has been advised to call insurance company to determine out of pocket expense if they have not yet received this vaccine. Advised may also receive vaccine at local pharmacy or Health Dept. Verbalized acceptance and understanding.  Screening Tests Health Maintenance  Topic Date Due   INFLUENZA VACCINE  01/15/2022   COVID-19 Vaccine (1) 02/09/2022 (Originally 05/26/1966)   MAMMOGRAM  04/05/2022 (Originally 03/10/2014)   Zoster Vaccines- Shingrix (1 of 2) 04/26/2022 (Originally 05/26/1980)   TETANUS/TDAP  10/03/2022 (Originally 09/08/2021)   Fecal DNA (Cologuard)  09/25/2022   HIV Screening  Completed  Hepatitis C Screening  Addressed   HPV VACCINES  Aged Out    Health Maintenance  Health Maintenance Due  Topic Date Due   INFLUENZA VACCINE  01/15/2022    Colorectal cancer screening: Type of screening: Cologuard. Completed 09/25/2019. Repeat every 3 years  Mammogram: pt declined mammogram   DEXA Scan: nor needed   Lung Cancer Screening: (Low Dose CT Chest recommended if Age 98-80 years, 30 pack-year currently smoking OR have quit w/in 15years.) does not qualify.   Lung Cancer Screening Referral: does not qualify   Additional Screening:  Hepatitis C Screening: does qualify; Completed 04/02/2016  Vision Screening: Recommended annual ophthalmology exams for early detection of glaucoma and other disorders of the eye. Is the patient up to date with their annual eye exam?  Yes  Who is the provider or what is the name of the office in which the patient attends annual eye exams? [facility]  If pt is not established with a provider, would they like to be referred to a provider to establish care? No .   Dental Screening: Recommended annual dental exams for proper oral hygiene  Community Resource Referral / Chronic Care Management: CRR required this visit?  No   CCM required this visit?  No      Plan:     I have personally reviewed and noted the following in the patient's chart:   Medical and social history Use of alcohol, tobacco or illicit drugs  Current medications and supplements including opioid prescriptions.  Functional ability and status Nutritional status Physical activity Advanced directives List of other physicians Hospitalizations, surgeries, and ER visits in previous 12 months Vitals Screenings to include cognitive, depression, and falls Referrals and appointments  In addition, I have reviewed and discussed with patient certain preventive protocols, quality metrics, and best practice recommendations. A written personalized care plan for preventive  services as well as general preventive health recommendations were provided to patient.    Ms. Ureste , Thank you for taking time to come for your Medicare Wellness Visit. I appreciate your ongoing commitment to your health goals. Please review the following plan we discussed and let me know if I can assist you in the future.   These are the goals we discussed:  Goals   None     This is a list of the screening recommended for you and due dates:  Health Maintenance  Topic Date Due   Flu Shot  01/15/2022   COVID-19 Vaccine (1) 02/09/2022*   Mammogram  04/05/2022*   Zoster (Shingles) Vaccine (1 of 2) 04/26/2022*   Tetanus Vaccine  10/03/2022*   Cologuard (Stool DNA test)  09/25/2022   HIV Screening  Completed   Hepatitis C Screening: USPSTF Recommendation to screen - Ages 80-79 yo.  Addressed   HPV Vaccine  Aged Out  *Topic was postponed. The date shown is not the original due date.     Wilson Singer, [place]   01/24/2022   Nurse Notes: 40 minute face to face

## 2022-01-24 NOTE — Patient Instructions (Signed)

## 2022-02-19 ENCOUNTER — Other Ambulatory Visit: Payer: Self-pay | Admitting: Internal Medicine

## 2022-02-19 NOTE — Telephone Encounter (Signed)
Pt called in to request a refill for her HYDROcodone-acetaminophen (NORCO) 10-325 MG tablet. Pt says that she was told to by her pharmacy to contact her PCP.   [facility]armacy[facility]  [id number] [place] [place]:  [phone] Fax:  [phone]    Future appt: 03/21/22

## 2022-02-20 NOTE — Telephone Encounter (Signed)
Requested medications are due for refill today.  yes  Requested medications are on the active medications list.  yes  Last refill. 01/18/2022 #60 0 refills  Future visit scheduled.   yes  Notes to clinic.  Medication refill not delegated.    Requested Prescriptions  Pending Prescriptions Disp Refills   HYDROcodone-acetaminophen (NORCO) 10-325 MG tablet 60 tablet 0    Sig: Take 1 tablet by mouth 2 (two) times daily.     Not Delegated - Analgesics:  Opioid Agonist Combinations Failed - 02/19/2022  2:04 PM      Failed - This refill cannot be delegated      Failed - Urine Drug Screen completed in last 360 days      Passed - Valid encounter within last 3 months    Recent Outpatient Visits           1 month ago Ankylosing spondylitis of lumbar region [facility])   [facility] [place], Coralie Keens, NP   2 months ago Irritable bowel syndrome with both constipation and diarrhea   [facility] [place], [place] W, NP   4 months ago Irritable bowel syndrome with both constipation and diarrhea   [facility] [place], [place] W, NP   6 months ago Ankylosing spondylitis of lumbar region [facility])   [facility], NP   11 months ago Chronic left shoulder pain   [facility] [place], Coralie Keens, NP       Future Appointments             In 4 weeks Garnette Gunner, Coralie Keens, NP [facility], [facility]

## 2022-02-21 MED ORDER — HYDROCODONE-ACETAMINOPHEN 10-325 MG PO TABS: 1.0000 | ORAL_TABLET | Freq: Two times a day (BID) | ORAL | 0 refills | Status: DC

## 2022-03-21 ENCOUNTER — Ambulatory Visit (INDEPENDENT_AMBULATORY_CARE_PROVIDER_SITE_OTHER): Payer: No Typology Code available for payment source | Admitting: Internal Medicine

## 2022-03-21 ENCOUNTER — Encounter: Payer: Self-pay | Admitting: Internal Medicine

## 2022-03-21 VITALS — BP 134/80 | HR 87 | Temp 97.1°F | Ht 67.0 in | Wt 207.0 lb

## 2022-03-21 DIAGNOSIS — I700 Atherosclerosis of aorta: Secondary | ICD-10-CM | POA: Diagnosis not present

## 2022-03-21 DIAGNOSIS — E6609 Other obesity due to excess calories: Secondary | ICD-10-CM

## 2022-03-21 DIAGNOSIS — Z6832 Body mass index (BMI) 32.0-32.9, adult: Secondary | ICD-10-CM

## 2022-03-21 DIAGNOSIS — D649 Anemia, unspecified: Secondary | ICD-10-CM | POA: Diagnosis not present

## 2022-03-21 DIAGNOSIS — Z0001 Encounter for general adult medical examination with abnormal findings: Secondary | ICD-10-CM

## 2022-03-21 MED ORDER — HYDROCODONE-ACETAMINOPHEN 10-325 MG PO TABS: 1.0000 | ORAL_TABLET | Freq: Two times a day (BID) | ORAL | 0 refills | Status: DC

## 2022-03-21 NOTE — Assessment & Plan Note (Signed)
Encouraged diet and exercise for weight loss ?

## 2022-03-21 NOTE — Progress Notes (Signed)
Subjective:    Patient ID: Belinda Calderon, female    DOB: 07/17/1960, 61 y.o.   MRN: [id number]  HPI  Patient presents to clinic today for her annual exam.  Flu: Never Tetanus: 08/2011 COVID: Never Shingrix: Never Pap smear: Hysterectomy Mammogram: 02/2012 Bone density: 02/2012 Colon screening: 09/2019, Cologuard Vision screening: as needed Dentist: as needed  Diet: She does eat meat. She consumes fruits and veggies. She tries to avoid fried foods. She drinks mostly water. Exercise: None  Review of Systems     Past Medical History:  Diagnosis Date   Ankylosing spondylitis (HCC)    Degenerative disc disease    Fibromyalgia    Hypertension    IBS (irritable bowel syndrome)     Current Outpatient Medications  Medication Sig Dispense Refill   amLODipine (NORVASC) 5 MG tablet TAKE 1 TABLET BY MOUTH EVERY DAY 90 tablet 0   cholecalciferol (VITAMIN D3) 25 MCG (1000 UNIT) tablet Take 1,000 Units by mouth daily.     HYDROcodone-acetaminophen (NORCO) 10-325 MG tablet Take 1 tablet by mouth 2 (two) times daily. 60 tablet 0   lisinopril-hydrochlorothiazide (ZESTORETIC) 20-12.5 MG tablet TAKE 2 TABLETS BY MOUTH DAILY 180 tablet 0   No current facility-administered medications for this visit.    Allergies  Allergen Reactions   Sulfa Antibiotics Hives and Swelling   Ibuprofen     GI IRRITATION    Family History  Problem Relation Age of Onset   Diabetes Mother    Arthritis Mother    Arthritis Father    Hyperlipidemia Father    Stroke Father    Hypertension Father    Diabetes Father    Arthritis Maternal Grandmother    Arthritis Maternal Grandfather    Arthritis Paternal Grandmother    Heart disease Paternal Grandmother    Stroke Paternal Grandmother    Hypertension Paternal Grandmother    Diabetes Paternal Grandmother    Arthritis Paternal Grandfather    Hypertension Paternal Grandfather    Diabetes Paternal Grandfather     Social History   Socioeconomic  History   Marital status: Married    Spouse name: Nikeya Maxim   Number of children: 3   Years of education: Not on file   Highest education level: Not on file  Occupational History   Occupation: Disable  Tobacco Use   Smoking status: Never   Smokeless tobacco: Never  Vaping Use   Vaping Use: Some days   Substances: Mixture of cannabinoids  Substance and Sexual Activity   Alcohol use: Yes    Comment: rare   Drug use: Yes    Types: Marijuana    Comment: Cannabis oil   Sexual activity: Yes  Other Topics Concern   Not on file  Social History Narrative   Not on file   Social Determinants of Health   Financial Resource Strain: Low Risk  (01/24/2022)   Overall Financial Resource Strain (CARDIA)    Difficulty of Paying Living Expenses: Not hard at all  Food Insecurity: No Food Insecurity (01/24/2022)   Hunger Vital Sign    Worried About Running Out of Food in the Last Year: Never true    Ran Out of Food in the Last Year: Never true  Transportation Needs: No Transportation Needs (01/24/2022)   PRAPARE - Hydrologist (Medical): No    Lack of Transportation (Non-Medical): No  Physical Activity: Insufficiently Active (01/24/2022)   Exercise Vital Sign    Days of Exercise per Week:  4 days    Minutes of Exercise per Session: 30 min  Stress: Stress Concern Present (01/24/2022)   [place]    Feeling of Stress : Very much  Social Connections: Moderately Isolated (01/24/2022)   Social Connection and Isolation Panel [NHANES]    Frequency of Communication with Friends and Family: More than three times a week    Frequency of Social Gatherings with Friends and Family: More than three times a week    Attends Religious Services: Never    Marine scientist or Organizations: No    Attends Archivist Meetings: Never    Marital Status: Married  Human resources officer Violence: Not At Risk  (01/24/2022)   Humiliation, Afraid, Rape, and Kick questionnaire    Fear of Current or Ex-Partner: No    Emotionally Abused: No    Physically Abused: No    Sexually Abused: No     Constitutional: Denies fever, malaise, fatigue, headache or abrupt weight changes.  HEENT: Denies eye pain, eye redness, ear pain, ringing in the ears, wax buildup, runny nose, nasal congestion, bloody nose, or sore throat. Respiratory: Denies difficulty breathing, shortness of breath, cough or sputum production.   Cardiovascular: Denies chest pain, chest tightness, palpitations or swelling in the hands or feet.  Gastrointestinal: Denies abdominal pain, bloating, constipation, diarrhea or blood in the stool.  GU: Denies urgency, frequency, pain with urination, burning sensation, blood in urine, odor or discharge. Musculoskeletal: Patient reports chronic joint muscle pain.  Denies decrease in range of motion, difficulty with gait, or joint swelling.  Skin: Denies redness, rashes, lesions or ulcercations.  Neurological: Patient reports insomnia.  Denies dizziness, difficulty with memory, difficulty with speech or problems with balance and coordination.  Psych: Patient has a history of anxiety and depression.  Denies SI/HI.  No other specific complaints in a complete review of systems (except as listed in HPI above).  Objective:   Physical Exam  BP 134/80 (BP Location: Left Arm, Patient Position: Sitting, Cuff Size: Large)   Pulse 87   Temp (!) 97.1 F (36.2 C) (Temporal)   Ht _0  (1.702 m)   Wt 207 lb (93.9 kg)   LMP  (LMP Unknown)   SpO2 99%   BMI 32.42 kg/m   Wt Readings from Last 3 Encounters:  01/24/22 209 lb (94.8 kg)  01/16/22 209 lb (94.8 kg)  12/11/21 209 lb (94.8 kg)    General: Appears her stated age, obese, in NAD. Skin: Warm, dry and intact.  HEENT: Head: normal shape and size; Eyes: sclera white, no icterus, conjunctiva pink, PERRLA and EOMs intact;  Neck:  Neck supple, trachea  midline. No masses, lumps or thyromegaly present.  Cardiovascular: Normal rate and rhythm. S1,S2 noted.  No murmur, rubs or gallops noted. No JVD or BLE edema. No carotid bruits noted. Pulmonary/Chest: Normal effort and positive vesicular breath sounds. No respiratory distress. No wheezes, rales or ronchi noted.  Abdomen: Normal bowel sounds.  Musculoskeletal: Strength 5/5 BUE/BLE. No difficulty with gait.  Neurological: Alert and oriented. Cranial nerves II-XII grossly intact. Coordination normal.  Psychiatric: Mood and affect flat. Behavior is normal. Judgment and thought content normal.    BMET    Component Value Date/Time   NA 138 08/24/2021 1456   K 3.4 (L) 08/24/2021 1456   CL 103 08/24/2021 1456   CO2 27 08/24/2021 1456   GLUCOSE 120 08/24/2021 1456   BUN 18 08/24/2021 1456   CREATININE 0.63  08/24/2021 1456   CALCIUM 9.4 08/24/2021 1456   GFRNONAA 96 12/11/2020 0844   GFRAA 112 12/11/2020 0844    Lipid Panel     Component Value Date/Time   CHOL 173 08/24/2021 1456   TRIG 299 (H) 08/24/2021 1456   HDL 31 (L) 08/24/2021 1456   CHOLHDL 5.6 (H) 08/24/2021 1456   VLDL 63.0 (H) 12/13/2019 1254   LDLCALC 102 (H) 08/24/2021 1456    CBC    Component Value Date/Time   WBC 10.3 08/24/2021 1456   RBC 4.35 08/24/2021 1456   HGB 13.4 08/24/2021 1456   HCT 39.2 08/24/2021 1456   PLT 278 08/24/2021 1456   MCV 90.1 08/24/2021 1456   MCH 30.8 08/24/2021 1456   MCHC 34.2 08/24/2021 1456   RDW 12.7 08/24/2021 1456   LYMPHSABS 2.7 09/09/2011 2110   MONOABS 0.9 09/09/2011 2110   EOSABS 1.0 (H) 09/09/2011 2110   BASOSABS 0.0 09/09/2011 2110    Hgb A1C Lab Results  Component Value Date   HGBA1C 5.3 12/11/2020           Assessment & Plan:   Preventative Health Maintenance:  She declines flu shot She declines tetanus booster Encouraged her to get her COVID-vaccine Discussed Shingrix vaccine, she will check coverage with her insurance company and schedule a nurse  visit if she would like to have this done She no longer needs Pap smears She declines mammogram and bone density Colon screening UTD Encouraged her to consume a balanced diet and exercise regimen Advised her to see an eye doctor and dentist annually We will check CBC, c-Met and lipid profile today  RTC in 6 months, follow-up chronic conditions Webb Silversmith, NP

## 2022-03-21 NOTE — Patient Instructions (Signed)
Health Maintenance for Postmenopausal Women Menopause is a normal process in which your ability to get pregnant comes to an end. This process happens slowly over many months or years, usually between the ages of 48 and 55. Menopause is complete when you have missed your menstrual period for 12 months. It is important to talk with your health care provider about some of the most common conditions that affect women after menopause (postmenopausal women). These include heart disease, cancer, and bone loss (osteoporosis). Adopting a healthy lifestyle and getting preventive care can help to promote your health and wellness. The actions you take can also lower your chances of developing some of these common conditions. What are the signs and symptoms of menopause? During menopause, you may have the following symptoms: Hot flashes. These can be moderate or severe. Night sweats. Decrease in sex drive. Mood swings. Headaches. Tiredness (fatigue). Irritability. Memory problems. Problems falling asleep or staying asleep. Talk with your health care provider about treatment options for your symptoms. Do I need hormone replacement therapy? Hormone replacement therapy is effective in treating symptoms that are caused by menopause, such as hot flashes and night sweats. Hormone replacement carries certain risks, especially as you become older. If you are thinking about using estrogen or estrogen with progestin, discuss the benefits and risks with your health care provider. How can I reduce my risk for heart disease and stroke? The risk of heart disease, heart attack, and stroke increases as you age. One of the causes may be a change in the body's hormones during menopause. This can affect how your body uses dietary fats, triglycerides, and cholesterol. Heart attack and stroke are medical emergencies. There are many things that you can do to help prevent heart disease and stroke. Watch your blood pressure High  blood pressure causes heart disease and increases the risk of stroke. This is more likely to develop in people who have high blood pressure readings or are overweight. Have your blood pressure checked: Every 3-5 years if you are 18-39 years of age. Every year if you are 40 years old or older. Eat a healthy diet  Eat a diet that includes plenty of vegetables, fruits, low-fat dairy products, and lean protein. Do not eat a lot of foods that are high in solid fats, added sugars, or sodium. Get regular exercise Get regular exercise. This is one of the most important things you can do for your health. Most adults should: Try to exercise for at least 150 minutes each week. The exercise should increase your heart rate and make you sweat (moderate-intensity exercise). Try to do strengthening exercises at least twice each week. Do these in addition to the moderate-intensity exercise. Spend less time sitting. Even light physical activity can be beneficial. Other tips Work with your health care provider to achieve or maintain a healthy weight. Do not use any products that contain nicotine or tobacco. These products include cigarettes, chewing tobacco, and vaping devices, such as e-cigarettes. If you need help quitting, ask your health care provider. Know your numbers. Ask your health care provider to check your cholesterol and your blood sugar (glucose). Continue to have your blood tested as directed by your health care provider. Do I need screening for cancer? Depending on your health history and family history, you may need to have cancer screenings at different stages of your life. This may include screening for: Breast cancer. Cervical cancer. Lung cancer. Colorectal cancer. What is my risk for osteoporosis? After menopause, you may be   at increased risk for osteoporosis. Osteoporosis is a condition in which bone destruction happens more quickly than new bone creation. To help prevent osteoporosis or  the bone fractures that can happen because of osteoporosis, you may take the following actions: If you are 19-50 years old, get at least 1,000 mg of calcium and at least 600 international units (IU) of vitamin D per day. If you are older than age 50 but younger than age 70, get at least 1,200 mg of calcium and at least 600 international units (IU) of vitamin D per day. If you are older than age 70, get at least 1,200 mg of calcium and at least 800 international units (IU) of vitamin D per day. Smoking and drinking excessive alcohol increase the risk of osteoporosis. Eat foods that are rich in calcium and vitamin D, and do weight-bearing exercises several times each week as directed by your health care provider. How does menopause affect my mental health? Depression may occur at any age, but it is more common as you become older. Common symptoms of depression include: Feeling depressed. Changes in sleep patterns. Changes in appetite or eating patterns. Feeling an overall lack of motivation or enjoyment of activities that you previously enjoyed. Frequent crying spells. Talk with your health care provider if you think that you are experiencing any of these symptoms. General instructions See your health care provider for regular wellness exams and vaccines. This may include: Scheduling regular health, dental, and eye exams. Getting and maintaining your vaccines. These include: Influenza vaccine. Get this vaccine each year before the flu season begins. Pneumonia vaccine. Shingles vaccine. Tetanus, diphtheria, and pertussis (Tdap) booster vaccine. Your health care provider may also recommend other immunizations. Tell your health care provider if you have ever been abused or do not feel safe at home. Summary Menopause is a normal process in which your ability to get pregnant comes to an end. This condition causes hot flashes, night sweats, decreased interest in sex, mood swings, headaches, or lack  of sleep. Treatment for this condition may include hormone replacement therapy. Take actions to keep yourself healthy, including exercising regularly, eating a healthy diet, watching your weight, and checking your blood pressure and blood sugar levels. Get screened for cancer and depression. Make sure that you are up to date with all your vaccines. This information is not intended to replace advice given to you by your health care provider. Make sure you discuss any questions you have with your health care provider. Document Revised: 10/23/2020 Document Reviewed: 10/23/2020 Elsevier Patient Education  2023 Elsevier Inc.  

## 2022-03-22 LAB — COMPLETE METABOLIC PANEL WITH GFR
AG Ratio: 1.6 (calc) (ref 1.0–2.5)
ALT: 15 U/L (ref 6–29)
AST: 18 U/L (ref 10–35)
Albumin: 4.4 g/dL (ref 3.6–5.1)
Alkaline phosphatase (APISO): 78 U/L (ref 37–153)
BUN: 17 mg/dL (ref 7–25)
CO2: 24 mmol/L (ref 20–32)
Calcium: 9.5 mg/dL (ref 8.6–10.4)
Chloride: 106 mmol/L (ref 98–110)
Creat: 0.73 mg/dL (ref 0.50–1.05)
Globulin: 2.7 g/dL (calc) (ref 1.9–3.7)
Glucose, Bld: 97 mg/dL (ref 65–99)
Potassium: 3.9 mmol/L (ref 3.5–5.3)
Sodium: 140 mmol/L (ref 135–146)
Total Bilirubin: 0.6 mg/dL (ref 0.2–1.2)
Total Protein: 7.1 g/dL (ref 6.1–8.1)
eGFR: 94 mL/min/{1.73_m2} (ref >=60)

## 2022-03-22 LAB — CBC
HCT: 42.7 % (ref 35.0–45.0)
Hemoglobin: 14.2 g/dL (ref 11.7–15.5)
MCH: 30.8 pg (ref 27.0–33.0)
MCHC: 33.3 g/dL (ref 32.0–36.0)
MCV: 92.6 fL (ref 80.0–100.0)
MPV: 10.3 fL (ref 7.5–12.5)
Platelets: 278 10*3/uL (ref 140–400)
RBC: 4.61 10*6/uL (ref 3.80–5.10)
RDW: 12.8 % (ref 11.0–15.0)
WBC: 7.1 10*3/uL (ref 3.8–10.8)

## 2022-03-22 LAB — LIPID PANEL
Cholesterol: 171 mg/dL (ref <200)
HDL: 39 mg/dL — ABNORMAL LOW (ref >=50)
LDL Cholesterol (Calc): 105 mg/dL (calc) — ABNORMAL HIGH
Non-HDL Cholesterol (Calc): 132 mg/dL (calc) — ABNORMAL HIGH (ref <130)
Total CHOL/HDL Ratio: 4.4 (calc) (ref <5.0)
Triglycerides: 152 mg/dL — ABNORMAL HIGH (ref <150)

## 2022-03-25 ENCOUNTER — Telehealth: Payer: Self-pay | Admitting: Internal Medicine

## 2022-03-25 MED ORDER — HYDROCODONE-ACETAMINOPHEN 10-325 MG PO TABS: 1.0000 | ORAL_TABLET | Freq: Two times a day (BID) | ORAL | 0 refills | Status: DC

## 2022-03-25 NOTE — Telephone Encounter (Signed)
Pt requests that her Rx be transferred to another pharmacy   Medication Refill - Medication: HYDROcodone-acetaminophen (NORCO) 10-325 MG tablet  Has the patient contacted their pharmacy? Yes.    Preferred Pharmacy (with phone number or street name):  [facility] - [place], [place] Phone:  [phone]  Fax:  [phone]     Has the patient been seen for an appointment in the last year OR does the patient have an upcoming appointment? Yes.    Agent: Please be advised that RX refills may take up to 3 business days. We ask that you follow-up with your pharmacy.

## 2022-04-11 ENCOUNTER — Telehealth: Payer: Self-pay

## 2022-04-11 NOTE — Telephone Encounter (Signed)
Copied from [place] [id number]. Topic: General - Other >> Apr 11, 2022  1:36 PM [place] B wrote: Reason for CRM: Patient called in states needs letter stating why she can't do jury duty because of her medical conditions.

## 2022-04-11 NOTE — Telephone Encounter (Signed)
I need the jury duty letter

## 2022-04-11 NOTE — Telephone Encounter (Signed)
Pt advised. She is going to scan a copy and send it through My Chart   Thanks,   -Mickel Baas

## 2022-04-13 ENCOUNTER — Other Ambulatory Visit: Payer: Self-pay | Admitting: Internal Medicine

## 2022-04-15 ENCOUNTER — Ambulatory Visit (INDEPENDENT_AMBULATORY_CARE_PROVIDER_SITE_OTHER): Payer: No Typology Code available for payment source | Admitting: Internal Medicine

## 2022-04-15 ENCOUNTER — Encounter: Payer: Self-pay | Admitting: Internal Medicine

## 2022-04-15 VITALS — BP 128/84 | HR 75 | Temp 96.2°F | Wt 212.0 lb

## 2022-04-15 DIAGNOSIS — M7662 Achilles tendinitis, left leg: Secondary | ICD-10-CM | POA: Diagnosis not present

## 2022-04-15 DIAGNOSIS — E6609 Other obesity due to excess calories: Secondary | ICD-10-CM | POA: Diagnosis not present

## 2022-04-15 DIAGNOSIS — Z6833 Body mass index (BMI) 33.0-33.9, adult: Secondary | ICD-10-CM

## 2022-04-15 DIAGNOSIS — Z0289 Encounter for other administrative examinations: Secondary | ICD-10-CM

## 2022-04-15 MED ORDER — PREDNISONE 10 MG PO TABS: ORAL_TABLET | ORAL | 0 refills | Status: DC

## 2022-04-15 NOTE — Assessment & Plan Note (Signed)
Encourage diet and exercise for weight loss 

## 2022-04-15 NOTE — Patient Instructions (Signed)
Achilles Tendinitis  Achilles tendinitis is inflammation of the tough, cord-like band that attaches the lower leg muscles to the heel bone (Achilles tendon). This is usually caused by overusing the tendon and the ankle joint. Achilles tendinitis usually gets better over time with treatment and caring for yourself at home. It can take weeks or months to heal completely. What are the causes? This condition may be caused by: A sudden increase in exercise or activity, such as running. Doing the same exercises or activities, such as jumping, over and over. Not warming up calf muscles before exercising. Exercising in shoes that are worn out or not made for exercise. Having arthritis or a bone growth (spur) on the back of the heel bone. This can rub against the tendon and hurt it. Age-related wear and tear. Tendons become less flexible with age and are more likely to be injured. What are the signs or symptoms? Common symptoms of this condition include: Pain in the Achilles tendon or in the back of the leg, just above the heel. The pain usually gets worse with exercise. Stiffness or soreness in the back of the leg, especially in the morning. Swelling of the skin over the Achilles tendon. Thickening of the tendon. Trouble standing on tiptoe. How is this diagnosed? This condition is diagnosed based on your symptoms and a physical exam. You may have tests, including: X-rays. MRI. How is this treated? The goal of treatment is to relieve symptoms and help your injury heal. Treatment may include: Decreasing or stopping activities that caused the tendinitis. This may mean switching to low-impact exercises like biking or swimming. Icing the injured area. Doing physical therapy, including strengthening and stretching exercises. Taking NSAIDs, such as ibuprofen, to help relieve pain and swelling. Using supportive shoes, wraps, heel lifts, or a walking boot (air cast). Having surgery. This may be done if  your symptoms do not improve after other treatments. Using high-energy shock wave impulses to stimulate the healing process (extracorporeal shock wave therapy). This is rare. Having an injection of medicines that help relieve inflammation (corticosteroids). This is rare. Follow these instructions at home: If you have an air cast: Wear the air cast as told by your health care provider. Remove it only as told by your health care provider. Loosen it if your toes tingle, become numb, or turn cold and blue. Keep it clean. If the air cast is not waterproof: Do not let it get wet. Cover it with a watertight covering when you take a bath or shower. Managing pain, stiffness, and swelling  If directed, put ice on the injured area. To do this: If you have a removable air cast, remove it as told by your health care provider. Put ice in a plastic bag. Place a towel between your skin and the bag. Leave the ice on for 20 minutes, 2-3 times a day. Move your toes often to reduce stiffness and swelling. Raise (elevate) your foot above the level of your heart while you are sitting or lying down. Activity Gradually return to your normal activities as told by your health care provider. Ask your health care provider what activities are safe for you. Do not do activities that cause pain. Consider doing low-impact exercises, like cycling or swimming. Ask your health care provider when it is safe to drive if you have an air cast on your foot. If physical therapy was prescribed, do exercises as told by your health care provider or physical therapist. General instructions If directed, wrap your   foot with an elastic bandage or other wrap. This can help to keep your tendon from moving too much while it heals. Your health care provider will show you how to wrap your foot correctly. Wear supportive shoes or heel lifts only as told by your health care provider. Take over-the-counter and prescription medicines only as  told by your health care provider. Keep all follow-up visits as told by your health care provider. This is important. Contact a health care provider if you: Have symptoms that get worse. Have pain that does not get better with medicine. Develop new, unexplained symptoms. Develop warmth and swelling in your foot. Have a fever. Get help right away if you: Have a sudden popping sound or sensation in your Achilles tendon followed by severe pain. Cannot move your toes or foot. Cannot put any weight on your foot. Your foot or toes become numb and look white or blue even after loosening your bandage or air cast. Summary Achilles tendinitis is inflammation of the tough, cord-like band that attaches the lower leg muscles to the heel bone (Achilles tendon). This condition is usually caused by overusing the tendon and the ankle joint. It can also be caused by arthritis or normal aging. The most common symptoms of this condition include pain, swelling, or stiffness in the Achilles tendon or in the back of the leg. This condition is usually treated by decreasing or stopping activities that caused the tendinitis, icing the injured area, taking NSAIDs, and doing physical therapy. This information is not intended to replace advice given to you by your health care provider. Make sure you discuss any questions you have with your health care provider. Document Revised: 10/19/2018 Document Reviewed: 10/19/2018 Elsevier Patient Education  2023 Elsevier Inc.  

## 2022-04-15 NOTE — Progress Notes (Signed)
Subjective:    Patient ID: Belinda Calderon, female    DOB: 12/30/1960, 61 y.o.   MRN: [id number]  HPI  Patient presents to clinic today with complaint of left Achilles pain.  This has been an ongoing issue.  She describes the pain as sharp and throbbing.  The pain radiates up into her calf.  She saw podiatry 12/2021 for the same.  She received a steroid injection in the left Achilles.  She reports the pain has consistently gotten worse since that time.  She reports associated swelling.  She has not noticed any redness or warmth.  Of note, she also needs a note for jury duty excuse.  Review of Systems     Past Medical History:  Diagnosis Date   Ankylosing spondylitis (HCC)    Degenerative disc disease    Fibromyalgia    Hypertension    IBS (irritable bowel syndrome)     Current Outpatient Medications  Medication Sig Dispense Refill   amLODipine (NORVASC) 5 MG tablet TAKE 1 TABLET BY MOUTH EVERY DAY 90 tablet 0   cholecalciferol (VITAMIN D3) 25 MCG (1000 UNIT) tablet Take 1,000 Units by mouth daily.     HYDROcodone-acetaminophen (NORCO) 10-325 MG tablet Take 1 tablet by mouth 2 (two) times daily. 60 tablet 0   lisinopril-hydrochlorothiazide (ZESTORETIC) 20-12.5 MG tablet TAKE 2 TABLETS BY MOUTH DAILY 180 tablet 0   No current facility-administered medications for this visit.    Allergies  Allergen Reactions   Sulfa Antibiotics Hives and Swelling   Ibuprofen     GI IRRITATION    Family History  Problem Relation Age of Onset   Diabetes Mother    Arthritis Mother    Arthritis Father    Hyperlipidemia Father    Stroke Father    Hypertension Father    Diabetes Father    Arthritis Maternal Grandmother    Arthritis Maternal Grandfather    Arthritis Paternal Grandmother    Heart disease Paternal Grandmother    Stroke Paternal Grandmother    Hypertension Paternal Grandmother    Diabetes Paternal Grandmother    Arthritis Paternal Grandfather    Hypertension Paternal  Grandfather    Diabetes Paternal Grandfather     Social History   Socioeconomic History   Marital status: Married    Spouse name: Retha Bither   Number of children: 3   Years of education: Not on file   Highest education level: Not on file  Occupational History   Occupation: Disable  Tobacco Use   Smoking status: Never   Smokeless tobacco: Never  Vaping Use   Vaping Use: Some days   Substances: Mixture of cannabinoids  Substance and Sexual Activity   Alcohol use: Yes    Comment: rare   Drug use: Yes    Types: Marijuana    Comment: Cannabis oil   Sexual activity: Yes  Other Topics Concern   Not on file  Social History Narrative   Not on file   Social Determinants of Health   Financial Resource Strain: Low Risk  (01/24/2022)   Overall Financial Resource Strain (CARDIA)    Difficulty of Paying Living Expenses: Not hard at all  Food Insecurity: No Food Insecurity (01/24/2022)   Hunger Vital Sign    Worried About Running Out of Food in the Last Year: Never true    Ran Out of Food in the Last Year: Never true  Transportation Needs: No Transportation Needs (01/24/2022)   PRAPARE - Transportation    Lack  of Transportation (Medical): No    Lack of Transportation (Non-Medical): No  Physical Activity: Insufficiently Active (01/24/2022)   Exercise Vital Sign    Days of Exercise per Week: 4 days    Minutes of Exercise per Session: 30 min  Stress: Stress Concern Present (01/24/2022)   [place]    Feeling of Stress : Very much  Social Connections: Moderately Isolated (01/24/2022)   Social Connection and Isolation Panel [NHANES]    Frequency of Communication with Friends and Family: More than three times a week    Frequency of Social Gatherings with Friends and Family: More than three times a week    Attends Religious Services: Never    Marine scientist or Organizations: No    Attends Archivist  Meetings: Never    Marital Status: Married  Human resources officer Violence: Not At Risk (01/24/2022)   Humiliation, Afraid, Rape, and Kick questionnaire    Fear of Current or Ex-Partner: No    Emotionally Abused: No    Physically Abused: No    Sexually Abused: No     Constitutional: Denies fever, malaise, fatigue, headache or abrupt weight changes.  Respiratory: Denies difficulty breathing, shortness of breath, cough or sputum production.   Cardiovascular: Denies chest pain, chest tightness, palpitations or swelling in the hands or feet.  Musculoskeletal: Patient reports left Achilles pain and swelling.  Denies decrease in range of motion, difficulty with gait, muscle pain or joint pain.  Skin: Denies redness, rashes, lesions or ulcercations.   No other specific complaints in a complete review of systems (except as listed in HPI above).  Objective:   Physical Exam  Webb Silversmith, NP  Wt Readings from Last 3 Encounters:  03/21/22 207 lb (93.9 kg)  01/24/22 209 lb (94.8 kg)  01/16/22 209 lb (94.8 kg)    General: Appears her stated age, obese, in NAD. Skin: Warm, dry and intact. No redness noted. Cardiovascular: Normal rate and rhythm.  Pulmonary/Chest: Normal effort and positive vesicular breath sounds. No respiratory distress. No wheezes, rales or ronchi noted.  Musculoskeletal: Swelling noted over the left Achilles.  Pain with palpation of the left Achilles.  Limping gait. Neurological: Alert and oriented.   BMET    Component Value Date/Time   NA 140 03/21/2022 0928   K 3.9 03/21/2022 0928   CL 106 03/21/2022 0928   CO2 24 03/21/2022 0928   GLUCOSE 97 03/21/2022 0928   BUN 17 03/21/2022 0928   CREATININE 0.73 03/21/2022 0928   CALCIUM 9.5 03/21/2022 0928   GFRNONAA 96 12/11/2020 0844   GFRAA 112 12/11/2020 0844    Lipid Panel     Component Value Date/Time   CHOL 171 03/21/2022 0928   TRIG 152 (H) 03/21/2022 0928   HDL 39 (L) 03/21/2022 0928   CHOLHDL 4.4 03/21/2022  0928   VLDL 63.0 (H) 12/13/2019 1254   LDLCALC 105 (H) 03/21/2022 0928    CBC    Component Value Date/Time   WBC 7.1 03/21/2022 0928   RBC 4.61 03/21/2022 0928   HGB 14.2 03/21/2022 0928   HCT 42.7 03/21/2022 0928   PLT 278 03/21/2022 0928   MCV 92.6 03/21/2022 0928   MCH 30.8 03/21/2022 0928   MCHC 33.3 03/21/2022 0928   RDW 12.8 03/21/2022 0928   LYMPHSABS 2.7 09/09/2011 2110   MONOABS 0.9 09/09/2011 2110   EOSABS 1.0 (H) 09/09/2011 2110   BASOSABS 0.0 09/09/2011 2110    Hgb A1C  Lab Results  Component Value Date   HGBA1C 5.3 12/11/2020            Assessment & Plan:   Left Achilles Tendonitis:  Deteriorated Rx for Pred taper x9 days Advised her to wear her boot during the day for for the next 4 weeks We will have her follow-up with podiatry if symptoms persist or worsen  Encounter for Form Completion with Patient:  Madaline Savage duty excuse letter provided  RTC in 6 months for follow-up of chronic conditions Webb Silversmith, NP

## 2022-04-15 NOTE — Telephone Encounter (Signed)
Requested Prescriptions  Pending Prescriptions Disp Refills  . lisinopril-hydrochlorothiazide (ZESTORETIC) 20-12.5 MG tablet [Pharmacy Med Name: LISINOPRIL-HCTZ 20-12.5 MG TAB] 180 tablet 0    Sig: TAKE 2 TABLETS BY MOUTH EVERY DAY     Cardiovascular:  ACEI + Diuretic Combos Passed - 04/13/2022  8:38 AM      Passed - Na in normal range and within 180 days    Sodium  Date Value Ref Range Status  03/21/2022 140 135 - 146 mmol/L Final         Passed - K in normal range and within 180 days    Potassium  Date Value Ref Range Status  03/21/2022 3.9 3.5 - 5.3 mmol/L Final         Passed - Cr in normal range and within 180 days    Creat  Date Value Ref Range Status  03/21/2022 0.73 0.50 - 1.05 mg/dL Final         Passed - eGFR is 30 or above and within 180 days    GFR, Est African American  Date Value Ref Range Status  12/11/2020 112 > OR = 60 mL/min/1.26m Final   GFR, Est Non African American  Date Value Ref Range Status  12/11/2020 96 > OR = 60 mL/min/1.750mFinal   GFR  Date Value Ref Range Status  07/26/2020 93.23 >60.00 mL/min Final    Comment:    Calculated using the CKD-EPI Creatinine Equation (2021)   eGFR  Date Value Ref Range Status  03/21/2022 94 > OR = 60 mL/min/1.7373minal         Passed - Patient is not pregnant      Passed - Last BP in normal range    BP Readings from Last 1 Encounters:  04/15/22 128/84         Passed - Valid encounter within last 6 months    Recent Outpatient Visits          Today Achilles tendinitis of left lower extremity   [facility]   3 weeks ago Encounter for general adult medical examination with abnormal findings   [facility]   2 months [facility]   4 months ago Irritable bowel syndrome with both constipation and diarrhea   [facility]   6 months ago Irritable bowel syndrome with both constipation and diarrhea   [facility] [facility]             . amLODipine (NORVASC) 5 MG tablet [Pharmacy Med Name: AMLODIPINE BESYLATE 5 MG TAB] 90 tablet 0    Sig: TAKE 1 TABLET BY MOUTH EVERY DAY     Cardiovascular: Calcium Channel Blockers 2 Passed - 04/13/2022  8:38 AM      Passed - Last BP in normal range    BP Readings from Last 1 Encounters:  04/15/22 128/84         Passed - Last Heart Rate in normal range    Pulse Readings from Last 1 Encounters:  04/15/22 75         Passed - Valid encounter within last 6 months    Recent Outpatient Visits          Today Achilles tendinitis of left lower extremity   [facility]  3 weeks ago Encounter for general adult medical examination with abnormal findings   [facility] [place], Coralie Keens, NP   2 months ago Ankylosing spondylitis of lumbar region [facility]   [facility] [place], Coralie Keens, NP   4 months ago Irritable bowel syndrome with both constipation and diarrhea   [facility] [place], [place] W, NP   6 months ago Irritable bowel syndrome with both constipation and diarrhea   [facility] [place], Coralie Keens, [place]

## 2022-04-22 ENCOUNTER — Other Ambulatory Visit: Payer: Self-pay | Admitting: Internal Medicine

## 2022-04-22 NOTE — Telephone Encounter (Unsigned)
Copied from [place] [id number]. Topic: General - Other >> Apr 22, 2022  9:32 AM Everette C wrote: Reason for CRM: Medication Refill - Medication: HYDROcodone-acetaminophen (NORCO) 10-325 MG tablet [[id number]]  lisinopril-hydrochlorothiazide (ZESTORETIC) 20-12.5 MG tablet [[id number]]  amLODipine (NORVASC) 5 MG tablet [[id number]]  Has the patient contacted their pharmacy? No. (Agent: If no, request that the patient contact the pharmacy for the refill. If patient does not wish to contact the pharmacy document the reason why and proceed with request.) (Agent: If yes, when and what did the pharmacy advise?)  Preferred Pharmacy (with phone number or street name): [facility] - [place], [place] [address] Phone: [phone] Fax: [phone] Hours: Not open 24 hours   Has the patient been seen for an appointment in the last year OR does the patient have an upcoming appointment? Yes.    Agent: Please be advised that RX refills may take up to 3 business days. We ask that you follow-up with your pharmacy.

## 2022-04-23 ENCOUNTER — Telehealth: Payer: Self-pay | Admitting: Internal Medicine

## 2022-04-23 MED ORDER — HYDROCODONE-ACETAMINOPHEN 10-325 MG PO TABS: 1.0000 | ORAL_TABLET | Freq: Two times a day (BID) | ORAL | 0 refills | Status: DC

## 2022-04-23 NOTE — Telephone Encounter (Signed)
Medication refilled

## 2022-04-23 NOTE — Telephone Encounter (Signed)
Requested Prescriptions  Pending Prescriptions Disp Refills   amLODipine (NORVASC) 5 MG tablet 90 tablet 0    Sig: Take 1 tablet (5 mg total) by mouth daily.     Cardiovascular: Calcium Channel Blockers 2 Passed - 04/22/2022 11:20 AM      Passed - Last BP in normal range    BP Readings from Last 1 Encounters:  04/15/22 128/84         Passed - Last Heart Rate in normal range    Pulse Readings from Last 1 Encounters:  04/15/22 75         Passed - Valid encounter within last 6 months    Recent Outpatient Visits           1 week ago Achilles tendinitis of left lower extremity   [place], Coralie Keens, NP   1 month ago Encounter for general adult medical examination with abnormal findings   [facility] [place], Coralie Keens, NP   3 months ago Ankylosing spondylitis of lumbar region [facility])   [facility] [place], Coralie Keens, NP   4 months ago Irritable bowel syndrome with both constipation and diarrhea   [facility] [place], [place] W, NP   6 months ago Irritable bowel syndrome with both constipation and diarrhea   [facility] [place], [place] W, NP               lisinopril-hydrochlorothiazide (ZESTORETIC) 20-12.5 MG tablet 180 tablet 0    Sig: Take 2 tablets by mouth daily.     Cardiovascular:  ACEI + Diuretic Combos Passed - 04/22/2022 11:20 AM      Passed - Na in normal range and within 180 days    Sodium  Date Value Ref Range Status  03/21/2022 140 135 - 146 mmol/L Final         Passed - K in normal range and within 180 days    Potassium  Date Value Ref Range Status  03/21/2022 3.9 3.5 - 5.3 mmol/L Final         Passed - Cr in normal range and within 180 days    Creat  Date Value Ref Range Status  03/21/2022 0.73 0.50 - 1.05 mg/dL Final         Passed - eGFR is 30 or above and within 180 days    GFR, Est African American  Date Value Ref Range Status  12/11/2020 112 > OR = 60 mL/min/1.65m  Final   GFR, Est Non African American  Date Value Ref Range Status  12/11/2020 96 > OR = 60 mL/min/1.730mFinal   GFR  Date Value Ref Range Status  07/26/2020 93.23 >60.00 mL/min Final    Comment:    Calculated using the CKD-EPI Creatinine Equation (2021)   eGFR  Date Value Ref Range Status  03/21/2022 94 > OR = 60 mL/min/1.7318minal         Passed - Patient is not pregnant      Passed - Last BP in normal range    BP Readings from Last 1 Encounters:  04/15/22 128/84         Passed - Valid encounter within last 6 months    Recent Outpatient Visits           1 week ago Achilles tendinitis of left lower extremity   [place]   1 month ago Encounter for general adult medical  examination with abnormal findings   [facility] [place], [place], NP   3 months ago Ankylosing spondylitis of lumbar region [facility])   [facility] [place], Coralie Keens, NP   4 months ago Irritable bowel syndrome with both constipation and diarrhea   [facility] [place], [place] W, NP   6 months ago Irritable bowel syndrome with both constipation and diarrhea   [facility] [place], Coralie Keens, [place]

## 2022-04-23 NOTE — Telephone Encounter (Signed)
Call to patient- patient informed her BP medications [facility]- she will go pick them up. Patient states seh needs to get a RF on her pain medication- this cold weather is very hard on her joints and it is hard for her to move. Patient states she went without for 2 weeks last refill and she does not want to be in that position again. Patient would like her pain medication sent to [facility]- due to supply issue with pain medication. Patient advised I would send request to provider.

## 2022-04-23 NOTE — Telephone Encounter (Signed)
Copied from [place] [id number]. Topic: General - Other >> Apr 23, 2022  9:13 AM Everette C wrote: Reason for CRM: The patient has called to follow up on their previous request for their refills of    HYDROcodone-acetaminophen (NORCO) 10-325 MG tablet [[id number]]  lisinopril-hydrochlorothiazide (ZESTORETIC) 20-12.5 MG tablet [[id number]]  amLODipine (NORVASC) 5 MG tablet [[id number]]  The patient would like to speak directly with a member of clinical staff when possible   Please contact further when available

## 2022-04-30 ENCOUNTER — Telehealth: Payer: Self-pay

## 2022-04-30 NOTE — Telephone Encounter (Signed)
Copied from [place] [id number]. Topic: General - Other >> Apr 30, 2022  8:58 AM Everette C wrote: Reason for CRM: The patient has called for an update on their previously requested letter related to [employer] and their Smart Meter  The patient has been told that it will cost them roughly $150 to remove their smart meter without permission for a doctor   Please contact the patient further when possible

## 2022-04-30 NOTE — Telephone Encounter (Signed)
I will contact patient.

## 2022-05-01 ENCOUNTER — Encounter: Payer: Self-pay | Admitting: Internal Medicine

## 2022-05-23 ENCOUNTER — Other Ambulatory Visit: Payer: Self-pay | Admitting: Internal Medicine

## 2022-05-23 NOTE — Telephone Encounter (Signed)
Medication Refill - Medication:  HYDROcodone-acetaminophen (NORCO) 10-325 MG tablet  Has the patient contacted their pharmacy? No. (Agent: If no, request that the patient contact the pharmacy for the refill. If patient does not wish to contact the pharmacy document the reason why and proceed with request.) (Agent: If yes, when and what did the pharmacy advise?)  Preferred Pharmacy (with phone number or street name): [place]  Has the patient been seen for an appointment in the last year OR does the patient have an upcoming appointment?   Agent: Please be advised that RX refills may take up to 3 business days. We ask that you follow-up with your pharmacy.

## 2022-05-23 NOTE — Telephone Encounter (Signed)
Requested medication (s) are due for refill today: yes  Requested medication (s) are on the active medication list: yes  Last refill:  04/23/22 #60  Future visit scheduled: no  Notes to clinic:  med not delegated to NT to RF   Requested Prescriptions  Pending Prescriptions Disp Refills   HYDROcodone-acetaminophen ([place]) 10-325 MG tablet [Pharmacy Med Name: HYDROCODONE-APAP 10-325 MG TAB] 60 tablet     Sig: TAKE 1 TABLET BY MOUTH TWICE A DAY     Not Delegated - Analgesics:  Opioid Agonist Combinations Failed - 05/23/2022 11:21 AM      Failed - This refill cannot be delegated      Failed - Urine Drug Screen completed in last 360 days      Passed - Valid encounter within last 3 months    Recent Outpatient Visits           1 month ago Achilles tendinitis of left lower extremity   [facility] [place], Coralie Keens, NP   2 months ago Encounter for general adult medical examination with abnormal findings   [facility] [place], Coralie Keens, NP   4 months ago Ankylosing spondylitis of lumbar region [facility])   [facility] [place], Coralie Keens, NP   5 months ago Irritable bowel syndrome with both constipation and diarrhea   [facility] [place], [place] W, NP   7 months ago Irritable bowel syndrome with both constipation and diarrhea   [facility] [place], Coralie Keens, [place]

## 2022-05-23 NOTE — Telephone Encounter (Signed)
Requested medication (s) are due for refill today: yes  Requested medication (s) are on the active medication list: yes  Last refill:  04/23/22  Future visit scheduled: yes  Notes to clinic:  Unable to refill per protocol, cannot delegate.       Requested Prescriptions  Pending Prescriptions Disp Refills   HYDROcodone-acetaminophen (NORCO) 10-325 MG tablet 60 tablet 0    Sig: Take 1 tablet by mouth 2 (two) times daily.     Not Delegated - Analgesics:  Opioid Agonist Combinations Failed - 05/23/2022 12:03 PM      Failed - This refill cannot be delegated      Failed - Urine Drug Screen completed in last 360 days      Passed - Valid encounter within last 3 months    Recent Outpatient Visits           1 month ago Achilles tendinitis of left lower extremity   [place], Coralie Keens, NP   2 months ago Encounter for general adult medical examination with abnormal findings   [facility] [place], Coralie Keens, NP   4 months ago Ankylosing spondylitis of lumbar region [facility])   [facility] [place], Coralie Keens, NP   5 months ago Irritable bowel syndrome with both constipation and diarrhea   [facility] [place], [place] W, NP   7 months ago Irritable bowel syndrome with both constipation and diarrhea   [facility] [place], Coralie Keens, [place]

## 2022-06-24 ENCOUNTER — Other Ambulatory Visit: Payer: Self-pay | Admitting: Internal Medicine

## 2022-06-24 NOTE — Telephone Encounter (Unsigned)
Copied from [place] [id number]. Topic: General - Other >> Jun 24, 2022  8:40 AM Everette C wrote: Reason for CRM: Medication Refill - Medication: HYDROcodone-acetaminophen (NORCO) 10-325 MG tablet [[id number]]  Has the patient contacted their pharmacy? Yes.   (Agent: If no, request that the patient contact the pharmacy for the refill. If patient does not wish to contact the pharmacy document the reason why and proceed with request.) (Agent: If yes, when and what did the pharmacy advise?)  Preferred Pharmacy (with phone number or street name): [facility] - [place], [place] [address] Phone: [phone] Fax: [phone] Hours: Not open 24 hours   Has the patient been seen for an appointment in the last year OR does the patient have an upcoming appointment? Yes.    Agent: Please be advised that RX refills may take up to 3 business days. We ask that you follow-up with your pharmacy.

## 2022-06-25 MED ORDER — HYDROCODONE-ACETAMINOPHEN 10-325 MG PO TABS: 1.0000 | ORAL_TABLET | Freq: Two times a day (BID) | ORAL | 0 refills | Status: DC

## 2022-06-25 NOTE — Telephone Encounter (Signed)
Requested medication (s) are due for refill today: routing for approval  Requested medication (s) are on the active medication list: yes  Last refill:  05/23/22  Future visit scheduled: yes  Notes to clinic:  Unable to refill per protocol, cannot delegate.      Requested Prescriptions  Pending Prescriptions Disp Refills   HYDROcodone-acetaminophen (NORCO) 10-325 MG tablet 60 tablet 0    Sig: Take 1 tablet by mouth 2 (two) times daily.     Not Delegated - Analgesics:  Opioid Agonist Combinations Failed - 06/24/2022 10:15 AM      Failed - This refill cannot be delegated      Failed - Urine Drug Screen completed in last 360 days      Passed - Valid encounter within last 3 months    Recent Outpatient Visits           2 months ago Achilles tendinitis of left lower extremity   [place], Coralie Keens, NP   3 months ago Encounter for general adult medical examination with abnormal findings   [facility] [place], Coralie Keens, NP   5 months ago Ankylosing spondylitis of lumbar region [facility])   [facility] [place], Coralie Keens, NP   6 months ago Irritable bowel syndrome with both constipation and diarrhea   [facility] [place], [place] W, NP   8 months ago Irritable bowel syndrome with both constipation and diarrhea   [facility] [place], Coralie Keens, [place]

## 2022-07-19 ENCOUNTER — Other Ambulatory Visit: Payer: Self-pay | Admitting: Internal Medicine

## 2022-07-19 NOTE — Telephone Encounter (Signed)
Requested Prescriptions  Pending Prescriptions Disp Refills   amLODipine (NORVASC) 5 MG tablet [Pharmacy Med Name: AMLODIPINE BESYLATE 5 MG TAB] 90 tablet 0    Sig: TAKE 1 TABLET BY MOUTH EVERY DAY     Cardiovascular: Calcium Channel Blockers 2 Passed - 07/19/2022  1:29 AM      Passed - Last BP in normal range    BP Readings from Last 1 Encounters:  04/15/22 128/84         Passed - Last Heart Rate in normal range    Pulse Readings from Last 1 Encounters:  04/15/22 75         Passed - Valid encounter within last 6 months    Recent Outpatient Visits           3 months ago Achilles tendinitis of left lower extremity   [facility] [place], Coralie Keens, NP   4 months ago Encounter for general adult medical examination with abnormal findings   [facility] [place], Coralie Keens, NP   6 months ago Ankylosing spondylitis of lumbar region [facility])   [facility] [place], Coralie Keens, NP   7 months ago Irritable bowel syndrome with both constipation and diarrhea   [facility] [place], Coralie Keens, NP   9 months ago Irritable bowel syndrome with both constipation and diarrhea   [facility] [place], [place], NP               lisinopril-hydrochlorothiazide (ZESTORETIC) 20-12.5 MG tablet [Pharmacy Med Name: LISINOPRIL-HCTZ 20-12.5 MG TAB] 180 tablet 0    Sig: TAKE 2 TABLETS BY MOUTH EVERY DAY     Cardiovascular:  ACEI + Diuretic Combos Passed - 07/19/2022  1:29 AM      Passed - Na in normal range and within 180 days    Sodium  Date Value Ref Range Status  03/21/2022 140 135 - 146 mmol/L Final         Passed - K in normal range and within 180 days    Potassium  Date Value Ref Range Status  03/21/2022 3.9 3.5 - 5.3 mmol/L Final         Passed - Cr in normal range and within 180 days    Creat  Date Value Ref Range Status  03/21/2022 0.73 0.50 - 1.05 mg/dL Final          Passed - eGFR is 30 or above and within 180 days    GFR, Est African American  Date Value Ref Range Status  12/11/2020 112 > OR = 60 mL/min/1.79m Final   GFR, Est Non African American  Date Value Ref Range Status  12/11/2020 96 > OR = 60 mL/min/1.774mFinal   GFR  Date Value Ref Range Status  07/26/2020 93.23 >60.00 mL/min Final    Comment:    Calculated using the CKD-EPI Creatinine Equation (2021)   eGFR  Date Value Ref Range Status  03/21/2022 94 > OR = 60 mL/min/1.7312minal         Passed - Patient is not pregnant      Passed - Last BP in normal range    BP Readings from Last 1 Encounters:  04/15/22 128/84         Passed - Valid encounter within last 6 months    Recent Outpatient Visits           3 months ago  Achilles tendinitis of left lower extremity   [facility] [place], Coralie Keens, NP   4 months ago Encounter for general adult medical examination with abnormal findings   [facility] [place], Coralie Keens, NP   6 months ago Ankylosing spondylitis of lumbar region [facility])   [facility] [place], [place], NP   7 months ago Irritable bowel syndrome with both constipation and diarrhea   [facility] [place], [place] W, NP   9 months ago Irritable bowel syndrome with both constipation and diarrhea   [facility] [place], Coralie Keens, [place]

## 2022-07-23 ENCOUNTER — Other Ambulatory Visit: Payer: Self-pay | Admitting: Internal Medicine

## 2022-07-23 NOTE — Telephone Encounter (Signed)
Medication Refill - Medication: HYDROcodone-acetaminophen (NORCO) 10-325 MG tablet [[id number]]    Has the patient contacted their pharmacy? No. (Agent: If no, request that the patient contact the pharmacy for the refill. If patient does not wish to contact the pharmacy document the reason why and proceed with request.) (Agent: If yes, when and what did the pharmacy advise?)  Preferred Pharmacy (with phone number or street name): [place]  Has the patient been seen for an appointment in the last year OR does the patient have an upcoming appointment? Yes.    Agent: Please be advised that RX refills may take up to 3 business days. We ask that you follow-up with your pharmacy.

## 2022-07-24 NOTE — Telephone Encounter (Signed)
Requested medication (s) are due for refill today: yes  Requested medication (s) are on the active medication list: yes  Last refill:  06/25/22 #60 0 refills  Future visit scheduled: no  Notes to clinic:  not delegated per protocol do you want to refill Rx?     Requested Prescriptions  Pending Prescriptions Disp Refills   HYDROcodone-acetaminophen (NORCO) 10-325 MG tablet 60 tablet 0    Sig: Take 1 tablet by mouth 2 (two) times daily.     Not Delegated - Analgesics:  Opioid Agonist Combinations Failed - 07/23/2022  3:00 PM      Failed - This refill cannot be delegated      Failed - Urine Drug Screen completed in last 360 days      Failed - Valid encounter within last 3 months    Recent Outpatient Visits           3 months ago Achilles tendinitis of left lower extremity   [facility] [place], Coralie Keens, NP   4 months ago Encounter for general adult medical examination with abnormal findings   [facility] [place], Coralie Keens, NP   6 months ago Ankylosing spondylitis of lumbar region [facility])   [facility] [place], [place], NP   7 months ago Irritable bowel syndrome with both constipation and diarrhea   [facility] [place], [place], NP   9 months ago Irritable bowel syndrome with both constipation and diarrhea   [facility] [place], Coralie Keens, [place]

## 2022-07-25 MED ORDER — HYDROCODONE-ACETAMINOPHEN 10-325 MG PO TABS: 1.0000 | ORAL_TABLET | Freq: Two times a day (BID) | ORAL | 0 refills | Status: DC

## 2022-08-09 DIAGNOSIS — M7662 Achilles tendinitis, left leg: Secondary | ICD-10-CM | POA: Diagnosis not present

## 2022-08-09 DIAGNOSIS — M25572 Pain in left ankle and joints of left foot: Secondary | ICD-10-CM | POA: Diagnosis not present

## 2022-08-13 ENCOUNTER — Other Ambulatory Visit: Payer: No Typology Code available for payment source

## 2022-08-16 ENCOUNTER — Ambulatory Visit (INDEPENDENT_AMBULATORY_CARE_PROVIDER_SITE_OTHER): Payer: No Typology Code available for payment source | Admitting: Internal Medicine

## 2022-08-16 ENCOUNTER — Encounter: Payer: Self-pay | Admitting: Internal Medicine

## 2022-08-16 VITALS — BP 112/70 | HR 80 | Temp 96.9°F | Wt 210.0 lb

## 2022-08-16 DIAGNOSIS — M1711 Unilateral primary osteoarthritis, right knee: Secondary | ICD-10-CM | POA: Diagnosis not present

## 2022-08-16 DIAGNOSIS — M797 Fibromyalgia: Secondary | ICD-10-CM

## 2022-08-16 DIAGNOSIS — M456 Ankylosing spondylitis lumbar region: Secondary | ICD-10-CM

## 2022-08-16 NOTE — Patient Instructions (Signed)
Achilles Tendinitis  Achilles tendinitis is inflammation of the tough, cord-like band that connects the lower leg muscles to the heel bone (Achilles tendon). This is often caused by using the tendon and ankle joint too much. In most cases, Achilles tendinitis gets better over time with treatment and home care. It can take weeks or months to fully heal. What are the causes? This condition may be caused by: A sudden increase in exercise or activity, such as running. Doing the same exercises or activities, such as jumping, over and over. Not warming up your calf muscles before you exercise. Exercising in shoes that are worn out or not made for exercise. Having arthritis or a bone growth (spur) on the back of your heel. This can rub against the tendon and hurt it. Age-related wear and tear. Tendons become less flexible with age and are more likely to be injured. What are the signs or symptoms? Common symptoms of this condition include: Pain in your Achilles tendon or in the back of your leg, just above your heel. The pain may get worse when you exercise. Stiffness or soreness in the back of your leg. You may feel it most often in the morning. Swelling of the skin over the Achilles tendon. Thickening of the tendon. Trouble standing on tiptoe. How is this diagnosed? This condition is diagnosed based on your symptoms and a physical exam. You may also have tests, such as: X-rays. MRI. Ultrasound. How is this treated? The goal of treatment is to relieve symptoms and help your injury heal. You may need to: Decrease or stop activities that caused the tendinitis. You may be told to switch to low-impact exercises like biking or swimming. Ice the injured area. Do physical therapy. This may include strengthening and stretching exercises. Take NSAIDs, such as ibuprofen. These can help with pain and swelling. Use supportive shoes, wraps, heel lifts, or a walking boot (air cast). Have surgery. This may  be done if your symptoms do not get better with other treatments. Use high-energy waves to start the healing process (extracorporeal shock wave therapy). This is rare. Get an injection of medicines that help with inflammation (corticosteroids). This is rare. Follow these instructions at home: If you have an air cast: Wear the air cast as told by your health care provider. Remove it only as told by your provider. Check the skin around the air cast every day. Tell your provider about any concerns. Loosen the air cast if your toes tingle, become numb, or turn cold and blue. Keep the air cast clean. If the air cast is not waterproof: Do not let it get wet. Cover it with a watertight covering when you take a bath or shower. Managing pain, stiffness, and swelling  If told, put ice on the injured area. If you have a removable air cast, remove it as told by your provider. Put ice in a plastic bag. Place a towel between your skin and the bag. Leave the ice on for 20 minutes, 2-3 times a day. If your skin turns bright red, remove the ice right away to prevent skin damage. The risk of damage is higher if you cannot feel pain, heat, or cold. Move your toes often to reduce stiffness and swelling. Raise (elevate) your foot above the level of your heart while you are sitting or lying down. Activity Do not do activities that cause pain. Ask your provider when it is safe to drive if you have an air cast on your foot. If  you go to physical therapy, do exercises as told by your provider or therapist. Return to your normal activities as told by your provider. Ask your provider what activities are safe for you. General instructions Take over-the-counter and prescription medicines only as told by your provider. If told, wrap your foot with an elastic bandage or other wrap. This can help to keep your tendon from moving too much while it heals. Your provider will show you how to wrap your foot. Wear supportive  shoes or heel lifts only as told by your provider. Contact a health care provider if: Your symptoms get worse. Your pain does not get better with medicine. You have new symptoms that you cannot explain. You have warmth and swelling in your foot. You have a fever. Get help right away if: You hear a sudden popping sound in your Achilles tendon and then have severe pain. You cannot move your toes or foot. You cannot put any weight on your foot. Your foot or toes become numb and look white or blue even after you loosen your bandage or air cast. This information is not intended to replace advice given to you by your health care provider. Make sure you discuss any questions you have with your health care provider. Document Revised: 02/22/2022 Document Reviewed: 02/22/2022 Elsevier Patient Education  [employer].

## 2022-08-16 NOTE — Progress Notes (Signed)
Subjective:    Patient ID: Belinda Calderon, female    DOB: [birth year], 62 y.o.   MRN: [id number]  HPI  Patient presents to clinic today with complaint of left posterior foot pain.  This started at least 6 months ago.  She describes the pain as burning. The pain is worse with ambulation.  She has been diagnosed with left Achilles tendinitis recently.  She has seen [facility] 08/09/22 for the same. They recommend walking boot, then transition to silicone heel cups, Meloxicam, Voltaren Gel,  ice and PT. They recommended she follow up with them in 2 months. She reports she is unable to take Meloxicam and Voltaren gel is expensive and not effective. She reports her insurance will not cover physical therapy. She has a history of fibromyalgia and ankylosing spondylitis. The orthopedist wants labs work and wants her to follow up with rheumatology.  Review of Systems     Past Medical History:  Diagnosis Date   Ankylosing spondylitis (HCC)    Degenerative disc disease    Fibromyalgia    Hypertension    IBS (irritable bowel syndrome)     Current Outpatient Medications  Medication Sig Dispense Refill   amLODipine (NORVASC) 5 MG tablet TAKE 1 TABLET BY MOUTH EVERY DAY 90 tablet 0   cholecalciferol (VITAMIN D3) 25 MCG (1000 UNIT) tablet Take 1,000 Units by mouth daily.     HYDROcodone-acetaminophen (NORCO) 10-325 MG tablet Take 1 tablet by mouth 2 (two) times daily. 60 tablet 0   lisinopril-hydrochlorothiazide (ZESTORETIC) 20-12.5 MG tablet TAKE 2 TABLETS BY MOUTH EVERY DAY 180 tablet 0   predniSONE (DELTASONE) 10 MG tablet Take 3 tabs on days 1-3, 2 tabs on days 4-6, 1 tab on days 7-9 18 tablet 0   No current facility-administered medications for this visit.    Allergies  Allergen Reactions   Sulfa Antibiotics Hives and Swelling   Ibuprofen     GI IRRITATION    Family History  Problem Relation Age of Onset   Diabetes Mother    Arthritis Mother    Arthritis Father    Hyperlipidemia  Father    Stroke Father    Hypertension Father    Diabetes Father    Arthritis Maternal Grandmother    Arthritis Maternal Grandfather    Arthritis Paternal Grandmother    Heart disease Paternal Grandmother    Stroke Paternal Grandmother    Hypertension Paternal Grandmother    Diabetes Paternal Grandmother    Arthritis Paternal Grandfather    Hypertension Paternal Grandfather    Diabetes Paternal Grandfather     Social History   Socioeconomic History   Marital status: Married    Spouse name: Aisla Roblyer   Number of children: 3   Years of education: Not on file   Highest education level: Not on file  Occupational History   Occupation: Disable  Tobacco Use   Smoking status: Never   Smokeless tobacco: Never  Vaping Use   Vaping Use: Some days   Substances: Mixture of cannabinoids  Substance and Sexual Activity   Alcohol use: Yes    Comment: rare   Drug use: Yes    Types: Marijuana    Comment: Cannabis oil   Sexual activity: Yes  Other Topics Concern   Not on file  Social History Narrative   Not on file   Social Determinants of Health   Financial Resource Strain: Low Risk  (01/24/2022)   Overall Financial Resource Strain (CARDIA)    Difficulty of Paying  Living Expenses: Not hard at all  Food Insecurity: No Food Insecurity (01/24/2022)   Hunger Vital Sign    Worried About Running Out of Food in the Last Year: Never true    Ran Out of Food in the Last Year: Never true  Transportation Needs: No Transportation Needs (01/24/2022)   PRAPARE - Hydrologist (Medical): No    Lack of Transportation (Non-Medical): No  Physical Activity: Insufficiently Active (01/24/2022)   Exercise Vital Sign    Days of Exercise per Week: 4 days    Minutes of Exercise per Session: 30 min  Stress: Stress Concern Present (01/24/2022)   Edgar    Feeling of Stress : Very much  Social  Connections: Moderately Isolated (01/24/2022)   Social Connection and Isolation Panel [NHANES]    Frequency of Communication with Friends and Family: More than three times a week    Frequency of Social Gatherings with Friends and Family: More than three times a week    Attends Religious Services: Never    Marine scientist or Organizations: No    Attends Archivist Meetings: Never    Marital Status: Married  Human resources officer Violence: Not At Risk (01/24/2022)   Humiliation, Afraid, Rape, and Kick questionnaire    Fear of Current or Ex-Partner: No    Emotionally Abused: No    Physically Abused: No    Sexually Abused: No     Constitutional: Denies fever, malaise, fatigue, headache or abrupt weight changes.  Respiratory: Denies difficulty breathing, shortness of breath, cough or sputum production.   Cardiovascular: Denies chest pain, chest tightness, palpitations or swelling in the hands or feet.  Musculoskeletal: Patient reports left foot pain, chronic joint and muscle pain, difficulty with gait.  Denies decrease in range of motion, or joint swelling.  Skin: Denies redness, rashes, lesions or ulcercations.    No other specific complaints in a complete review of systems (except as listed in HPI above).  Objective:   Physical Exam  BP 112/70 (BP Location: Right Arm, Patient Position: Sitting, Cuff Size: Normal)   Pulse 80   Temp (!) 96.9 F (36.1 C) (Temporal)   Wt 210 lb (95.3 kg)   LMP  (LMP Unknown)   SpO2 99%   BMI 32.89 kg/m   Wt Readings from Last 3 Encounters:  04/15/22 212 lb (96.2 kg)  03/21/22 207 lb (93.9 kg)  01/24/22 209 lb (94.8 kg)    General: Appears her stated age, obese, in NAD. Skin: Warm, dry and intact.  Cardiovascular: Normal rate and rhythm. Pulmonary/Chest: Normal effort and positive vesicular breath sounds.  Musculoskeletal: Left foot in boot. Limping gait. Neurological: Alert and oriented.   BMET    Component Value Date/Time    NA 140 03/21/2022 0928   K 3.9 03/21/2022 0928   CL 106 03/21/2022 0928   CO2 24 03/21/2022 0928   GLUCOSE 97 03/21/2022 0928   BUN 17 03/21/2022 0928   CREATININE 0.73 03/21/2022 0928   CALCIUM 9.5 03/21/2022 0928   GFRNONAA 96 12/11/2020 0844   GFRAA 112 12/11/2020 0844    Lipid Panel     Component Value Date/Time   CHOL 171 03/21/2022 0928   TRIG 152 (H) 03/21/2022 0928   HDL 39 (L) 03/21/2022 0928   CHOLHDL 4.4 03/21/2022 0928   VLDL 63.0 (H) 12/13/2019 1254   LDLCALC 105 (H) 03/21/2022 0928    CBC    Component  Value Date/Time   WBC 7.1 03/21/2022 0928   RBC 4.61 03/21/2022 0928   HGB 14.2 03/21/2022 0928   HCT 42.7 03/21/2022 0928   PLT 278 03/21/2022 0928   MCV 92.6 03/21/2022 0928   MCH 30.8 03/21/2022 0928   MCHC 33.3 03/21/2022 0928   RDW 12.8 03/21/2022 0928   LYMPHSABS 2.7 09/09/2011 2110   MONOABS 0.9 09/09/2011 2110   EOSABS 1.0 (H) 09/09/2011 2110   BASOSABS 0.0 09/09/2011 2110    Hgb A1C Lab Results  Component Value Date   HGBA1C 5.3 12/11/2020           Assessment & Plan:  Left Achilles Tendonitis:  Continue treatment per orthopedics Continue to wear boot and silicone gel pads Labs ordered today, patient responsible for faxing this back to orthopedics  RTC in 8 weeks for follow-up of chronic conditions Webb Silversmith, NP

## 2022-08-19 DIAGNOSIS — M25512 Pain in left shoulder: Secondary | ICD-10-CM | POA: Diagnosis not present

## 2022-08-20 LAB — COMPLETE METABOLIC PANEL WITH GFR
AG Ratio: 1.7 (calc) (ref 1.0–2.5)
ALT: 22 U/L (ref 6–29)
AST: 19 U/L (ref 10–35)
Albumin: 4.4 g/dL (ref 3.6–5.1)
Alkaline phosphatase (APISO): 74 U/L (ref 37–153)
BUN: 17 mg/dL (ref 7–25)
CO2: 28 mmol/L (ref 20–32)
Calcium: 9.6 mg/dL (ref 8.6–10.4)
Chloride: 103 mmol/L (ref 98–110)
Creat: 0.7 mg/dL (ref 0.50–1.05)
Globulin: 2.6 g/dL (calc) (ref 1.9–3.7)
Glucose, Bld: 91 mg/dL (ref 65–99)
Potassium: 4 mmol/L (ref 3.5–5.3)
Sodium: 140 mmol/L (ref 135–146)
Total Bilirubin: 0.3 mg/dL (ref 0.2–1.2)
Total Protein: 7 g/dL (ref 6.1–8.1)
eGFR: 98 mL/min/{1.73_m2} (ref >=60)

## 2022-08-20 LAB — CK ISOENZYMES
CK-BB: NOT DETECTED % of total
CK-MB: 0 % of total (ref <5)
CK-MM: 100 % of total (ref 95–100)
Total CK: 97 U/L (ref 29–143)

## 2022-08-20 LAB — CBC
HCT: 39.7 % (ref 35.0–45.0)
Hemoglobin: 13.5 g/dL (ref 11.7–15.5)
MCH: 30.8 pg (ref 27.0–33.0)
MCHC: 34 g/dL (ref 32.0–36.0)
MCV: 90.6 fL (ref 80.0–100.0)
MPV: 11 fL (ref 7.5–12.5)
Platelets: 280 10*3/uL (ref 140–400)
RBC: 4.38 10*6/uL (ref 3.80–5.10)
RDW: 12.5 % (ref 11.0–15.0)
WBC: 9.9 10*3/uL (ref 3.8–10.8)

## 2022-08-20 LAB — ANGIOTENSIN CONVERTING ENZYME: Angiotensin-Converting Enzyme: 6 U/L — ABNORMAL LOW (ref 9–67)

## 2022-08-20 LAB — SEDIMENTATION RATE: Sed Rate: 2 mm/h (ref 0–30)

## 2022-08-20 LAB — TSH: TSH: 0.86 mIU/L (ref 0.40–4.50)

## 2022-08-20 LAB — HLA-B27 ANTIGEN: HLA-B27 Antigen: POSITIVE — AB

## 2022-08-20 LAB — CYCLIC CITRUL PEPTIDE ANTIBODY, IGG: Cyclic Citrullin Peptide Ab: <16 UNITS

## 2022-08-20 LAB — RHEUMATOID FACTOR: Rheumatoid fact SerPl-aCnc: 17 IU/mL — ABNORMAL HIGH (ref <14)

## 2022-08-20 LAB — ANA: Anti Nuclear Antibody (ANA): NEGATIVE

## 2022-08-20 LAB — URIC ACID: Uric Acid, Serum: 5.5 mg/dL (ref 2.5–7.0)

## 2022-08-23 ENCOUNTER — Other Ambulatory Visit: Payer: Self-pay | Admitting: Internal Medicine

## 2022-08-23 NOTE — Telephone Encounter (Signed)
Medication Refill - Medication: HYDROcodone-acetaminophen (NORCO) 10-325 MG tablet   Pt will be completely out of her current supply by tonight   Has the patient contacted their pharmacy? Yes.   (Agent: If no, request that the patient contact the pharmacy for the refill. If patient does not wish to contact the pharmacy document the reason why and proceed with request.) (Agent: If yes, when and what did the pharmacy advise?)  Preferred Pharmacy (with phone number or street name):  [place], [place] - [place]  [address] [place] [address]  Phone: [phone] Fax: [phone]   Has the patient been seen for an appointment in the last year OR does the patient have an upcoming appointment? Yes.    Agent: Please be advised that RX refills may take up to 3 business days. We ask that you follow-up with your pharmacy.

## 2022-08-23 NOTE — Telephone Encounter (Signed)
Requested medications are due for refill today.  Provider to decide  Requested medications are on the active medications list.  yes  Last refill. 07/25/2022 #60 0 rf  Future visit scheduled.   no  Notes to clinic.  Refill not delegated.    Requested Prescriptions  Pending Prescriptions Disp Refills   HYDROcodone-acetaminophen (NORCO) 10-325 MG tablet 60 tablet 0    Sig: Take 1 tablet by mouth 2 (two) times daily.     Not Delegated - Analgesics:  Opioid Agonist Combinations Failed - 08/23/2022  4:51 PM      Failed - This refill cannot be delegated      Failed - Urine Drug Screen completed in last 360 days      Passed - Valid encounter within last 3 months    Recent Outpatient Visits           1 week ago Ankylosing spondylitis of lumbar region [facility])   [facility] [place], [place], [place]   4 months ago Achilles tendinitis of left lower extremity   [facility] [place], [place]alie Keens, NP   5 months ago Encounter for general adult medical examination with abnormal findings   [facility] [place], Coralie Keens, NP   7 months ago Ankylosing spondylitis of lumbar region [facility])   [facility] [place], [place], NP   8 months ago Irritable bowel syndrome with both constipation and diarrhea   [facility] [place], Coralie Keens, [place]

## 2022-08-26 MED ORDER — HYDROCODONE-ACETAMINOPHEN 10-325 MG PO TABS: 1.0000 | ORAL_TABLET | Freq: Two times a day (BID) | ORAL | 0 refills | Status: DC

## 2022-09-05 ENCOUNTER — Encounter: Payer: Self-pay | Admitting: Internal Medicine

## 2022-09-05 ENCOUNTER — Ambulatory Visit: Payer: No Typology Code available for payment source | Admitting: Internal Medicine

## 2022-09-05 VITALS — BP 134/86 | HR 80 | Temp 97.3°F | Ht 67.0 in | Wt 210.6 lb

## 2022-09-05 DIAGNOSIS — M797 Fibromyalgia: Secondary | ICD-10-CM | POA: Diagnosis not present

## 2022-09-05 DIAGNOSIS — M456 Ankylosing spondylitis lumbar region: Secondary | ICD-10-CM

## 2022-09-05 DIAGNOSIS — M1711 Unilateral primary osteoarthritis, right knee: Secondary | ICD-10-CM

## 2022-09-05 DIAGNOSIS — M0580 Other rheumatoid arthritis with rheumatoid factor of unspecified site: Secondary | ICD-10-CM

## 2022-09-05 NOTE — Patient Instructions (Signed)
Ankylosing Spondylitis, Adult  Ankylosing spondylitis (AS) is a long-term (chronic) condition that causes inflammation, often in the spine. Over time, the inflammation can make new bone form in the spine. This can result in the spinal joints fusing together (ankylosis) and loss of movement. In some people, inflammation may affect other areas of the body, such as the shoulders, hips, ribs, and small joints in the hands and feet. Sometimes the eyes are affected. Inflammation can also happen in the joints of the back and in the tissues that connect joints to bones (ligaments) or connect muscles to bones (tendons). As the disease gets worse, the joints that connect the spine to the pelvis (sacroiliac or SIjoints) are often affected. The condition can range from mild to severe. You may have more severe AS if you smoke. What are the causes? The exact cause of this condition is not known. It may be caused by abnormal genes that are passed down through families. The most common gene that has been linked to AS produces a protein called HLA-B27. Even if you have genes for AS, they may need to be triggered to become active. Infection may be one trigger. What increases the risk? You are more likely to develop this condition if: You have a family history of AS. You are between the ages of 34 and 3. You are female. You have frequent gastrointestinal infections. What are the signs or symptoms? The most common symptoms are pain and stiffness that get worse with rest and better with movement. Pain may be worse at night, and stiffness may be worse in the morning. Symptoms also depend on where the inflammation occurs: Inflammation of the SI joints causes pain in the lower back and buttocks. You may also feel pain in your hips. Inflammation in the upper spine, neck, and ribs causes pain in those areas. Rib inflammation may cause shortness of breath. Inflammation in the shoulder, fingers, knees, ankles, toes, or heels may  cause pain and stiffness in those areas. Inflammation in the eyes may cause eye pain, redness, or visual problems. Generalized inflammation may cause fever, loss of appetite, and tiredness (fatigue). How is this diagnosed? This condition may be diagnosed based on: Your symptoms and your medical and family history. A physical exam. Tests, such as: X-rays or an MRI to look for joint changes or inflammation. A blood test to check for the protein HLA-B27. You may be referred to a health care provider who specializes in diseases of the bones, muscles, and joints (rheumatologist). How is this treated? There is no cure for this condition, but treatment can reduce symptoms. Treatment options may include: Medicines, such as: NSAIDs, such as ibuprofen. These may be used first to reduce pain and inflammation. Steroid shots into inflamed joints to help reverse inflammation. Disease-modifying antirheumatic drugs (DMARDs). These may reduce symptoms and slow the progression of the disease. Biologic medicines. These may be used for moderate to severe forms of AS and when other treatments are not helping. These medicines are the most effective but may increase the risk for a serious infection. Physical therapy and physical activity to help keep muscles strong and keep joints from getting stiff. Surgery may be needed for severe joint damage, usually in the hip. This may require hip replacement surgery. Follow these instructions at home: Activity Return to your normal activities as told by your health care provider. Ask your health care provider what activities are safe for you. Try to get exercise every day. Exercise is very important when  you have AS. If you have learned physical therapy exercises, practice them as told by your physical therapist. Take care to avoid falls. When appropriate, use a cane or walker. Remove area rugs from your home. General instructions  Take over-the-counter and prescription  medicines only as told by your health care provider. Maintain good posture when standing and sitting. Maintain a healthy weight. Wear shoes that are supportive and well fitting. Do not use any products that contain nicotine or tobacco. These products include cigarettes, chewing tobacco, and vaping devices, such as e-cigarettes. These can make your condition worse. If you need help quitting, ask your health care provider. Keep all follow-up visits. This is important. Where to find more information Learn as much as you can about AS. You can find support and information from the Spondylitis Association of America: www.spondylitis.org Contact a health care provider if: You are on a biologic medicine and you have a fever or any other signs of infection. You have side effects from any of your medicines. Your symptoms are not improving with medicine or are getting worse. Get help right away if: You have trouble breathing. You have a sudden change in vision. These symptoms may be an emergency. Get help right away. Call 911. Do not wait to see if the symptoms will go away. Do not drive yourself to the hospital. Summary Ankylosing spondylitis (AS) is a long-term condition that causes inflammation, often in the spine. AS may be caused by abnormal genes that can be passed down through families. The most common symptoms of AS are pain and stiffness that get worse with rest and better with movement. There is no cure for AS, but treatment can control symptoms and slow progression of the disease. NSAIDs, such as ibuprofen, may be the first treatment used. These help reduce pain and inflammation. This information is not intended to replace advice given to you by your health care provider. Make sure you discuss any questions you have with your health care provider. Document Revised: 01/08/2021 Document Reviewed: 01/08/2021 Elsevier Patient Education  [employer].

## 2022-09-05 NOTE — Progress Notes (Signed)
Subjective:    Patient ID: Belinda Calderon, female    DOB: August 19, 1960, 62 y.o.   MRN: [id number]  HPI  Patient presents to clinic today wanting to discuss a specific diet to help with her joint pain., mainly in her shoulders, knees and ankles. She describes the pain as throbbing, and stabbing. She has a history of osteoarthritis, fibromyalgia and ankylosing spondylitis.  Her recent rheumatoid factor, HLA-B27 was positive. She currently manages with Hydrocodone daily.  She has been referred to a rheumatologist, but this appt has not been made. She has an appt with [facility] for a left shoulder injection. She has been trying holistic therapies to help with inflammation.  Review of Systems  Past Medical History:  Diagnosis Date   Ankylosing spondylitis (HCC)    Degenerative disc disease    Fibromyalgia    Hypertension    IBS (irritable bowel syndrome)     Current Outpatient Medications  Medication Sig Dispense Refill   amLODipine (NORVASC) 5 MG tablet TAKE 1 TABLET BY MOUTH EVERY DAY 90 tablet 0   cholecalciferol (VITAMIN D3) 25 MCG (1000 UNIT) tablet Take 1,000 Units by mouth daily.     HYDROcodone-acetaminophen (NORCO) 10-325 MG tablet Take 1 tablet by mouth 2 (two) times daily. 60 tablet 0   lisinopril-hydrochlorothiazide (ZESTORETIC) 20-12.5 MG tablet TAKE 2 TABLETS BY MOUTH EVERY DAY 180 tablet 0   No current facility-administered medications for this visit.    Allergies  Allergen Reactions   Sulfa Antibiotics Hives and Swelling   Ibuprofen     GI IRRITATION    Family History  Problem Relation Age of Onset   Diabetes Mother    Arthritis Mother    Arthritis Father    Hyperlipidemia Father    Stroke Father    Hypertension Father    Diabetes Father    Arthritis Maternal Grandmother    Arthritis Maternal Grandfather    Arthritis Paternal Grandmother    Heart disease Paternal Grandmother    Stroke Paternal Grandmother    Hypertension Paternal Grandmother     Diabetes Paternal Grandmother    Arthritis Paternal Grandfather    Hypertension Paternal Grandfather    Diabetes Paternal Grandfather     Social History   Socioeconomic History   Marital status: Married    Spouse name: Maja Colandrea   Number of children: 3   Years of education: Not on file   Highest education level: Not on file  Occupational History   Occupation: Disable  Tobacco Use   Smoking status: Never   Smokeless tobacco: Never  Vaping Use   Vaping Use: Some days   Substances: Mixture of cannabinoids  Substance and Sexual Activity   Alcohol use: Yes    Comment: rare   Drug use: Yes    Types: Marijuana    Comment: Cannabis oil   Sexual activity: Yes  Other Topics Concern   Not on file  Social History Narrative   Not on file   Social Determinants of Health   Financial Resource Strain: Low Risk  (01/24/2022)   Overall Financial Resource Strain (CARDIA)    Difficulty of Paying Living Expenses: Not hard at all  Food Insecurity: No Food Insecurity (01/24/2022)   Hunger Vital Sign    Worried About Running Out of Food in the Last Year: Never true    Ran Out of Food in the Last Year: Never true  Transportation Needs: No Transportation Needs (01/24/2022)   PRAPARE - Transportation    Lack  of Transportation (Medical): No    Lack of Transportation (Non-Medical): No  Physical Activity: Insufficiently Active (01/24/2022)   Exercise Vital Sign    Days of Exercise per Week: 4 days    Minutes of Exercise per Session: 30 min  Stress: Stress Concern Present (01/24/2022)   [place]    Feeling of Stress : Very much  Social Connections: Moderately Isolated (01/24/2022)   Social Connection and Isolation Panel [NHANES]    Frequency of Communication with Friends and Family: More than three times a week    Frequency of Social Gatherings with Friends and Family: More than three times a week    Attends Religious  Services: Never    Marine scientist or Organizations: No    Attends Archivist Meetings: Never    Marital Status: Married  Human resources officer Violence: Not At Risk (01/24/2022)   Humiliation, Afraid, Rape, and Kick questionnaire    Fear of Current or Ex-Partner: No    Emotionally Abused: No    Physically Abused: No    Sexually Abused: No     Constitutional: Patient reports fatigue.  Denies fever, malaise, headache or abrupt weight changes.  Respiratory: Denies difficulty breathing, shortness of breath, cough or sputum production.   Cardiovascular: Denies chest pain, chest tightness, palpitations or swelling in the hands or feet.  Musculoskeletal: Patient reports multiple joint pain.  Denies decrease in range of motion, difficulty with gait, muscle pain or joint swelling.  Neurological: Denies numbness, tingling, weakness or problems with balance and coordination.    No other specific complaints in a complete review of systems (except as listed in HPI above).     Objective:   Physical Exam   BP 134/86 (BP Location: Left Arm, Patient Position: Sitting)   Pulse 80   Temp (!) 97.3 F (36.3 C)   Ht 5\' 7"  (1.702 m)   Wt 210 lb 9.6 oz (95.5 kg)   LMP  (LMP Unknown)   SpO2 96%   BMI 32.98 kg/m   Wt Readings from Last 3 Encounters:  08/16/22 210 lb (95.3 kg)  04/15/22 212 lb (96.2 kg)  03/21/22 207 lb (93.9 kg)    General: Appears her stated age, obese in NAD. Skin: Warm, dry and intact. No rashes noted. Cardiovascular: Normal rate and rhythm. S1,S2 noted.  No murmur, rubs or gallops noted.  Pulmonary/Chest: Normal effort and positive vesicular breath sounds. No respiratory distress. No wheezes, rales or ronchi noted.  Musculoskeletal: Pain with flexion and rotation of the cervical spine.  Normal flexion but decreased extension and rotation of the cervical spine.  Decreased internal and external rotation of bilateral shoulders.  Difficulty with abduction of the  left shoulder.  Negative drop can test on the right.  Strength 5/5 BUE.  Handgrips equal.  No pain with palpation of the knees.  Crepitus noted with range of motion of the knees.  No joint swelling of the knees noted.  Decreased flexion, extension and rotation of the left ankle.  Normal flexion, extension, rotation of the right ankle.  Swelling noted along the posterior aspect of the left heel.  No difficulty with gait.  Neurological: Alert and oriented.  Coordination normal.     BMET    Component Value Date/Time   NA 140 08/16/2022 1430   K 4.0 08/16/2022 1430   CL 103 08/16/2022 1430   CO2 28 08/16/2022 1430   GLUCOSE 91 08/16/2022 1430   BUN 17  08/16/2022 1430   CREATININE 0.70 08/16/2022 1430   CALCIUM 9.6 08/16/2022 1430   GFRNONAA 96 12/11/2020 0844   GFRAA 112 12/11/2020 0844    Lipid Panel     Component Value Date/Time   CHOL 171 03/21/2022 0928   TRIG 152 (H) 03/21/2022 0928   HDL 39 (L) 03/21/2022 0928   CHOLHDL 4.4 03/21/2022 0928   VLDL 63.0 (H) 12/13/2019 1254   LDLCALC 105 (H) 03/21/2022 0928    CBC    Component Value Date/Time   WBC 9.9 08/16/2022 1430   RBC 4.38 08/16/2022 1430   HGB 13.5 08/16/2022 1430   HCT 39.7 08/16/2022 1430   PLT 280 08/16/2022 1430   MCV 90.6 08/16/2022 1430   MCH 30.8 08/16/2022 1430   MCHC 34.0 08/16/2022 1430   RDW 12.5 08/16/2022 1430   LYMPHSABS 2.7 09/09/2011 2110   MONOABS 0.9 09/09/2011 2110   EOSABS 1.0 (H) 09/09/2011 2110   BASOSABS 0.0 09/09/2011 2110    Hgb A1C Lab Results  Component Value Date   HGBA1C 5.3 12/11/2020           Assessment & Plan:   Osteoarthritis, Fibromyalgia, Ankylosing Spondylitis, Positive Rheumatoid Factor:  Gave diet recommendations regarding anti-inflammatory properties She is hesitant to take prescription medication She is considering surgical interventions but would prefer this to be last resort Continue Hydrocodone as prescribed She will follow-up with rheumatology once  this appointment is scheduled  RTC in 2 months for follow-up of chronic conditions Webb Silversmith, NP

## 2022-09-25 ENCOUNTER — Other Ambulatory Visit: Payer: Self-pay | Admitting: Internal Medicine

## 2022-09-25 NOTE — Telephone Encounter (Signed)
Medication Refill - Medication:  HYDROcodone-acetaminophen (NORCO) 10-325 MG tablet   Has the patient contacted their pharmacy? No.  Preferred Pharmacy (with phone number or street name):  [facility] - [place], [place] - [address] Phone: [phone]  Fax: [phone]     Has the patient been seen for an appointment in the last year OR does the patient have an upcoming appointment? Yes.    Agent: Please be advised that RX refills may take up to 3 business days. We ask that you follow-up with your pharmacy.

## 2022-09-26 MED ORDER — HYDROCODONE-ACETAMINOPHEN 10-325 MG PO TABS: 1.0000 | ORAL_TABLET | Freq: Two times a day (BID) | ORAL | 0 refills | Status: DC

## 2022-09-26 NOTE — Telephone Encounter (Signed)
Requested medication (s) are due for refill today: yes  Requested medication (s) are on the active medication list: yes  Last refill:  08/26/22  Future visit scheduled: yes  Notes to clinic:  Unable to refill per protocol, cannot delegate.      Requested Prescriptions  Pending Prescriptions Disp Refills   HYDROcodone-acetaminophen (NORCO) 10-325 MG tablet 60 tablet 0    Sig: Take 1 tablet by mouth 2 (two) times daily.     Not Delegated - Analgesics:  Opioid Agonist Combinations Failed - 09/25/2022  1:22 PM      Failed - This refill cannot be delegated      Failed - Urine Drug Screen completed in last 360 days      Passed - Valid encounter within last 3 months    Recent Outpatient Visits           3 weeks ago Ankylosing spondylitis of lumbar region [facility])   [facility] [place], [place], NP   1 month ago Ankylosing spondylitis of lumbar region [facility])   [place] [facility] [place], Salvadore Oxford, NP   5 months ago Achilles tendinitis of left lower extremity   [place] [place], Salvadore Oxford, NP   6 months ago Encounter for general adult medical examination with abnormal findings   [place] [facility] [place], Salvadore Oxford, NP   8 months ago Ankylosing spondylitis of lumbar region [facility])   [place] [facility] [place], Salvadore Oxford, NP       Future Appointments             In 1 month Baity, Salvadore Oxford, NP [place] [facility], [facility]

## 2022-10-14 DIAGNOSIS — M19012 Primary osteoarthritis, left shoulder: Secondary | ICD-10-CM | POA: Diagnosis not present

## 2022-10-22 DIAGNOSIS — Z1211 Encounter for screening for malignant neoplasm of colon: Secondary | ICD-10-CM | POA: Diagnosis not present

## 2022-10-30 LAB — COLOGUARD: COLOGUARD: NEGATIVE

## 2022-11-04 ENCOUNTER — Other Ambulatory Visit: Payer: Self-pay | Admitting: Internal Medicine

## 2022-11-04 NOTE — Telephone Encounter (Signed)
Medication Refill - Medication: HYDROcodone-acetaminophen (NORCO) 10-325 MG tablet   Has the patient contacted their pharmacy? Yes.     Preferred Pharmacy (with phone number or street name):  [facility] - [place], [place] - [address] Phone: [phone]  Fax: [phone]      Has the patient been seen for an appointment in the last year OR does the patient have an upcoming appointment? Yes.     Please assist patient further

## 2022-11-05 ENCOUNTER — Ambulatory Visit: Payer: No Typology Code available for payment source | Admitting: Internal Medicine

## 2022-11-05 MED ORDER — HYDROCODONE-ACETAMINOPHEN 10-325 MG PO TABS: 1.0000 | ORAL_TABLET | Freq: Two times a day (BID) | ORAL | 0 refills | Status: DC

## 2022-11-05 NOTE — Telephone Encounter (Signed)
Requested medications are due for refill today.  yes  Requested medications are on the active medications list.  yes  Last refill. 09/26/2022 #60 0 rf  Future visit scheduled.   no  Notes to clinic.  Refill not delegated.    Requested Prescriptions  Pending Prescriptions Disp Refills   HYDROcodone-acetaminophen (NORCO) 10-325 MG tablet 60 tablet 0    Sig: Take 1 tablet by mouth 2 (two) times daily.     Not Delegated - Analgesics:  Opioid Agonist Combinations Failed - 11/04/2022 12:40 PM      Failed - This refill cannot be delegated      Failed - Urine Drug Screen completed in last 360 days      Passed - Valid encounter within last 3 months    Recent Outpatient Visits           2 months ago Ankylosing spondylitis of lumbar region [facility])   [facility] [place], [place] W, NP   2 months ago Ankylosing spondylitis of lumbar region [facility])   [place] [facility] [place], Salvadore Oxford, NP   6 months ago Achilles tendinitis of left lower extremity   [facility] [place], Salvadore Oxford, NP   7 months ago Encounter for general adult medical examination with abnormal findings   [place] [facility] [place], Salvadore Oxford, NP   9 months ago Ankylosing spondylitis of lumbar region [facility])   [place] [facility] [place], Salvadore Oxford, [place]

## 2022-11-08 ENCOUNTER — Other Ambulatory Visit: Payer: Self-pay | Admitting: Internal Medicine

## 2022-11-08 NOTE — Telephone Encounter (Signed)
Requested Prescriptions  Pending Prescriptions Disp Refills   amLODipine (NORVASC) 5 MG tablet [Pharmacy Med Name: AMLODIPINE BESYLATE 5 MG TAB] 90 tablet 1    Sig: TAKE 1 TABLET BY MOUTH EVERY DAY     Cardiovascular: Calcium Channel Blockers 2 Passed - 11/08/2022  2:30 AM      Passed - Last BP in normal range    BP Readings from Last 1 Encounters:  09/05/22 134/86         Passed - Last Heart Rate in normal range    Pulse Readings from Last 1 Encounters:  09/05/22 80         Passed - Valid encounter within last 6 months    Recent Outpatient Visits           2 months ago Ankylosing spondylitis of lumbar region [facility])   [place] [facility] [place], [place], NP   2 months ago Ankylosing spondylitis of lumbar region [facility])   [place] [facility] [place], Salvadore Oxford, NP   6 months ago Achilles tendinitis of left lower extremity   [place] [facility] [place], Salvadore Oxford, NP   7 months ago Encounter for general adult medical examination with abnormal findings   [facility] [place], Salvadore Oxford, NP   9 months ago Ankylosing spondylitis of lumbar region [facility])   [place] [place], Salvadore Oxford, NP               lisinopril-hydrochlorothiazide (ZESTORETIC) 20-12.5 MG tablet [Pharmacy Med Name: LISINOPRIL-HCTZ 20-12.5 MG TAB] 180 tablet 1    Sig: TAKE 2 TABLETS BY MOUTH EVERY DAY     Cardiovascular:  ACEI + Diuretic Combos Passed - 11/08/2022  2:30 AM      Passed - Na in normal range and within 180 days    Sodium  Date Value Ref Range Status  08/16/2022 140 135 - 146 mmol/L Final         Passed - K in normal range and within 180 days    Potassium  Date Value Ref Range Status  08/16/2022 4.0 3.5 - 5.3 mmol/L Final         Passed - Cr in normal range and within 180 days    Creat  Date Value Ref Range Status  08/16/2022 0.70 0.50 - 1.05 mg/dL Final         Passed - eGFR is 30 or  above and within 180 days    GFR, Est African American  Date Value Ref Range Status  12/11/2020 112 > OR = 60 mL/min/1.13m2 Final   GFR, Est Non African American  Date Value Ref Range Status  12/11/2020 96 > OR = 60 mL/min/1.57m2 Final   GFR  Date Value Ref Range Status  07/26/2020 93.23 >60.00 mL/min Final    Comment:    Calculated using the CKD-EPI Creatinine Equation (2021)   eGFR  Date Value Ref Range Status  08/16/2022 98 > OR = 60 mL/min/1.37m2 Final         Passed - Patient is not pregnant      Passed - Last BP in normal range    BP Readings from Last 1 Encounters:  09/05/22 134/86         Passed - Valid encounter within last 6 months    Recent Outpatient Visits           2 months ago Ankylosing spondylitis of lumbar  region [facility])   [place] [facility] [place], [place] W, NP   2 months ago Ankylosing spondylitis of lumbar region [facility])   [place] [facility] [place], Salvadore Oxford, NP   6 months ago Achilles tendinitis of left lower extremity   [place] [facility] [place], Salvadore Oxford, NP   7 months ago Encounter for general adult medical examination with abnormal findings   [place] [facility] [place], Salvadore Oxford, NP   9 months ago Ankylosing spondylitis of lumbar region [facility])   [place] [facility] [place], Salvadore Oxford, [place]

## 2022-11-11 DIAGNOSIS — H524 Presbyopia: Secondary | ICD-10-CM | POA: Diagnosis not present

## 2022-11-22 DIAGNOSIS — H52223 Regular astigmatism, bilateral: Secondary | ICD-10-CM | POA: Diagnosis not present

## 2022-11-22 DIAGNOSIS — H524 Presbyopia: Secondary | ICD-10-CM | POA: Diagnosis not present

## 2022-12-03 ENCOUNTER — Encounter: Payer: Self-pay | Admitting: Internal Medicine

## 2022-12-03 ENCOUNTER — Ambulatory Visit (INDEPENDENT_AMBULATORY_CARE_PROVIDER_SITE_OTHER): Payer: No Typology Code available for payment source | Admitting: Internal Medicine

## 2022-12-03 VITALS — BP 126/82 | HR 81 | Temp 96.7°F | Wt 206.0 lb

## 2022-12-03 DIAGNOSIS — E6609 Other obesity due to excess calories: Secondary | ICD-10-CM

## 2022-12-03 DIAGNOSIS — M1711 Unilateral primary osteoarthritis, right knee: Secondary | ICD-10-CM

## 2022-12-03 DIAGNOSIS — F32A Depression, unspecified: Secondary | ICD-10-CM

## 2022-12-03 DIAGNOSIS — E782 Mixed hyperlipidemia: Secondary | ICD-10-CM

## 2022-12-03 DIAGNOSIS — M456 Ankylosing spondylitis lumbar region: Secondary | ICD-10-CM

## 2022-12-03 DIAGNOSIS — G894 Chronic pain syndrome: Secondary | ICD-10-CM | POA: Diagnosis not present

## 2022-12-03 DIAGNOSIS — Z6832 Body mass index (BMI) 32.0-32.9, adult: Secondary | ICD-10-CM | POA: Diagnosis not present

## 2022-12-03 DIAGNOSIS — K582 Mixed irritable bowel syndrome: Secondary | ICD-10-CM | POA: Diagnosis not present

## 2022-12-03 DIAGNOSIS — M797 Fibromyalgia: Secondary | ICD-10-CM | POA: Diagnosis not present

## 2022-12-03 DIAGNOSIS — F419 Anxiety disorder, unspecified: Secondary | ICD-10-CM | POA: Diagnosis not present

## 2022-12-03 DIAGNOSIS — I10 Essential (primary) hypertension: Secondary | ICD-10-CM | POA: Diagnosis not present

## 2022-12-03 NOTE — Assessment & Plan Note (Signed)
Continue lisinopril hct and amlodipine Reinforced DASH diet and exercise for weight loss

## 2022-12-03 NOTE — Assessment & Plan Note (Signed)
Continue hydrocodone as needed.   

## 2022-12-03 NOTE — Patient Instructions (Signed)

## 2022-12-03 NOTE — Assessment & Plan Note (Signed)
Encouraged regular stretching 

## 2022-12-03 NOTE — Assessment & Plan Note (Signed)
Encouraged diet and exercise for weight loss ?

## 2022-12-03 NOTE — Assessment & Plan Note (Signed)
Encourage low fodmap diet

## 2022-12-03 NOTE — Assessment & Plan Note (Signed)
Encourage low-fat diet 

## 2022-12-03 NOTE — Progress Notes (Signed)
Subjective:    Patient ID: Belinda Calderon, female    DOB: 03-Jul-1960, 62 y.o.   MRN: [id number]  HPI  Patient presents to clinic today for follow-up of chronic conditions.  HTN: Her BP today is 126/82.  She is taking lisinopril HCT and amlodipine as prescribed.  ECG from 01/2017 reviewed.  OA/Fibromyalgia/Ankylosing Spondylitis: This is currently managed on hydrocodone.  She follows with rheumatology.  Anxiety/Panic Attacks and Depression: Chronic.  She takes clonazepam rarely.  She does smoke THC to help her cope.  She is not currently seeing a therapist.  She denies SI/HI.  IBS: She reports alternating constipation and diarrhea.  She tries to control this with diet.  There is no colonoscopy on file.  HLD: Her last LDL was 105, triglycerides 152, 05/2022.  She is not taking any cholesterol-lowering medication at this time.  She tries to consume low-fat diet.  Review of Systems     Past Medical History:  Diagnosis Date   Ankylosing spondylitis (HCC)    Degenerative disc disease    Fibromyalgia    Hypertension    IBS (irritable bowel syndrome)     Current Outpatient Medications  Medication Sig Dispense Refill   amLODipine (NORVASC) 5 MG tablet TAKE 1 TABLET BY MOUTH EVERY DAY 90 tablet 1   cholecalciferol (VITAMIN D3) 25 MCG (1000 UNIT) tablet Take 1,000 Units by mouth daily.     HYDROcodone-acetaminophen (NORCO) 10-325 MG tablet Take 1 tablet by mouth 2 (two) times daily. 60 tablet 0   lisinopril-hydrochlorothiazide (ZESTORETIC) 20-12.5 MG tablet TAKE 2 TABLETS BY MOUTH EVERY DAY 180 tablet 1   No current facility-administered medications for this visit.    Allergies  Allergen Reactions   Sulfa Antibiotics Hives and Swelling   Ibuprofen     GI IRRITATION    Family History  Problem Relation Age of Onset   Diabetes Mother    Arthritis Mother    Arthritis Father    Hyperlipidemia Father    Stroke Father    Hypertension Father    Diabetes Father    Arthritis  Maternal Grandmother    Arthritis Maternal Grandfather    Arthritis Paternal Grandmother    Heart disease Paternal Grandmother    Stroke Paternal Grandmother    Hypertension Paternal Grandmother    Diabetes Paternal Grandmother    Arthritis Paternal Grandfather    Hypertension Paternal Grandfather    Diabetes Paternal Grandfather     Social History   Socioeconomic History   Marital status: Married    Spouse name: Sary Fishback   Number of children: 3   Years of education: Not on file   Highest education level: Not on file  Occupational History   Occupation: Disable  Tobacco Use   Smoking status: Never   Smokeless tobacco: Never  Vaping Use   Vaping Use: Some days   Substances: Mixture of cannabinoids  Substance and Sexual Activity   Alcohol use: Yes    Comment: rare   Drug use: Yes    Types: Marijuana    Comment: Cannabis oil   Sexual activity: Yes  Other Topics Concern   Not on file  Social History Narrative   Not on file   Social Determinants of Health   Financial Resource Strain: Low Risk  (01/24/2022)   Overall Financial Resource Strain (CARDIA)    Difficulty of Paying Living Expenses: Not hard at all  Food Insecurity: No Food Insecurity (01/24/2022)   Hunger Vital Sign    Worried About  Running Out of Food in the Last Year: Never true    Ran Out of Food in the Last Year: Never true  Transportation Needs: No Transportation Needs (01/24/2022)   PRAPARE - Administrator, Civil Service (Medical): No    Lack of Transportation (Non-Medical): No  Physical Activity: Insufficiently Active (01/24/2022)   Exercise Vital Sign    Days of Exercise per Week: 4 days    Minutes of Exercise per Session: 30 min  Stress: Stress Concern Present (01/24/2022)   [employer] of Occupational Health - Occupational Stress Questionnaire    Feeling of Stress : Very much  Social Connections: Moderately Isolated (01/24/2022)   Social Connection and Isolation Panel [NHANES]     Frequency of Communication with Friends and Family: More than three times a week    Frequency of Social Gatherings with Friends and Family: More than three times a week    Attends Religious Services: Never    Database administrator or Organizations: No    Attends Banker Meetings: Never    Marital Status: Married  Catering manager Violence: Not At Risk (01/24/2022)   Humiliation, Afraid, Rape, and Kick questionnaire    Fear of Current or Ex-Partner: No    Emotionally Abused: No    Physically Abused: No    Sexually Abused: No     Constitutional: Denies fever, malaise, fatigue, headache or abrupt weight changes.  HEENT: Denies eye pain, eye redness, ear pain, ringing in the ears, wax buildup, runny nose, nasal congestion, bloody nose, or sore throat. Respiratory: Denies difficulty breathing, shortness of breath, cough or sputum production.   Cardiovascular: Denies chest pain, chest tightness, palpitations or swelling in the hands or feet.  Gastrointestinal: Patient reports alternating constipation and diarrhea.  Denies abdominal pain, bloating, or blood in the stool.  GU: Denies urgency, frequency, pain with urination, burning sensation, blood in urine, odor or discharge. Musculoskeletal: Patient reports chronic joint and muscle pain.  Denies decrease in range of motion, difficulty with gait, or joint swelling.  Skin: Denies redness, rashes, lesions or ulcercations.  Neurological: Denies dizziness, difficulty with memory, difficulty with speech or problems with balance and coordination.  Psych: Patient has a history of anxiety and depression.  Denies SI/HI.  No other specific complaints in a complete review of systems (except as listed in HPI above).  Objective:   Physical Exam  BP 126/82 (BP Location: Left Arm, Patient Position: Sitting, Cuff Size: Normal)   Pulse 81   Temp (!) 96.7 F (35.9 C) (Temporal)   Wt 206 lb (93.4 kg)   LMP  (LMP Unknown)   SpO2 97%   BMI  32.26 kg/m   Wt Readings from Last 3 Encounters:  09/05/22 210 lb 9.6 oz (95.5 kg)  08/16/22 210 lb (95.3 kg)  04/15/22 212 lb (96.2 kg)    General: Appears her stated age, obese, in NAD. Skin: Warm, dry and intact.  Cardiovascular: Normal rate and rhythm. S1,S2 noted.  No murmur, rubs or gallops noted. No JVD or BLE edema.  Pulmonary/Chest: Normal effort and positive vesicular breath sounds. No respiratory distress. No wheezes, rales or ronchi noted.  Musculoskeletal: bilateral hands in braces. Brace to left lower extremity. Gait slow and steady without device.  Neurological: Alert and oriented. Coordination normal.  Psychiatric: Mood and affect normal. Behavior is normal. Judgment and thought content normal.     BMET    Component Value Date/Time   NA 140 08/16/2022 1430   K  4.0 08/16/2022 1430   CL 103 08/16/2022 1430   CO2 28 08/16/2022 1430   GLUCOSE 91 08/16/2022 1430   BUN 17 08/16/2022 1430   CREATININE 0.70 08/16/2022 1430   CALCIUM 9.6 08/16/2022 1430   GFRNONAA 96 12/11/2020 0844   GFRAA 112 12/11/2020 0844    Lipid Panel     Component Value Date/Time   CHOL 171 03/21/2022 0928   TRIG 152 (H) 03/21/2022 0928   HDL 39 (L) 03/21/2022 0928   CHOLHDL 4.4 03/21/2022 0928   VLDL 63.0 (H) 12/13/2019 1254   LDLCALC 105 (H) 03/21/2022 0928    CBC    Component Value Date/Time   WBC 9.9 08/16/2022 1430   RBC 4.38 08/16/2022 1430   HGB 13.5 08/16/2022 1430   HCT 39.7 08/16/2022 1430   PLT 280 08/16/2022 1430   MCV 90.6 08/16/2022 1430   MCH 30.8 08/16/2022 1430   MCHC 34.0 08/16/2022 1430   RDW 12.5 08/16/2022 1430   LYMPHSABS 2.7 09/09/2011 2110   MONOABS 0.9 09/09/2011 2110   EOSABS 1.0 (H) 09/09/2011 2110   BASOSABS 0.0 09/09/2011 2110    Hgb A1C Lab Results  Component Value Date   HGBA1C 5.3 12/11/2020            Assessment & Plan:      RTC in 6 months for annual exam Nicki Reaper, NP

## 2022-12-03 NOTE — Assessment & Plan Note (Signed)
Continue clonazepam as needed 

## 2022-12-06 ENCOUNTER — Other Ambulatory Visit: Payer: Self-pay | Admitting: Internal Medicine

## 2022-12-06 MED ORDER — HYDROCODONE-ACETAMINOPHEN 10-325 MG PO TABS: 1.0000 | ORAL_TABLET | Freq: Two times a day (BID) | ORAL | 0 refills | Status: DC

## 2022-12-06 NOTE — Telephone Encounter (Signed)
Medication Refill - Medication: HYDROcodone-acetaminophen (NORCO) 10-325 MG tablet   Has the patient contacted their pharmacy? Yes.   (Agent: If no, request that the patient contact the pharmacy for the refill. If patient does not wish to contact the pharmacy document the reason why and proceed with request.) (Agent: If yes, when and what did the pharmacy advise?)  Preferred Pharmacy (with phone number or street name):  [facility] - [place], [place] - [address]  [address] [place] [address]  Phone: [phone] Fax: [phone]   Has the patient been seen for an appointment in the last year OR does the patient have an upcoming appointment? Yes.    Agent: Please be advised that RX refills may take up to 3 business days. We ask that you follow-up with your pharmacy.

## 2022-12-06 NOTE — Telephone Encounter (Signed)
Requested medication (s) are due for refill today: yes  Requested medication (s) are on the active medication list: yes  Last refill:  11/05/22  Future visit scheduled: yes  Notes to clinic:  Unable to refill per protocol, cannot delegate.      Requested Prescriptions  Pending Prescriptions Disp Refills   HYDROcodone-acetaminophen (NORCO) 10-325 MG tablet 60 tablet 0    Sig: Take 1 tablet by mouth 2 (two) times daily.     Not Delegated - Analgesics:  Opioid Agonist Combinations Failed - 12/06/2022 10:28 AM      Failed - This refill cannot be delegated      Failed - Urine Drug Screen completed in last 360 days      Passed - Valid encounter within last 3 months    Recent Outpatient Visits           3 days ago Primary hypertension   [place] [facility] [place], [place] W, NP   3 months ago Ankylosing spondylitis of lumbar region [facility])   [place] [facility] [place], Salvadore Oxford, NP   3 months ago Ankylosing spondylitis of lumbar region [facility])   [place] [facility] [place], Salvadore Oxford, NP   7 months ago Achilles tendinitis of left lower extremity   [place] [facility] [place], Salvadore Oxford, NP   8 months ago Encounter for general adult medical examination with abnormal findings   [facility] [place], Salvadore Oxford, NP       Future Appointments             In 5 months Baity, Salvadore Oxford, NP  [facility]

## 2023-01-06 ENCOUNTER — Other Ambulatory Visit: Payer: Self-pay | Admitting: Internal Medicine

## 2023-01-06 NOTE — Telephone Encounter (Signed)
Medication Refill - Medication: HYDROcodone-acetaminophen (NORCO) 10-325 MG tablet   Has the patient contacted their pharmacy? Yes.   (Agent: If no, request that the patient contact the pharmacy for the refill. If patient does not wish to contact the pharmacy document the reason why and proceed with request.) (Agent: If yes, when and what did the pharmacy advise?)  Preferred Pharmacy (with phone number or street name):  [facility] - [place], [place] - [address]  [address] [place] [address]  Phone: [phone] Fax: [phone]   Has the patient been seen for an appointment in the last year OR does the patient have an upcoming appointment? Yes.    Agent: Please be advised that RX refills may take up to 3 business days. We ask that you follow-up with your pharmacy.

## 2023-01-07 MED ORDER — HYDROCODONE-ACETAMINOPHEN 10-325 MG PO TABS: 1.0000 | ORAL_TABLET | Freq: Two times a day (BID) | ORAL | 0 refills | Status: DC

## 2023-01-07 NOTE — Telephone Encounter (Signed)
Requested medications are due for refill today.  yes  Requested medications are on the active medications list.  yes  Last refill. 12/06/2022 #60 0 rf  Future visit scheduled.   yes  Notes to clinic.  Refill not delegated.    Requested Prescriptions  Pending Prescriptions Disp Refills   HYDROcodone-acetaminophen (NORCO) 10-325 MG tablet 60 tablet 0    Sig: Take 1 tablet by mouth 2 (two) times daily.     Not Delegated - Analgesics:  Opioid Agonist Combinations Failed - 01/06/2023 11:48 AM      Failed - This refill cannot be delegated      Failed - Urine Drug Screen completed in last 360 days      Passed - Valid encounter within last 3 months    Recent Outpatient Visits           1 month ago Primary hypertension   [place] [facility] [place], [place] W, NP   4 months ago Ankylosing spondylitis of lumbar region [facility])   [place] [facility] [place], Salvadore Oxford, NP   4 months ago Ankylosing spondylitis of lumbar region [facility])   [employer] [facility] [facility] [place], Salvadore Oxford, NP   8 months ago Achilles tendinitis of left lower extremity   [place] [facility] [place], Salvadore Oxford, NP   9 months ago Encounter for general adult medical examination with abnormal findings   [place] [facility] [place], Salvadore Oxford, NP       Future Appointments             In 4 months Baity, Salvadore Oxford, NP [place], [facility]

## 2023-02-04 ENCOUNTER — Other Ambulatory Visit: Payer: Self-pay | Admitting: Internal Medicine

## 2023-02-04 NOTE — Telephone Encounter (Signed)
Medication Refill - Medication: HYDROcodone-acetaminophen (NORCO) 10-325 MG tablet [[id number]]  Has the patient contacted their pharmacy? No.  Preferred Pharmacy (with phone number or street name):  [facility] - [place], [place] - [address]  [address] [place], [place] [address]  Phone:  [phone]  Fax:  [phone]    Has the patient been seen for an appointment in the last year OR does the patient have an upcoming appointment? Yes.    Agent: Please be advised that RX refills may take up to 3 business days. We ask that you follow-up with your pharmacy.

## 2023-02-05 MED ORDER — HYDROCODONE-ACETAMINOPHEN 10-325 MG PO TABS: 1.0000 | ORAL_TABLET | Freq: Two times a day (BID) | ORAL | 0 refills | Status: DC

## 2023-02-05 NOTE — Telephone Encounter (Signed)
Requested medication (s) are due for refill today: Yes  Requested medication (s) are on the active medication list: Yes  Last refill:  01/07/23  Future visit scheduled: Yes  Notes to clinic:  Unable to refill per protocol, cannot delegate.      Requested Prescriptions  Pending Prescriptions Disp Refills   HYDROcodone-acetaminophen (NORCO) 10-325 MG tablet 60 tablet 0    Sig: Take 1 tablet by mouth 2 (two) times daily.     Not Delegated - Analgesics:  Opioid Agonist Combinations Failed - 02/04/2023  5:26 PM      Failed - This refill cannot be delegated      Failed - Urine Drug Screen completed in last 360 days      Passed - Valid encounter within last 3 months    Recent Outpatient Visits           2 months ago Primary hypertension   [place] [facility] [place], [place] W, NP   5 months ago Ankylosing spondylitis of lumbar region [facility])   [place] [facility] [place], Salvadore Oxford, NP   5 months ago Ankylosing spondylitis of lumbar region [facility])   [place], Salvadore Oxford, NP   9 months ago Achilles tendinitis of left lower extremity   [place] [facility] [place], Salvadore Oxford, NP   10 months ago Encounter for general adult medical examination with abnormal findings   [place] [facility] [place], Salvadore Oxford, NP       Future Appointments             In 3 months Baity, Salvadore Oxford, NP  [facility], [facility]

## 2023-02-10 ENCOUNTER — Other Ambulatory Visit: Payer: Self-pay | Admitting: Internal Medicine

## 2023-02-10 ENCOUNTER — Telehealth: Payer: Self-pay | Admitting: Internal Medicine

## 2023-02-10 NOTE — Telephone Encounter (Signed)
Already requested today by the pharmacy in a separate refill encounter.

## 2023-02-10 NOTE — Telephone Encounter (Signed)
Medication Refill - Medication:  HYDROcodone-acetaminophen (NORCO) 10-325 MG tablet   Has the patient contacted their pharmacy? Yes.   Landon from the Pharmacy states that they never received the refill request on 02/05/2023.   Preferred Pharmacy (with phone number or street name):  [facility] - [place], [place] - [address] [place], [place] [address] Phone: [phone]  Fax: [phone]   Has the patient been seen for an appointment in the last year OR does the patient have an upcoming appointment? Yes.    Agent: Please be advised that RX refills may take up to 3 business days. We ask that you follow-up with your pharmacy.

## 2023-02-11 ENCOUNTER — Other Ambulatory Visit: Payer: Self-pay | Admitting: Internal Medicine

## 2023-02-11 ENCOUNTER — Other Ambulatory Visit: Payer: Self-pay

## 2023-02-11 MED ORDER — HYDROCODONE-ACETAMINOPHEN 10-325 MG PO TABS: 1.0000 | ORAL_TABLET | Freq: Two times a day (BID) | ORAL | 0 refills | Status: DC

## 2023-02-11 NOTE — Telephone Encounter (Signed)
Pt needs a refill sent to [facility] pharmacy.    Thanks,   -Vernona Rieger

## 2023-02-14 ENCOUNTER — Telehealth: Payer: Self-pay | Admitting: Internal Medicine

## 2023-02-14 NOTE — Telephone Encounter (Signed)
LMTCB 02/14/2023.  PEC please advise if pt needs a refill on her lisinopril.    Thanks,   -Vernona Rieger

## 2023-02-14 NOTE — Telephone Encounter (Signed)
Antonietta Jewel from [facility] called wanting Dr Kirtland Bouchard to advise patient about medication adherence for lisinopril-hydrochlorothiazide (ZESTORETIC) 20-12.5 MG tablet as she needs to get her refills. She has attempted to contact patient with no success.

## 2023-02-19 ENCOUNTER — Other Ambulatory Visit: Payer: Self-pay | Admitting: Internal Medicine

## 2023-02-19 NOTE — Telephone Encounter (Unsigned)
Copied from CRM [id number]. Topic: General - Other >> Feb 19, 2023  8:46 AM Everette C wrote: Reason for CRM: Medication Refill - Medication: amLODipine (NORVASC) 5 MG tablet [[id number]]  lisinopril-hydrochlorothiazide (ZESTORETIC) 20-12.5 MG tablet [[id number]]  Has the patient contacted their pharmacy? Yes.   (Agent: If no, request that the patient contact the pharmacy for the refill. If patient does not wish to contact the pharmacy document the reason why and proceed with request.) (Agent: If yes, when and what did the pharmacy advise?)  Preferred Pharmacy (with phone number or street name): [phone] [place] - [address] [place] [address] Phone: [phone] Fax: [phone] Hours: Not open 24 hours   Has the patient been seen for an appointment in the last year OR does the patient have an upcoming appointment? Yes.    Agent: Please be advised that RX refills may take up to 3 business days. We ask that you follow-up with your pharmacy.

## 2023-02-27 ENCOUNTER — Ambulatory Visit (INDEPENDENT_AMBULATORY_CARE_PROVIDER_SITE_OTHER): Payer: No Typology Code available for payment source | Admitting: Internal Medicine

## 2023-02-27 ENCOUNTER — Encounter: Payer: Self-pay | Admitting: Internal Medicine

## 2023-02-27 VITALS — BP 144/82 | HR 72 | Temp 96.8°F | Wt 207.0 lb

## 2023-02-27 DIAGNOSIS — G8929 Other chronic pain: Secondary | ICD-10-CM | POA: Diagnosis not present

## 2023-02-27 DIAGNOSIS — F419 Anxiety disorder, unspecified: Secondary | ICD-10-CM

## 2023-02-27 DIAGNOSIS — F32A Depression, unspecified: Secondary | ICD-10-CM | POA: Diagnosis not present

## 2023-02-27 DIAGNOSIS — M25561 Pain in right knee: Secondary | ICD-10-CM

## 2023-02-27 NOTE — Assessment & Plan Note (Signed)
Deteriorated She does not want to take prescription medication at this time Encouraged her to try St. John's wort OTC She is not interested in seeing a therapist Support offered

## 2023-02-27 NOTE — Patient Instructions (Signed)
Knee Exercises Ask your health care provider which exercises are safe for you. Do exercises exactly as told by your health care provider and adjust them as directed. It is normal to feel mild stretching, pulling, tightness, or discomfort as you do these exercises. Stop right away if you feel sudden pain or your pain gets worse. Do not begin these exercises until told by your health care provider. Stretching and range-of-motion exercises These exercises warm up your muscles and joints and improve the movement and flexibility of your knee. These exercises also help to relieve pain and swelling. Knee extension, prone  Lie on your abdomen (prone position) on a bed. Place your left / right knee just beyond the edge of the surface so your knee is not on the bed. You can put a towel under your left / right thigh just above your kneecap for comfort. Relax your leg muscles and allow gravity to straighten your knee (extension). You should feel a stretch behind your left / right knee. Hold this position for __________ seconds. Scoot up so your knee is supported between repetitions. Repeat __________ times. Complete this exercise __________ times a day. Knee flexion, active  Lie on your back with both legs straight. If this causes back discomfort, bend your left / right knee so your foot is flat on the floor. Slowly slide your left / right heel back toward your buttocks. Stop when you feel a gentle stretch in the front of your knee or thigh (flexion). Hold this position for __________ seconds. Slowly slide your left / right heel back to the starting position. Repeat __________ times. Complete this exercise __________ times a day. Quadriceps stretch, prone  Lie on your abdomen on a firm surface, such as a bed or padded floor. Bend your left / right knee and hold your ankle. If you cannot reach your ankle or pant leg, loop a belt around your foot and grab the belt instead. Gently pull your heel toward your  buttocks. Your knee should not slide out to the side. You should feel a stretch in the front of your thigh and knee (quadriceps). Hold this position for __________ seconds. Repeat __________ times. Complete this exercise __________ times a day. Hamstring, supine  Lie on your back (supine position). Loop a belt or towel over the ball of your left / right foot. The ball of your foot is on the walking surface, right under your toes. Straighten your left / right knee and slowly pull on the belt to raise your leg until you feel a gentle stretch behind your knee (hamstring). Do not let your knee bend while you do this. Keep your other leg flat on the floor. Hold this position for __________ seconds. Repeat __________ times. Complete this exercise __________ times a day. Strengthening exercises These exercises build strength and endurance in your knee. Endurance is the ability to use your muscles for a long time, even after they get tired. Quadriceps, isometric This exercise strengthens the muscles in front of your thigh (quadriceps) without moving your knee joint (isometric). Lie on your back with your left / right leg extended and your other knee bent. Put a rolled towel or small pillow under your knee if told by your health care provider. Slowly tense the muscles in the front of your left / right thigh. You should see your kneecap slide up toward your hip or see increased dimpling just above the knee. This motion will push the back of the knee toward the floor.  For __________ seconds, hold the muscle as tight as you can without increasing your pain. Relax the muscles slowly and completely. Repeat __________ times. Complete this exercise __________ times a day. Straight leg raises This exercise strengthens the muscles in front of your thigh (quadriceps) and the muscles that move your hips (hip flexors). Lie on your back with your left / right leg extended and your other knee bent. Tense the  muscles in the front of your left / right thigh. You should see your kneecap slide up or see increased dimpling just above the knee. Your thigh may even shake a bit. Keep these muscles tight as you raise your leg 4-6 inches (10-15 cm) off the floor. Do not let your knee bend. Hold this position for __________ seconds. Keep these muscles tense as you lower your leg. Relax your muscles slowly and completely after each repetition. Repeat __________ times. Complete this exercise __________ times a day. Hamstring, isometric  Lie on your back on a firm surface. Bend your left / right knee about __________ degrees. Dig your left / right heel into the surface as if you are trying to pull it toward your buttocks. Tighten the muscles in the back of your thighs (hamstring) to "dig" as hard as you can without increasing any pain. Hold this position for __________ seconds. Release the tension gradually and allow your muscles to relax completely for __________ seconds after each repetition. Repeat __________ times. Complete this exercise __________ times a day. Hamstring curls If told by your health care provider, do this exercise while wearing ankle weights. Begin with __________lb / kg weights. Then increase the weight by 1 lb (0.5 kg) increments. Do not wear ankle weights that are more than __________lb / kg. Lie on your abdomen with your legs straight. Bend your left / right knee as far as you can without feeling pain. Keep your hips flat against the floor. Hold this position for __________ seconds. Slowly lower your leg to the starting position. Repeat __________ times. Complete this exercise __________ times a day. Squats This exercise strengthens the muscles in front of your thigh and knee (quadriceps). Stand in front of a table, with your feet and knees pointing straight ahead. You may rest your hands on the table for balance but not for support. Slowly bend your knees and lower your hips like you  are going to sit in a chair. Keep your weight over your heels, not over your toes. Keep your lower legs upright so they are parallel with the table legs. Do not let your hips go lower than your knees. Do not bend lower than told by your health care provider. If your knee pain increases, do not bend as low. Hold the squat position for __________ seconds. Slowly push with your legs to return to standing. Do not use your hands to pull yourself to standing. Repeat __________ times. Complete this exercise __________ times a day. Wall slides This exercise strengthens the muscles in front of your thigh and knee (quadriceps). Lean your back against a smooth wall or door, and walk your feet out 18-24 inches (46-61 cm) from it. Place your feet hip-width apart. Slowly slide down the wall or door until your knees bend __________ degrees. Keep your knees over your heels, not over your toes. Keep your knees in line with your hips. Hold this position for __________ seconds. Repeat __________ times. Complete this exercise __________ times a day. Straight leg raises, side-lying This exercise strengthens the muscles that rotate  the leg at the hip and move it away from your body (hip abductors). Lie on your side with your left / right leg in the top position. Lie so your head, shoulder, knee, and hip line up. You may bend your bottom knee to help you keep your balance. Roll your hips slightly forward so your hips are stacked directly over each other and your left / right knee is facing forward. Leading with your heel, lift your top leg 4-6 inches (10-15 cm). You should feel the muscles in your outer hip lifting. Do not let your foot drift forward. Do not let your knee roll toward the ceiling. Hold this position for __________ seconds. Slowly return your leg to the starting position. Let your muscles relax completely after each repetition. Repeat __________ times. Complete this exercise __________ times a  day. Straight leg raises, prone This exercise stretches the muscles that move your hips away from the front of the pelvis (hip extensors). Lie on your abdomen on a firm surface. You can put a pillow under your hips if that is more comfortable. Tense the muscles in your buttocks and lift your left / right leg about 4-6 inches (10-15 cm). Keep your knee straight as you lift your leg. Hold this position for __________ seconds. Slowly lower your leg to the starting position. Let your leg relax completely after each repetition. Repeat __________ times. Complete this exercise __________ times a day. This information is not intended to replace advice given to you by your health care provider. Make sure you discuss any questions you have with your health care provider. Document Revised: 02/13/2021 Document Reviewed: 02/13/2021 Elsevier Patient Education  2024 [employer].

## 2023-02-27 NOTE — Progress Notes (Signed)
Subjective:    Patient ID: Belinda Calderon, female    DOB: May 10, 1961, 62 y.o.   MRN: [id number]  HPI  Pt presents to the clinic today with c/o right knee pain. This has been a chronic issue that has worsened over the last few months. She describes the pain as sharp and stabbing. The pain is worse with bending the knee, or ambulation. She has associated numbness and tingling in her right lower extremity. She denies recent fall. Xray right knee from 11/2018 reviewed, showed severe tricompartmental degenerative changes.  She also reports persistent anxiety and depression.  She has been under a lot of stress since her parents moved up from [place] along with her brother.  She reports all of their health is not well and she is having caregiver stress related to this.  She has also been having health issues which is contributing to a more depressed and anxious mood.  She is unable to get out and do things that she likes like gardening, baking bread, spending time in her pool.  She is not currently taking any medications for this.  She has been on clonazepam in the past but per reports she threw this out months ago.  She is not currently seeing a therapist.  Review of Systems     Past Medical History:  Diagnosis Date   Ankylosing spondylitis (HCC)    Degenerative disc disease    Fibromyalgia    Hypertension    IBS (irritable bowel syndrome)     Current Outpatient Medications  Medication Sig Dispense Refill   amLODipine (NORVASC) 5 MG tablet TAKE 1 TABLET BY MOUTH EVERY DAY 90 tablet 1   cholecalciferol (VITAMIN D3) 25 MCG (1000 UNIT) tablet Take 1,000 Units by mouth daily.     HYDROcodone-acetaminophen (NORCO) 10-325 MG tablet Take 1 tablet by mouth 2 (two) times daily. 60 tablet 0   lisinopril-hydrochlorothiazide (ZESTORETIC) 20-12.5 MG tablet TAKE 2 TABLETS BY MOUTH EVERY DAY 180 tablet 1   No current facility-administered medications for this visit.    Allergies  Allergen Reactions    Sulfa Antibiotics Hives and Swelling   Ibuprofen     GI IRRITATION    Family History  Problem Relation Age of Onset   Diabetes Mother    Arthritis Mother    Arthritis Father    Hyperlipidemia Father    Stroke Father    Hypertension Father    Diabetes Father    Arthritis Maternal Grandmother    Arthritis Maternal Grandfather    Arthritis Paternal Grandmother    Heart disease Paternal Grandmother    Stroke Paternal Grandmother    Hypertension Paternal Grandmother    Diabetes Paternal Grandmother    Arthritis Paternal Grandfather    Hypertension Paternal Grandfather    Diabetes Paternal Grandfather     Social History   Socioeconomic History   Marital status: Married    Spouse name: Zenaya Growney   Number of children: 3   Years of education: Not on file   Highest education level: Not on file  Occupational History   Occupation: Disable  Tobacco Use   Smoking status: Never   Smokeless tobacco: Never  Vaping Use   Vaping status: Some Days   Substances: Mixture of cannabinoids  Substance and Sexual Activity   Alcohol use: Yes    Comment: rare   Drug use: Yes    Types: Marijuana    Comment: Cannabis oil   Sexual activity: Yes  Other Topics Concern  Not on file  Social History Narrative   Not on file   Social Determinants of Health   Financial Resource Strain: Low Risk  (01/24/2022)   Overall Financial Resource Strain (CARDIA)    Difficulty of Paying Living Expenses: Not hard at all  Food Insecurity: No Food Insecurity (01/24/2022)   Hunger Vital Sign    Worried About Running Out of Food in the Last Year: Never true    Ran Out of Food in the Last Year: Never true  Transportation Needs: No Transportation Needs (01/24/2022)   PRAPARE - Administrator, Civil Service (Medical): No    Lack of Transportation (Non-Medical): No  Physical Activity: Insufficiently Active (01/24/2022)   Exercise Vital Sign    Days of Exercise per Week: 4 days    Minutes of  Exercise per Session: 30 min  Stress: Stress Concern Present (01/24/2022)   [employer] of Occupational Health - Occupational Stress Questionnaire    Feeling of Stress : Very much  Social Connections: Moderately Isolated (01/24/2022)   Social Connection and Isolation Panel [NHANES]    Frequency of Communication with Friends and Family: More than three times a week    Frequency of Social Gatherings with Friends and Family: More than three times a week    Attends Religious Services: Never    Database administrator or Organizations: No    Attends Banker Meetings: Never    Marital Status: Married  Catering manager Violence: Not At Risk (01/24/2022)   Humiliation, Afraid, Rape, and Kick questionnaire    Fear of Current or Ex-Partner: No    Emotionally Abused: No    Physically Abused: No    Sexually Abused: No     Constitutional: Patient reports chronic fatigue.  Denies fever, malaise, headache or abrupt weight changes.  Respiratory: Denies difficulty breathing, shortness of breath, cough or sputum production.   Cardiovascular: Denies chest pain, chest tightness, palpitations or swelling in the hands or feet.  Musculoskeletal: Patient reports chronic joint and muscle pain, difficulty with gait.  Denies decrease in range of motion, or joint swelling.  Skin: Denies redness, rashes, lesions or ulcercations.  Neurological: Denies dizziness, difficulty with memory, difficulty with speech or problems with balance and coordination.  Psych: Patient has a history of anxiety and depression.  Denies SI/HI.  No other specific complaints in a complete review of systems (except as listed in HPI above).  Objective:   Physical Exam  BP (!) 144/82 (BP Location: Left Arm, Patient Position: Sitting, Cuff Size: Normal)   Pulse 72   Temp (!) 96.8 F (36 C) (Temporal)   Wt 207 lb (93.9 kg)   LMP  (LMP Unknown)   SpO2 97%   BMI 32.42 kg/m   Wt Readings from Last 3 Encounters:   12/03/22 206 lb (93.4 kg)  09/05/22 210 lb 9.6 oz (95.5 kg)  08/16/22 210 lb (95.3 kg)    General: Appears her stated age, obese in NAD. Cardiovascular: Normal rate and rhythm.  Pulmonary/Chest: Normal effort and positive vesicular breath sounds. No respiratory distress. No wheezes, rales or ronchi noted.  Musculoskeletal: Normal flexion and extension of the right knee.  Joint enlargement noted of the right knee.  Generalized pain with palpation of the right knee.  Gait slow today with use of cane. Neurological: Alert and oriented. Psychiatric: She appears anxious and agitated.  Tearful at times.  Judgment and thought content normal.     BMET    Component Value Date/Time  NA 140 08/16/2022 1430   K 4.0 08/16/2022 1430   CL 103 08/16/2022 1430   CO2 28 08/16/2022 1430   GLUCOSE 91 08/16/2022 1430   BUN 17 08/16/2022 1430   CREATININE 0.70 08/16/2022 1430   CALCIUM 9.6 08/16/2022 1430   GFRNONAA 96 12/11/2020 0844   GFRAA 112 12/11/2020 0844    Lipid Panel     Component Value Date/Time   CHOL 171 03/21/2022 0928   TRIG 152 (H) 03/21/2022 0928   HDL 39 (L) 03/21/2022 0928   CHOLHDL 4.4 03/21/2022 0928   VLDL 63.0 (H) 12/13/2019 1254   LDLCALC 105 (H) 03/21/2022 0928    CBC    Component Value Date/Time   WBC 9.9 08/16/2022 1430   RBC 4.38 08/16/2022 1430   HGB 13.5 08/16/2022 1430   HCT 39.7 08/16/2022 1430   PLT 280 08/16/2022 1430   MCV 90.6 08/16/2022 1430   MCH 30.8 08/16/2022 1430   MCHC 34.0 08/16/2022 1430   RDW 12.5 08/16/2022 1430   LYMPHSABS 2.7 09/09/2011 2110   MONOABS 0.9 09/09/2011 2110   EOSABS 1.0 (H) 09/09/2011 2110   BASOSABS 0.0 09/09/2011 2110    Hgb A1C Lab Results  Component Value Date   HGBA1C 5.3 12/11/2020            Assessment & Plan:   Chronic right knee pain:  Will obtain MRI of the right knee She has an appointment with orthopedics but cannot get in until October Offered prednisone taper however given her  worsening anxiety and depression, she does not feel like this would be beneficial at this time Recommend ice, Voltaren gel, ibuprofen and Tylenol OTC as needed Use hydrocodone only for severe pain  RTC in 3 months for your annual exam Nicki Reaper, NP

## 2023-03-06 ENCOUNTER — Ambulatory Visit: Payer: No Typology Code available for payment source | Admitting: Internal Medicine

## 2023-03-12 ENCOUNTER — Telehealth: Payer: Self-pay

## 2023-03-12 NOTE — Telephone Encounter (Signed)
FYI

## 2023-03-12 NOTE — Telephone Encounter (Signed)
Copied from CRM [id number]. Topic: General - Other >> Mar 12, 2023  9:08 AM Clide Dales wrote: Patient called to let PCP know that she is going to wait to have the MRI on her right knee until after her Ortho appt on Oct. 11th.

## 2023-03-12 NOTE — Telephone Encounter (Signed)
noted 

## 2023-03-20 ENCOUNTER — Other Ambulatory Visit: Payer: Self-pay | Admitting: Internal Medicine

## 2023-03-20 NOTE — Telephone Encounter (Signed)
Medication Refill - Medication: HYDROcodone-acetaminophen (NORCO) 10-325 MG tablet   Pt will be completely out tonight. Says she initially requested this Tuesday via MyChart.   Has the patient contacted their pharmacy? Yes.   (Agent: If no, request that the patient contact the pharmacy for the refill. If patient does not wish to contact the pharmacy document the reason why and proceed with request.) (Agent: If yes, when and what did the pharmacy advise?)  Preferred Pharmacy (with phone number or street name):  [facility] - [place], [place] - [address]  [address] [place] [address]  Phone: [phone] Fax: [phone]   Has the patient been seen for an appointment in the last year OR does the patient have an upcoming appointment? Yes.    Agent: Please be advised that RX refills may take up to 3 business days. We ask that you follow-up with your pharmacy.

## 2023-03-21 MED ORDER — HYDROCODONE-ACETAMINOPHEN 10-325 MG PO TABS: 1.0000 | ORAL_TABLET | Freq: Two times a day (BID) | ORAL | 0 refills | Status: DC

## 2023-03-21 NOTE — Telephone Encounter (Signed)
Requested medication (s) are due for refill today: yes  Requested medication (s) are on the active medication list: yes  Last refill:  02/11/23  Future visit scheduled: yes  Notes to clinic:  Unable to refill per protocol, cannot delegate.      Requested Prescriptions  Pending Prescriptions Disp Refills   HYDROcodone-acetaminophen (NORCO) 10-325 MG tablet 60 tablet 0    Sig: Take 1 tablet by mouth 2 (two) times daily.     Not Delegated - Analgesics:  Opioid Agonist Combinations Failed - 03/20/2023  3:40 PM      Failed - This refill cannot be delegated      Failed - Urine Drug Screen completed in last 360 days      Passed - Valid encounter within last 3 months    Recent Outpatient Visits           3 weeks ago Chronic pain of right knee   [place] [facility] [place], Salvadore Oxford, NP   3 months ago Primary hypertension   [place] [facility] [place], [place] W, NP   6 months ago Ankylosing spondylitis of lumbar region [facility])   [place] [facility], Salvadore Oxford, NP   7 months ago Ankylosing spondylitis of lumbar region [facility])   [place] [facility] [place], Salvadore Oxford, NP   11 months ago Achilles tendinitis of left lower extremity   [place] [facility] [place], Salvadore Oxford, NP       Future Appointments             In 2 months Baity, Salvadore Oxford, NP [facility]

## 2023-03-28 DIAGNOSIS — M25561 Pain in right knee: Secondary | ICD-10-CM | POA: Diagnosis not present

## 2023-03-28 DIAGNOSIS — M170 Bilateral primary osteoarthritis of knee: Secondary | ICD-10-CM | POA: Diagnosis not present

## 2023-03-28 DIAGNOSIS — M7662 Achilles tendinitis, left leg: Secondary | ICD-10-CM | POA: Diagnosis not present

## 2023-03-28 DIAGNOSIS — M25562 Pain in left knee: Secondary | ICD-10-CM | POA: Diagnosis not present

## 2023-04-17 ENCOUNTER — Ambulatory Visit: Payer: Self-pay

## 2023-04-17 NOTE — Telephone Encounter (Signed)
     Chief Complaint: Abdominal pain, "al  over" Bloating. "I have IBS." Symptoms: "For a long time, getting worse." Frequency: Several months Pertinent Negatives: Patient denies  Disposition: [] ED /[] Urgent Care (no appt availability in office) / [x] Appointment(In office/virtual)/ []  [facility]/ [] Home Care/ [] Refused Recommended Disposition /[] [facility]/ []  Follow-up with PCP Additional Notes: Will call back for worsening of symptoms. Agrees with appointment.  Reason for Disposition  Abdominal pain is a chronic symptom (recurrent or ongoing AND present > 4 weeks)  Answer Assessment - Initial Assessment Questions 1. LOCATION: "Where does it hurt?"      All over 2. RADIATION: "Does the pain shoot anywhere else?" (e.g., chest, back)     No 3. ONSET: "When did the pain begin?" (e.g., minutes, hours or days ago)      Months 4. SUDDEN: "Gradual or sudden onset?"     Gradual 5. PATTERN "Does the pain come and go, or is it constant?"    - If it comes and goes: "How long does it last?" "Do you have pain now?"     (Note: Comes and goes means the pain is intermittent. It goes away completely between bouts.)    - If constant: "Is it getting better, staying the same, or getting worse?"      (Note: Constant means the pain never goes away completely; most serious pain is constant and gets worse.)      Constant 6. SEVERITY: "How bad is the pain?"  (e.g., Scale 1-10; mild, moderate, or severe)    - MILD (1-3): Doesn't interfere with normal activities, abdomen soft and not tender to touch.     - MODERATE (4-7): Interferes with normal activities or awakens from sleep, abdomen tender to touch.     - SEVERE (8-10): Excruciating pain, doubled over, unable to do any normal activities.       7 7. RECURRENT SYMPTOM: "Have you ever had this type of stomach pain before?" If Yes, ask: "When was the last time?" and "What happened that time?"      Yes 8. CAUSE: "What do you think is  causing the stomach pain?"     IBS 9. RELIEVING/AGGRAVATING FACTORS: "What makes it better or worse?" (e.g., antacids, bending or twisting motion, bowel movement)     No 10. OTHER SYMPTOMS: "Do you have any other symptoms?" (e.g., back pain, diarrhea, fever, urination pain, vomiting)       Bloating, IBS 11. PREGNANCY: "Is there any chance you are pregnant?" "When was your last menstrual period?"       No  Protocols used: Abdominal Pain - [facility]

## 2023-04-21 ENCOUNTER — Ambulatory Visit (INDEPENDENT_AMBULATORY_CARE_PROVIDER_SITE_OTHER): Payer: No Typology Code available for payment source | Admitting: Internal Medicine

## 2023-04-21 ENCOUNTER — Encounter: Payer: Self-pay | Admitting: Internal Medicine

## 2023-04-21 VITALS — BP 146/82 | HR 78 | Ht 67.0 in | Wt 212.0 lb

## 2023-04-21 DIAGNOSIS — R110 Nausea: Secondary | ICD-10-CM | POA: Diagnosis not present

## 2023-04-21 DIAGNOSIS — K529 Noninfective gastroenteritis and colitis, unspecified: Secondary | ICD-10-CM | POA: Diagnosis not present

## 2023-04-21 DIAGNOSIS — R140 Abdominal distension (gaseous): Secondary | ICD-10-CM

## 2023-04-21 DIAGNOSIS — R1013 Epigastric pain: Secondary | ICD-10-CM | POA: Diagnosis not present

## 2023-04-21 DIAGNOSIS — R350 Frequency of micturition: Secondary | ICD-10-CM

## 2023-04-21 LAB — POCT URINALYSIS DIPSTICK
Bilirubin, UA: NEGATIVE
Glucose, UA: NEGATIVE
Ketones, UA: NEGATIVE
Nitrite, UA: NEGATIVE
Protein, UA: NEGATIVE
Spec Grav, UA: 1.015 (ref 1.010–1.025)
Urobilinogen, UA: 0.2 U/dL
pH, UA: 6 (ref 5.0–8.0)

## 2023-04-21 MED ORDER — KETOROLAC TROMETHAMINE 30 MG/ML IJ SOLN
30.0000 mg | Freq: Once | INTRAMUSCULAR | Status: AC
  Administered 2023-04-21: 30 mg via INTRAMUSCULAR

## 2023-04-21 NOTE — Patient Instructions (Signed)
 Abdominal Pain, Adult    Many things can cause belly (abdominal) pain. In most cases, belly pain is not a serious problem and can be watched and treated at home. But in some cases, it can be serious.  Your doctor will try to find the cause of your belly pain.  Follow these instructions at home:  Medicines  Take over-the-counter and prescription medicines only as told by your doctor.  Do not take medicines that help you poop (laxatives) unless told by your doctor.  General instructions  Watch your belly pain for any changes. Tell your doctor if the pain gets worse.  Drink enough fluid to keep your pee (urine) pale yellow.  Contact a doctor if:  Your belly pain changes or gets worse.  You have very bad cramping or bloating in your belly.  You vomit.  Your pain gets worse with meals, after eating, or with certain foods.  You have trouble pooping or have watery poop for more than 2-3 days.  You are not hungry, or you lose weight without trying.  You have signs of not getting enough fluid or water (dehydration). These may include:  Dark pee, very little pee, or no pee.  Cracked lips or dry mouth.  Feeling sleepy or weak.  You have pain when you pee or poop.  Your belly pain wakes you up at night.  You have blood in your pee.  You have a fever.  Get help right away if:  You cannot stop vomiting.  Your pain is only in one part of your belly, like on the right side.  You have bloody or black poop, or poop that looks like tar.  You have trouble breathing.  You have chest pain.  These symptoms may be an emergency. Get help right away. Call 911.  Do not wait to see if the symptoms will go away.  Do not drive yourself to the hospital.  This information is not intended to replace advice given to you by your health care provider. Make sure you discuss any questions you have with your health care provider.  Document Revised: 03/20/2022 Document Reviewed: 03/20/2022  Elsevier Patient Education  2024 [employer].

## 2023-04-21 NOTE — Progress Notes (Signed)
Subjective:    Patient ID: Belinda Calderon, female    DOB: 1960/07/26, 62 y.o.   MRN: [id number]  HPI  Discussed the use of AI scribe software for clinical note transcription with the patient, who gave verbal consent to proceed.  The patient presents with a chief complaint of persistent and intense abdominal pain, which she has been experiencing for approximately two weeks. She initially attributed the discomfort to a severe episode of Irritable Bowel Syndrome (IBS), but the severity of this episode is unprecedented. The pain is accompanied by significant bloating and weight gain, despite a decrease in food intake. The patient describes the pain as being so intense that touching her stomach induces cold chills. The discomfort is primarily located in the upper abdomen but also radiates to the lower abdomen, giving the sensation of inflamed ovaries.  The patient also reports increased frequency of bowel movements, which is a departure from her usual pattern. However, these are not diarrheal in nature and there is no reported blood in the stool. The patient has a history of hemorrhoids and has previously undergone a colon guard test, which returned normal results.  In addition to the abdominal symptoms, the patient reports increased urination, which she attributes to increased water intake. She also describes a sensation of not being able to fully empty her colon, a symptom she associates with her IBS.  The patient has been attempting to manage her symptoms with holistic remedies, including the consumption of ginger tea and holy oil. She reports no improvement in symptoms with food intake; in fact, eating seems to exacerbate the discomfort and induce nausea. The patient has even attempted fasting, which has not provided any relief.   Review of Systems     Past Medical History:  Diagnosis Date   Ankylosing spondylitis (HCC)    Degenerative disc disease    Fibromyalgia    Hypertension    IBS  (irritable bowel syndrome)     Current Outpatient Medications  Medication Sig Dispense Refill   amLODipine (NORVASC) 5 MG tablet TAKE 1 TABLET BY MOUTH EVERY DAY 90 tablet 1   cholecalciferol (VITAMIN D3) 25 MCG (1000 UNIT) tablet Take 1,000 Units by mouth daily.     HYDROcodone-acetaminophen (NORCO) 10-325 MG tablet Take 1 tablet by mouth 2 (two) times daily. 60 tablet 0   lisinopril-hydrochlorothiazide (ZESTORETIC) 20-12.5 MG tablet TAKE 2 TABLETS BY MOUTH EVERY DAY 180 tablet 1   No current facility-administered medications for this visit.    Allergies  Allergen Reactions   Sulfa Antibiotics Hives and Swelling   Ibuprofen     GI IRRITATION    Family History  Problem Relation Age of Onset   Diabetes Mother    Arthritis Mother    Arthritis Father    Hyperlipidemia Father    Stroke Father    Hypertension Father    Diabetes Father    Arthritis Maternal Grandmother    Arthritis Maternal Grandfather    Arthritis Paternal Grandmother    Heart disease Paternal Grandmother    Stroke Paternal Grandmother    Hypertension Paternal Grandmother    Diabetes Paternal Grandmother    Arthritis Paternal Grandfather    Hypertension Paternal Grandfather    Diabetes Paternal Grandfather     Social History   Socioeconomic History   Marital status: Married    Spouse name: Gwynneth Fabio   Number of children: 3   Years of education: Not on file   Highest education level: Not on file  Occupational  History   Occupation: Disable  Tobacco Use   Smoking status: Never   Smokeless tobacco: Never  Vaping Use   Vaping status: Some Days   Substances: Mixture of cannabinoids  Substance and Sexual Activity   Alcohol use: Yes    Comment: rare   Drug use: Yes    Types: Marijuana    Comment: Cannabis oil   Sexual activity: Yes  Other Topics Concern   Not on file  Social History Narrative   Not on file   Social Determinants of Health   Financial Resource Strain: Low Risk  (01/24/2022)    Overall Financial Resource Strain (CARDIA)    Difficulty of Paying Living Expenses: Not hard at all  Food Insecurity: No Food Insecurity (01/24/2022)   Hunger Vital Sign    Worried About Running Out of Food in the Last Year: Never true    Ran Out of Food in the Last Year: Never true  Transportation Needs: No Transportation Needs (01/24/2022)   PRAPARE - Administrator, Civil Service (Medical): No    Lack of Transportation (Non-Medical): No  Physical Activity: Insufficiently Active (01/24/2022)   Exercise Vital Sign    Days of Exercise per Week: 4 days    Minutes of Exercise per Session: 30 min  Stress: Stress Concern Present (01/24/2022)   [employer] of Occupational Health - Occupational Stress Questionnaire    Feeling of Stress : Very much  Social Connections: Moderately Isolated (01/24/2022)   Social Connection and Isolation Panel [NHANES]    Frequency of Communication with Friends and Family: More than three times a week    Frequency of Social Gatherings with Friends and Family: More than three times a week    Attends Religious Services: Never    Database administrator or Organizations: No    Attends Banker Meetings: Never    Marital Status: Married  Catering manager Violence: Not At Risk (01/24/2022)   Humiliation, Afraid, Rape, and Kick questionnaire    Fear of Current or Ex-Partner: No    Emotionally Abused: No    Physically Abused: No    Sexually Abused: No     Constitutional: Patient reports chronic fatigue.  Denies fever, malaise, headache or abrupt weight changes.  Respiratory: Denies difficulty breathing, shortness of breath, cough or sputum production.   Cardiovascular: Denies chest pain, chest tightness, palpitations or swelling in the hands or feet.  Gastrointestinal: Pt reports nausea, abdominal pain, bloating, frequent stools and hemorrhoids. Denies diarrhea or blood in the stool.  GU: Patient reports urinary frequency.  Denies  urgency, pain with urination, burning sensation, blood in urine, odor or discharge. Musculoskeletal: Pt reports chronic joint and muscle pain. Denies decrease in range of motion, difficulty with gait, or joint swelling.   No other specific complaints in a complete review of systems (except as listed in HPI above).  Objective:   Physical Exam BP (!) 146/82   Pulse 78   Ht 5\' 7"  (1.702 m)   Wt 212 lb (96.2 kg)   LMP  (LMP Unknown)   SpO2 97%   BMI 33.20 kg/m   Wt Readings from Last 3 Encounters:  02/27/23 207 lb (93.9 kg)  12/03/22 206 lb (93.4 kg)  09/05/22 210 lb 9.6 oz (95.5 kg)    General: Appears her stated age, obese in NAD. Cardiovascular: Normal rate and rhythm.  Pulmonary/Chest: Normal effort and positive vesicular breath sounds. No respiratory distress. No wheezes, rales or ronchi noted.  Abdomen: Soft  and generally tender. Normal bowel sounds. No distention or masses noted.  Musculoskeletal:  No difficulty with gait.  Neurological: Alert and oriented.  Coordination normal.    BMET    Component Value Date/Time   NA 140 08/16/2022 1430   K 4.0 08/16/2022 1430   CL 103 08/16/2022 1430   CO2 28 08/16/2022 1430   GLUCOSE 91 08/16/2022 1430   BUN 17 08/16/2022 1430   CREATININE 0.70 08/16/2022 1430   CALCIUM 9.6 08/16/2022 1430   GFRNONAA 96 12/11/2020 0844   GFRAA 112 12/11/2020 0844    Lipid Panel     Component Value Date/Time   CHOL 171 03/21/2022 0928   TRIG 152 (H) 03/21/2022 0928   HDL 39 (L) 03/21/2022 0928   CHOLHDL 4.4 03/21/2022 0928   VLDL 63.0 (H) 12/13/2019 1254   LDLCALC 105 (H) 03/21/2022 0928    CBC    Component Value Date/Time   WBC 9.9 08/16/2022 1430   RBC 4.38 08/16/2022 1430   HGB 13.5 08/16/2022 1430   HCT 39.7 08/16/2022 1430   PLT 280 08/16/2022 1430   MCV 90.6 08/16/2022 1430   MCH 30.8 08/16/2022 1430   MCHC 34.0 08/16/2022 1430   RDW 12.5 08/16/2022 1430   LYMPHSABS 2.7 09/09/2011 2110   MONOABS 0.9 09/09/2011 2110    EOSABS 1.0 (H) 09/09/2011 2110   BASOSABS 0.0 09/09/2011 2110    Hgb A1C Lab Results  Component Value Date   HGBA1C 5.3 12/11/2020            Assessment & Plan:   Assessment and Plan    Abdominal Pain, bloating, nausea, frequent stools: Severe, persistent abdominal pain for 2 weeks, associated with bloating, increased bowel movements, and postprandial worsening. No reflux, hematochezia, or diarrhea. History of IBS. Differential includes gastritis, peptic ulcer disease, H. pylori infection, and IBS exacerbation. -Order comprehensive metabolic panel to assess liver and kidney function. -Order pancreatic function tests. -Perform H. pylori breath test. -Collect urine sample for urinalysis. -Discussed potential use of a proton pump inhibitor (PPI) for symptomatic relief, pending lab results. -Toradol 20 mg IM x 1  Increased Urination Increased frequency of urination, possibly related to increased water intake. -Assess urine sample for potential urinary tract infection or other abnormalities. -Will send urine culture  RTC in 1 month for your annual exam Nicki Reaper, NP

## 2023-04-22 LAB — URINE CULTURE
MICRO NUMBER:: 15682537
SPECIMEN QUALITY:: ADEQUATE

## 2023-04-23 ENCOUNTER — Other Ambulatory Visit: Payer: Self-pay | Admitting: Internal Medicine

## 2023-04-23 LAB — COMPLETE METABOLIC PANEL WITH GFR
AG Ratio: 1.7 (calc) (ref 1.0–2.5)
ALT: 21 U/L (ref 6–29)
AST: 19 U/L (ref 10–35)
Albumin: 4.5 g/dL (ref 3.6–5.1)
Alkaline phosphatase (APISO): 94 U/L (ref 37–153)
BUN: 16 mg/dL (ref 7–25)
CO2: 28 mmol/L (ref 20–32)
Calcium: 9.7 mg/dL (ref 8.6–10.4)
Chloride: 103 mmol/L (ref 98–110)
Creat: 0.77 mg/dL (ref 0.50–1.05)
Globulin: 2.7 g/dL (ref 1.9–3.7)
Glucose, Bld: 107 mg/dL — ABNORMAL HIGH (ref 65–99)
Potassium: 4.1 mmol/L (ref 3.5–5.3)
Sodium: 141 mmol/L (ref 135–146)
Total Bilirubin: 0.4 mg/dL (ref 0.2–1.2)
Total Protein: 7.2 g/dL (ref 6.1–8.1)
eGFR: 88 mL/min/{1.73_m2} (ref >=60)

## 2023-04-23 LAB — H. PYLORI BREATH TEST: H. pylori Breath Test: NOT DETECTED

## 2023-04-23 LAB — LIPASE: Lipase: 30 U/L (ref 7–60)

## 2023-04-23 NOTE — Telephone Encounter (Signed)
Medication Refill - Most Recent Primary Care Visit:  Provider: Lorre Munroe  Department: [facility]  Visit Type: SAME DAY  Date: 04/21/2023  Medication: HYDROcodone-acetaminophen (NORCO) 10-325 MG tablet [[id number]]   Has the patient contacted their pharmacy? Yes   (Agent: If yes, when and what did the pharmacy advise?) Contact pcp   Has the prescription been filled recently? Yes  Is the patient out of the medication? No close to being out   Has the patient been seen for an appointment in the last year OR does the patient have an upcoming appointment? Yes  Can we respond through MyChart? No Call patient   Pharmacy: [facility] - [place], [place] - [address]   Agent: Please be advised that Rx refills may take up to 3 business days. We ask that you follow-up with your pharmacy.

## 2023-04-24 ENCOUNTER — Other Ambulatory Visit: Payer: Self-pay | Admitting: Internal Medicine

## 2023-04-24 MED ORDER — HYDROCODONE-ACETAMINOPHEN 10-325 MG PO TABS: 1.0000 | ORAL_TABLET | Freq: Two times a day (BID) | ORAL | 0 refills | Status: DC

## 2023-04-24 NOTE — Telephone Encounter (Signed)
Requested medications are due for refill today.  yes  Requested medications are on the active medications list.  yes  Last refill. 03/21/2023 #60 0 rf  Future visit scheduled.   yes  Notes to clinic.  Refill not delegated.    Requested Prescriptions  Pending Prescriptions Disp Refills   HYDROcodone-acetaminophen (NORCO) 10-325 MG tablet 60 tablet 0    Sig: Take 1 tablet by mouth 2 (two) times daily.     Not Delegated - Analgesics:  Opioid Agonist Combinations Failed - 04/23/2023 11:41 AM      Failed - This refill cannot be delegated      Failed - Urine Drug Screen completed in last 360 days      Passed - Valid encounter within last 3 months    Recent Outpatient Visits           3 days ago Epigastric abdominal pain   [place] [facility] [place], Salvadore Oxford, NP   1 month ago Chronic pain of right knee   [place] [facility] [place], Salvadore Oxford, NP   4 months ago Primary hypertension   [place] [facility] [place], Salvadore Oxford, NP   7 months ago Ankylosing spondylitis of lumbar region [facility])   [place] [facility] [place], Salvadore Oxford, NP   8 months ago Ankylosing spondylitis of lumbar region [facility])   [place] [facility] [place], Salvadore Oxford, NP       Future Appointments             In 1 month Ba[place], Salvadore Oxford, NP [place] [facility], [facility]

## 2023-04-24 NOTE — Telephone Encounter (Signed)
Requested medication (s) are due for refill today: No  Requested medication (s) are on the active medication list: yes    Last refill: 04/24/23  #60  0 refills  Future visit scheduled yes 05/27/23  Notes to clinic:Duplicate request sent via Interface. Cannot refuse non-delegated meds.  Requested Prescriptions  Pending Prescriptions Disp Refills   HYDROcodone-acetaminophen (NORCO) 10-325 MG tablet [Pharmacy Med Name: HYDROCODONE BITARTRATE/ACETAMINOPHEN 10-325MG  TABLET] 60 tablet 0    Sig: TAKE 1 TABLET BY MOUTH TWICE A DAY     Not Delegated - Analgesics:  Opioid Agonist Combinations Failed - 04/24/2023  9:15 AM      Failed - This refill cannot be delegated      Failed - Urine Drug Screen completed in last 360 days      Passed - Valid encounter within last 3 months    Recent Outpatient Visits           3 days ago Epigastric abdominal pain   [place] [facility] [place], Salvadore Oxford, NP   1 month ago Chronic pain of right knee   [place] [facility] [place], Salvadore Oxford, NP   4 months ago Primary hypertension   [place] [facility] [place], [place] W, NP   7 months ago Ankylosing spondylitis of lumbar region [facility])   [place] [facility] [place], Salvadore Oxford, NP   8 months ago Ankylosing spondylitis of lumbar region [facility] [place])   [place] [facility] [place], Salvadore Oxford, NP       Future Appointments             In 1 month Baity, Salvadore Oxford, NP [place] [facility], [facility]

## 2023-05-18 ENCOUNTER — Other Ambulatory Visit: Payer: Self-pay | Admitting: Internal Medicine

## 2023-05-21 NOTE — Telephone Encounter (Signed)
Requested Prescriptions  Pending Prescriptions Disp Refills   lisinopril-hydrochlorothiazide (ZESTORETIC) 20-12.5 MG tablet [Pharmacy Med Name: LISINOPRIL-HCTZ 20-12.5 MG TAB] 180 tablet 1    Sig: TAKE 2 TABLETS BY MOUTH EVERY DAY     Cardiovascular:  ACEI + Diuretic Combos Failed - 05/18/2023  8:57 AM      Failed - Last BP in normal range    BP Readings from Last 1 Encounters:  04/21/23 (!) 146/82         Passed - Na in normal range and within 180 days    Sodium  Date Value Ref Range Status  04/21/2023 141 135 - 146 mmol/L Final         Passed - K in normal range and within 180 days    Potassium  Date Value Ref Range Status  04/21/2023 4.1 3.5 - 5.3 mmol/L Final         Passed - Cr in normal range and within 180 days    Creat  Date Value Ref Range Status  04/21/2023 0.77 0.50 - 1.05 mg/dL Final         Passed - eGFR is 30 or above and within 180 days    GFR, Est African American  Date Value Ref Range Status  12/11/2020 112 > OR = 60 mL/min/1.49m2 Final   GFR, Est Non African American  Date Value Ref Range Status  12/11/2020 96 > OR = 60 mL/min/1.63m2 Final   GFR  Date Value Ref Range Status  07/26/2020 93.23 >60.00 mL/min Final    Comment:    Calculated using the CKD-EPI Creatinine Equation (2021)   eGFR  Date Value Ref Range Status  04/21/2023 88 > OR = 60 mL/min/1.21m2 Final         Passed - Patient is not pregnant      Passed - Valid encounter within last 6 months    Recent Outpatient Visits           1 month ago Epigastric abdominal pain   [place] [facility] [place], Salvadore Oxford, NP   2 months ago Chronic pain of right knee   [place] [facility] [place], Salvadore Oxford, NP   5 months ago Primary hypertension   [place] [facility] [place], [place] W, NP   8 months ago Ankylosing spondylitis of lumbar region [facility])   [place] [facility] [place], [place] W, NP   9 months ago Ankylosing  spondylitis of lumbar region [facility])    [facility] [place], Salvadore Oxford, NP       Future Appointments             In 6 days [place], Salvadore Oxford, NP  [facility]             amLODipine (NORVASC) 5 MG tablet [Pharmacy Med Name: AMLODIPINE BESYLATE 5 MG TAB] 90 tablet 1    Sig: TAKE 1 TABLET BY MOUTH EVERY DAY     Cardiovascular: Calcium Channel Blockers 2 Failed - 05/18/2023  8:57 AM      Failed - Last BP in normal range    BP Readings from Last 1 Encounters:  04/21/23 (!) 146/82         Passed - Last Heart Rate in normal range    Pulse Readings from Last 1 Encounters:  04/21/23 78         Passed - Valid encounter within last 6 months  Recent Outpatient Visits           1 month ago Epigastric abdominal pain   [place] [facility] [place], [place], NP   2 months ago Chronic pain of right knee   [place] [facility] [place], Salvadore Oxford, NP   5 months ago Primary hypertension   [place] [facility] [place], [place], NP   8 months ago Ankylosing spondylitis of lumbar [facility])   [place] [facility] [place], Salvadore Oxford, NP   9 months ago Ankylosing spondylitis of lumbar region [facility])    [facility] [place], Salvadore Oxford, NP       Future Appointments             In 6 days Baity, Salvadore Oxford, NP  [facility], [facility]

## 2023-05-27 ENCOUNTER — Ambulatory Visit (INDEPENDENT_AMBULATORY_CARE_PROVIDER_SITE_OTHER): Payer: No Typology Code available for payment source | Admitting: Internal Medicine

## 2023-05-27 ENCOUNTER — Encounter: Payer: Self-pay | Admitting: Internal Medicine

## 2023-05-27 VITALS — BP 132/76 | Ht 67.0 in | Wt 214.0 lb

## 2023-05-27 DIAGNOSIS — E6609 Other obesity due to excess calories: Secondary | ICD-10-CM

## 2023-05-27 DIAGNOSIS — E782 Mixed hyperlipidemia: Secondary | ICD-10-CM | POA: Diagnosis not present

## 2023-05-27 DIAGNOSIS — Z6833 Body mass index (BMI) 33.0-33.9, adult: Secondary | ICD-10-CM

## 2023-05-27 DIAGNOSIS — Z0001 Encounter for general adult medical examination with abnormal findings: Secondary | ICD-10-CM | POA: Diagnosis not present

## 2023-05-27 DIAGNOSIS — R739 Hyperglycemia, unspecified: Secondary | ICD-10-CM | POA: Diagnosis not present

## 2023-05-27 DIAGNOSIS — E66811 Obesity, class 1: Secondary | ICD-10-CM | POA: Diagnosis not present

## 2023-05-27 NOTE — Patient Instructions (Signed)
Health Maintenance for Postmenopausal Women Menopause is a normal process in which your ability to get pregnant comes to an end. This process happens slowly over many months or years, usually between the ages of 48 and 55. Menopause is complete when you have missed your menstrual period for 12 months. It is important to talk with your health care provider about some of the most common conditions that affect women after menopause (postmenopausal women). These include heart disease, cancer, and bone loss (osteoporosis). Adopting a healthy lifestyle and getting preventive care can help to promote your health and wellness. The actions you take can also lower your chances of developing some of these common conditions. What are the signs and symptoms of menopause? During menopause, you may have the following symptoms: Hot flashes. These can be moderate or severe. Night sweats. Decrease in sex drive. Mood swings. Headaches. Tiredness (fatigue). Irritability. Memory problems. Problems falling asleep or staying asleep. Talk with your health care provider about treatment options for your symptoms. Do I need hormone replacement therapy? Hormone replacement therapy is effective in treating symptoms that are caused by menopause, such as hot flashes and night sweats. Hormone replacement carries certain risks, especially as you become older. If you are thinking about using estrogen or estrogen with progestin, discuss the benefits and risks with your health care provider. How can I reduce my risk for heart disease and stroke? The risk of heart disease, heart attack, and stroke increases as you age. One of the causes may be a change in the body's hormones during menopause. This can affect how your body uses dietary fats, triglycerides, and cholesterol. Heart attack and stroke are medical emergencies. There are many things that you can do to help prevent heart disease and stroke. Watch your blood pressure High  blood pressure causes heart disease and increases the risk of stroke. This is more likely to develop in people who have high blood pressure readings or are overweight. Have your blood pressure checked: Every 3-5 years if you are 18-39 years of age. Every year if you are 40 years old or older. Eat a healthy diet  Eat a diet that includes plenty of vegetables, fruits, low-fat dairy products, and lean protein. Do not eat a lot of foods that are high in solid fats, added sugars, or sodium. Get regular exercise Get regular exercise. This is one of the most important things you can do for your health. Most adults should: Try to exercise for at least 150 minutes each week. The exercise should increase your heart rate and make you sweat (moderate-intensity exercise). Try to do strengthening exercises at least twice each week. Do these in addition to the moderate-intensity exercise. Spend less time sitting. Even light physical activity can be beneficial. Other tips Work with your health care provider to achieve or maintain a healthy weight. Do not use any products that contain nicotine or tobacco. These products include cigarettes, chewing tobacco, and vaping devices, such as e-cigarettes. If you need help quitting, ask your health care provider. Know your numbers. Ask your health care provider to check your cholesterol and your blood sugar (glucose). Continue to have your blood tested as directed by your health care provider. Do I need screening for cancer? Depending on your health history and family history, you may need to have cancer screenings at different stages of your life. This may include screening for: Breast cancer. Cervical cancer. Lung cancer. Colorectal cancer. What is my risk for osteoporosis? After menopause, you may be   at increased risk for osteoporosis. Osteoporosis is a condition in which bone destruction happens more quickly than new bone creation. To help prevent osteoporosis or  the bone fractures that can happen because of osteoporosis, you may take the following actions: If you are 19-50 years old, get at least 1,000 mg of calcium and at least 600 international units (IU) of vitamin D per day. If you are older than age 50 but younger than age 70, get at least 1,200 mg of calcium and at least 600 international units (IU) of vitamin D per day. If you are older than age 70, get at least 1,200 mg of calcium and at least 800 international units (IU) of vitamin D per day. Smoking and drinking excessive alcohol increase the risk of osteoporosis. Eat foods that are rich in calcium and vitamin D, and do weight-bearing exercises several times each week as directed by your health care provider. How does menopause affect my mental health? Depression may occur at any age, but it is more common as you become older. Common symptoms of depression include: Feeling depressed. Changes in sleep patterns. Changes in appetite or eating patterns. Feeling an overall lack of motivation or enjoyment of activities that you previously enjoyed. Frequent crying spells. Talk with your health care provider if you think that you are experiencing any of these symptoms. General instructions See your health care provider for regular wellness exams and vaccines. This may include: Scheduling regular health, dental, and eye exams. Getting and maintaining your vaccines. These include: Influenza vaccine. Get this vaccine each year before the flu season begins. Pneumonia vaccine. Shingles vaccine. Tetanus, diphtheria, and pertussis (Tdap) booster vaccine. Your health care provider may also recommend other immunizations. Tell your health care provider if you have ever been abused or do not feel safe at home. Summary Menopause is a normal process in which your ability to get pregnant comes to an end. This condition causes hot flashes, night sweats, decreased interest in sex, mood swings, headaches, or lack  of sleep. Treatment for this condition may include hormone replacement therapy. Take actions to keep yourself healthy, including exercising regularly, eating a healthy diet, watching your weight, and checking your blood pressure and blood sugar levels. Get screened for cancer and depression. Make sure that you are up to date with all your vaccines. This information is not intended to replace advice given to you by your health care provider. Make sure you discuss any questions you have with your health care provider. Document Revised: 10/23/2020 Document Reviewed: 10/23/2020 Elsevier Patient Education  2024 Elsevier Inc.  

## 2023-05-27 NOTE — Assessment & Plan Note (Signed)
Encouraged diet and exercise for weight loss ?

## 2023-05-27 NOTE — Progress Notes (Signed)
Subjective:    Patient ID: Belinda Calderon, female    DOB: 1961-03-31, 62 y.o.   MRN: [id number]  HPI  Patient presents to clinic today for her annual exam.  Flu: Never Tetanus: 08/2011 COVID: Never Shingrix: Never Pap smear: Hysterectomy Mammogram: 02/2012 Bone density: 02/2012 Colon screening: 10/2022, Cologuard Vision screening: as needed Dentist: as needed  Diet: She does eat meat. She consumes fruits and veggies. She tries to avoid fried foods. She drinks mostly water. Exercise: None  Review of Systems     Past Medical History:  Diagnosis Date   Ankylosing spondylitis (HCC)    Degenerative disc disease    Fibromyalgia    Hypertension    IBS (irritable bowel syndrome)     Current Outpatient Medications  Medication Sig Dispense Refill   amLODipine (NORVASC) 5 MG tablet TAKE 1 TABLET BY MOUTH EVERY DAY 90 tablet 1   cholecalciferol (VITAMIN D3) 25 MCG (1000 UNIT) tablet Take 1,000 Units by mouth daily.     HYDROcodone-acetaminophen (NORCO) 10-325 MG tablet Take 1 tablet by mouth 2 (two) times daily. 60 tablet 0   lisinopril-hydrochlorothiazide (ZESTORETIC) 20-12.5 MG tablet TAKE 2 TABLETS BY MOUTH EVERY DAY 180 tablet 1   No current facility-administered medications for this visit.    Allergies  Allergen Reactions   Sulfa Antibiotics Hives and Swelling   Ibuprofen     GI IRRITATION    Family History  Problem Relation Age of Onset   Diabetes Mother    Arthritis Mother    Arthritis Father    Hyperlipidemia Father    Stroke Father    Hypertension Father    Diabetes Father    Arthritis Maternal Grandmother    Arthritis Maternal Grandfather    Arthritis Paternal Grandmother    Heart disease Paternal Grandmother    Stroke Paternal Grandmother    Hypertension Paternal Grandmother    Diabetes Paternal Grandmother    Arthritis Paternal Grandfather    Hypertension Paternal Grandfather    Diabetes Paternal Grandfather     Social History   Socioeconomic  History   Marital status: Married    Spouse name: Yairis Duddy   Number of children: 3   Years of education: Not on file   Highest education level: Not on file  Occupational History   Occupation: Disable  Tobacco Use   Smoking status: Never   Smokeless tobacco: Never  Vaping Use   Vaping status: Some Days   Substances: Mixture of cannabinoids  Substance and Sexual Activity   Alcohol use: Yes    Comment: rare   Drug use: Yes    Types: Marijuana    Comment: Cannabis oil   Sexual activity: Yes  Other Topics Concern   Not on file  Social History Narrative   Not on file   Social Determinants of Health   Financial Resource Strain: Low Risk  (01/24/2022)   Overall Financial Resource Strain (CARDIA)    Difficulty of Paying Living Expenses: Not hard at all  Food Insecurity: No Food Insecurity (01/24/2022)   Hunger Vital Sign    Worried About Running Out of Food in the Last Year: Never true    Ran Out of Food in the Last Year: Never true  Transportation Needs: No Transportation Needs (01/24/2022)   PRAPARE - Administrator, Civil Service (Medical): No    Lack of Transportation (Non-Medical): No  Physical Activity: Insufficiently Active (01/24/2022)   Exercise Vital Sign    Days of Exercise per  Week: 4 days    Minutes of Exercise per Session: 30 min  Stress: Stress Concern Present (01/24/2022)   [employer] of Occupational Health - Occupational Stress Questionnaire    Feeling of Stress : Very much  Social Connections: Moderately Isolated (01/24/2022)   Social Connection and Isolation Panel [NHANES]    Frequency of Communication with Friends and Family: More than three times a week    Frequency of Social Gatherings with Friends and Family: More than three times a week    Attends Religious Services: Never    Database administrator or Organizations: No    Attends Banker Meetings: Never    Marital Status: Married  Catering manager Violence: Not At Risk  (01/24/2022)   Humiliation, Afraid, Rape, and Kick questionnaire    Fear of Current or Ex-Partner: No    Emotionally Abused: No    Physically Abused: No    Sexually Abused: No     Constitutional: Denies fever, malaise, fatigue, headache or abrupt weight changes.  HEENT: Denies eye pain, eye redness, ear pain, ringing in the ears, wax buildup, runny nose, nasal congestion, bloody nose, or sore throat. Respiratory: Denies difficulty breathing, shortness of breath, cough or sputum production.   Cardiovascular: Denies chest pain, chest tightness, palpitations or swelling in the hands or feet.  Gastrointestinal: Patient reports alternating constipation and diarrhea.  Denies abdominal pain, bloating, or blood in the stool.  GU: Denies urgency, frequency, pain with urination, burning sensation, blood in urine, odor or discharge. Musculoskeletal: Patient reports chronic joint muscle pain.  Denies decrease in range of motion, difficulty with gait, or joint swelling.  Skin: Denies redness, rashes, lesions or ulcercations.  Neurological: Patient reports insomnia.  Denies dizziness, difficulty with memory, difficulty with speech or problems with balance and coordination.  Psych: Patient has a history of anxiety and depression.  Denies SI/HI.  No other specific complaints in a complete review of systems (except as listed in HPI above).  Objective:   Physical Exam  BP 132/76   Ht 5\' 7"  (1.702 m)   Wt 214 lb (97.1 kg)   LMP  (LMP Unknown)   BMI 33.52 kg/m    Wt Readings from Last 3 Encounters:  04/21/23 212 lb (96.2 kg)  02/27/23 207 lb (93.9 kg)  12/03/22 206 lb (93.4 kg)    General: Appears her stated age, obese, in NAD. Skin: Warm, dry and intact.  HEENT: Head: normal shape and size; Eyes: sclera white, no icterus, conjunctiva pink, PERRLA and EOMs intact;  Neck:  Neck supple, trachea midline. No masses, lumps or thyromegaly present.  Cardiovascular: Normal rate and rhythm. S1,S2 noted.   No murmur, rubs or gallops noted. No JVD or BLE edema. No carotid bruits noted. Pulmonary/Chest: Normal effort and positive vesicular breath sounds. No respiratory distress. No wheezes, rales or ronchi noted.  Abdomen: Normal bowel sounds.  Musculoskeletal: Strength 5/5 BUE/BLE. No difficulty with gait.  Neurological: Alert and oriented. Cranial nerves II-XII grossly intact. Coordination normal.  Psychiatric: Mood and affect flat. Behavior is normal. Judgment and thought content normal.    BMET    Component Value Date/Time   NA 141 04/21/2023 1123   K 4.1 04/21/2023 1123   CL 103 04/21/2023 1123   CO2 28 04/21/2023 1123   GLUCOSE 107 (H) 04/21/2023 1123   BUN 16 04/21/2023 1123   CREATININE 0.77 04/21/2023 1123   CALCIUM 9.7 04/21/2023 1123   GFRNONAA 96 12/11/2020 0844   GFRAA 112 12/11/2020 0844  Lipid Panel     Component Value Date/Time   CHOL 171 03/21/2022 0928   TRIG 152 (H) 03/21/2022 0928   HDL 39 (L) 03/21/2022 0928   CHOLHDL 4.4 03/21/2022 0928   VLDL 63.0 (H) 12/13/2019 1254   LDLCALC 105 (H) 03/21/2022 0928    CBC    Component Value Date/Time   WBC 9.9 08/16/2022 1430   RBC 4.38 08/16/2022 1430   HGB 13.5 08/16/2022 1430   HCT 39.7 08/16/2022 1430   PLT 280 08/16/2022 1430   MCV 90.6 08/16/2022 1430   MCH 30.8 08/16/2022 1430   MCHC 34.0 08/16/2022 1430   RDW 12.5 08/16/2022 1430   LYMPHSABS 2.7 09/09/2011 2110   MONOABS 0.9 09/09/2011 2110   EOSABS 1.0 (H) 09/09/2011 2110   BASOSABS 0.0 09/09/2011 2110    Hgb A1C Lab Results  Component Value Date   HGBA1C 5.3 12/11/2020           Assessment & Plan:   Preventative Health Maintenance:  She declines flu shot She declines tetanus booster Encouraged her to get her COVID-vaccine Discussed Shingrix vaccine, she will check coverage with her insurance company and schedule a nurse visit if she would like to have this done She no longer needs Pap smears She declines mammogram and bone  density Colon screening UTD Encouraged her to consume a balanced diet and exercise regimen Advised her to see an eye doctor and dentist annually We will check CBC, c-Met, lipid profile and A1c today  RTC in 6 months, follow-up chronic conditions Nicki Reaper, NP

## 2023-05-28 ENCOUNTER — Other Ambulatory Visit: Payer: Self-pay | Admitting: Internal Medicine

## 2023-05-28 ENCOUNTER — Telehealth: Payer: Self-pay | Admitting: Internal Medicine

## 2023-05-28 LAB — COMPLETE METABOLIC PANEL WITH GFR
AG Ratio: 1.7 (calc) (ref 1.0–2.5)
ALT: 24 U/L (ref 6–29)
AST: 19 U/L (ref 10–35)
Albumin: 4.4 g/dL (ref 3.6–5.1)
Alkaline phosphatase (APISO): 86 U/L (ref 37–153)
BUN: 16 mg/dL (ref 7–25)
CO2: 24 mmol/L (ref 20–32)
Calcium: 9.7 mg/dL (ref 8.6–10.4)
Chloride: 104 mmol/L (ref 98–110)
Creat: 0.67 mg/dL (ref 0.50–1.05)
Globulin: 2.6 g/dL (ref 1.9–3.7)
Glucose, Bld: 105 mg/dL — ABNORMAL HIGH (ref 65–99)
Potassium: 4.1 mmol/L (ref 3.5–5.3)
Sodium: 138 mmol/L (ref 135–146)
Total Bilirubin: 0.4 mg/dL (ref 0.2–1.2)
Total Protein: 7 g/dL (ref 6.1–8.1)
eGFR: 99 mL/min/{1.73_m2} (ref >=60)

## 2023-05-28 LAB — CBC
HCT: 41.4 % (ref 35.0–45.0)
Hemoglobin: 14.1 g/dL (ref 11.7–15.5)
MCH: 30.7 pg (ref 27.0–33.0)
MCHC: 34.1 g/dL (ref 32.0–36.0)
MCV: 90.2 fL (ref 80.0–100.0)
MPV: 10.4 fL (ref 7.5–12.5)
Platelets: 314 10*3/uL (ref 140–400)
RBC: 4.59 10*6/uL (ref 3.80–5.10)
RDW: 12.6 % (ref 11.0–15.0)
WBC: 9.5 10*3/uL (ref 3.8–10.8)

## 2023-05-28 LAB — HEMOGLOBIN A1C
Hgb A1c MFr Bld: 5.9 %{Hb} — ABNORMAL HIGH (ref <5.7)
Mean Plasma Glucose: 123 mg/dL
eAG (mmol/L): 6.8 mmol/L

## 2023-05-28 LAB — LIPID PANEL
Cholesterol: 189 mg/dL (ref <200)
HDL: 39 mg/dL — ABNORMAL LOW (ref >=50)
LDL Cholesterol (Calc): 108 mg/dL — ABNORMAL HIGH
Non-HDL Cholesterol (Calc): 150 mg/dL — ABNORMAL HIGH (ref <130)
Total CHOL/HDL Ratio: 4.8 (calc) (ref <5.0)
Triglycerides: 292 mg/dL — ABNORMAL HIGH (ref <150)

## 2023-05-28 NOTE — Telephone Encounter (Signed)
[facility] called and spoke to [place], Pensions consultant about the refill(s) amlodipine, lisinopril-hydrochlorothiazide requested. Advised both were sent on 05/21/23 #90/1 refill(s). She says they are there and ready for pickup. Patient called, left VM that medications are at the pharmacy, call office or pharmacy if questions regarding message.

## 2023-05-28 NOTE — Telephone Encounter (Signed)
Medication Refill -  Most Recent Primary Care Visit:  Provider: Lorre Munroe  Department: [facility]  Visit Type: PHYSICAL 20  Date: 05/27/2023  Medication: HYDROcodone-acetaminophen (NORCO) 10-325 MG tablet [[id number]]   Has the patient contacted their pharmacy? Yes  (Agent: If yes, when and what did the pharmacy advise?) Contact Office   Is this the correct pharmacy for this prescription? Yes  This is the patient's preferred pharmacy:    [facility] - [place], [place] - [address] [place] [address] Phone: [phone] Fax: [phone]   Has the prescription been filled recently? Yes  Is the patient out of the medication? Yes  Has the patient been seen for an appointment in the last year OR does the patient have an upcoming appointment? Yes  Can we respond through MyChart? Yes  Agent: Please be advised that Rx refills may take up to 3 business days. We ask that you follow-up with your pharmacy.

## 2023-05-28 NOTE — Telephone Encounter (Signed)
Medication Refill -  Most Recent Primary Care Visit:  Provider: Lorre Munroe  Department: [facility]  Visit Type: PHYSICAL 20  Date: 05/27/2023  Medication: amLODipine (NORVASC) 5 MG tablet [[id number]] lisinopril-hydrochlorothiazide (ZESTORETIC) 20-12.5 MG tablet [[id number]]   Has the patient contacted their pharmacy? Yes  (Agent: If yes, when and what did the pharmacy advise?) Contact PCP   Is this the correct pharmacy for this prescription? Yes  This is the patient's preferred pharmacy:  [phone] [place] - [address] [place] [address] Phone: [phone] Fax: [phone]  Has the prescription been filled recently? Yes  Is the patient out of the medication? Yes  Has the patient been seen for an appointment in the last year OR does the patient have an upcoming appointment? Yes  Can we respond through MyChart? Yes  Agent: Please be advised that Rx refills may take up to 3 business days. We ask that you follow-up with your pharmacy.

## 2023-05-29 MED ORDER — HYDROCODONE-ACETAMINOPHEN 10-325 MG PO TABS: 1.0000 | ORAL_TABLET | Freq: Two times a day (BID) | ORAL | 0 refills | Status: DC

## 2023-05-29 NOTE — Telephone Encounter (Signed)
Requested medication (s) are due for refill today: yes  Requested medication (s) are on the active medication list: yes  Last refill:  05/04/23 #60   Future visit scheduled: yes  Notes to clinic:  med not delegated to NT to RF   Requested Prescriptions  Pending Prescriptions Disp Refills   HYDROcodone-acetaminophen (NORCO) 10-325 MG tablet 60 tablet 0    Sig: Take 1 tablet by mouth 2 (two) times daily.     Not Delegated - Analgesics:  Opioid Agonist Combinations Failed - 05/29/2023  8:09 AM      Failed - This refill cannot be delegated      Failed - Urine Drug Screen completed in last 360 days      Passed - Valid encounter within last 3 months    Recent Outpatient Visits           2 days ago Encounter for general adult medical examination with abnormal findings   [place] [facility] [place], Salvadore Oxford, NP   1 month ago Epigastric abdominal pain   [place] [facility] [place], Salvadore Oxford, NP   3 months ago Chronic pain of right knee   [facility] [place], Salvadore Oxford, NP   5 months ago Primary hypertension   [place] [facility] [place], [place] W, NP   8 months ago Ankylosing spondylitis of lumbar region [facility])   [place] [facility] [place], Salvadore Oxford, NP       Future Appointments             In 6 months Baity, Salvadore Oxford, NP [place] [facility], [facility]

## 2023-06-24 ENCOUNTER — Encounter: Payer: Self-pay | Admitting: Internal Medicine

## 2023-06-24 ENCOUNTER — Ambulatory Visit: Payer: No Typology Code available for payment source | Admitting: Internal Medicine

## 2023-06-24 VITALS — BP 134/82 | Ht 67.0 in | Wt 207.0 lb

## 2023-06-24 DIAGNOSIS — F32A Depression, unspecified: Secondary | ICD-10-CM | POA: Diagnosis not present

## 2023-06-24 DIAGNOSIS — M456 Ankylosing spondylitis lumbar region: Secondary | ICD-10-CM | POA: Diagnosis not present

## 2023-06-24 DIAGNOSIS — F419 Anxiety disorder, unspecified: Secondary | ICD-10-CM

## 2023-06-24 DIAGNOSIS — R0789 Other chest pain: Secondary | ICD-10-CM

## 2023-06-24 DIAGNOSIS — G894 Chronic pain syndrome: Secondary | ICD-10-CM

## 2023-06-24 DIAGNOSIS — M797 Fibromyalgia: Secondary | ICD-10-CM | POA: Diagnosis not present

## 2023-06-24 DIAGNOSIS — R202 Paresthesia of skin: Secondary | ICD-10-CM

## 2023-06-24 NOTE — Progress Notes (Signed)
 Subjective:    Patient ID: Belinda Calderon, female    DOB: 11-23-1960, 63 y.o.   MRN: [id number]  HPI  Patient presents to clinic today with complaint of chest pain.  She reports this started about 3 days ago.  She describes the pain as sharp, intense pressure that radiates into her left shoulder and down her left arm as well as into her back.  The pain has been intermittent but she has noticed that it increases with times of stress.  She reports associated nausea.  She denies excessive sweating, vomiting, shortness of breath, jitteriness, weakness, dizziness or syncope.  The pain does not necessarily worsen with exertion.  She has a history of anxiety and depression managed with clonazepam  as needed.  She has been under a lot of stress the last few weeks due to family issues.  She also reports history of fibromyalgia, costochondritis as well as ankylosing spondylitis that could be contributing factors to her pain.  She denies any specific injury to the neck, chest wall or left shoulder.  She has tried turmeric tea with minimal relief of symptoms.  Review of Systems     Past Medical History:  Diagnosis Date   Ankylosing spondylitis (HCC)    Degenerative disc disease    Fibromyalgia    Hypertension    IBS (irritable bowel syndrome)     Current Outpatient Medications  Medication Sig Dispense Refill   amLODipine  (NORVASC ) 5 MG tablet TAKE 1 TABLET BY MOUTH EVERY DAY 90 tablet 1   cholecalciferol (VITAMIN D3) 25 MCG (1000 UNIT) tablet Take 1,000 Units by mouth daily.     HYDROcodone -acetaminophen  (NORCO) 10-325 MG tablet Take 1 tablet by mouth 2 (two) times daily. 60 tablet 0   lisinopril -hydrochlorothiazide  (ZESTORETIC ) 20-12.5 MG tablet TAKE 2 TABLETS BY MOUTH EVERY DAY 180 tablet 1   No current facility-administered medications for this visit.    Allergies  Allergen Reactions   Sulfa Antibiotics Hives and Swelling   Ibuprofen     GI IRRITATION    Family History  Problem  Relation Age of Onset   Diabetes Mother    Arthritis Mother    Arthritis Father    Hyperlipidemia Father    Stroke Father    Hypertension Father    Diabetes Father    Arthritis Maternal Grandmother    Arthritis Maternal Grandfather    Arthritis Paternal Grandmother    Heart disease Paternal Grandmother    Stroke Paternal Grandmother    Hypertension Paternal Grandmother    Diabetes Paternal Grandmother    Arthritis Paternal Grandfather    Hypertension Paternal Grandfather    Diabetes Paternal Grandfather     Social History   Socioeconomic History   Marital status: Married    Spouse name: Onie Kasparek   Number of children: 3   Years of education: Not on file   Highest education level: Not on file  Occupational History   Occupation: Disable  Tobacco Use   Smoking status: Never   Smokeless tobacco: Never  Vaping Use   Vaping status: Some Days   Substances: Mixture of cannabinoids  Substance and Sexual Activity   Alcohol use: Yes    Comment: rare   Drug use: Yes    Types: Marijuana    Comment: Cannabis oil   Sexual activity: Yes  Other Topics Concern   Not on file  Social History Narrative   Not on file   Social Drivers of Health   Financial Resource Strain: Low Risk  (  01/24/2022)   Overall Financial Resource Strain (CARDIA)    Difficulty of Paying Living Expenses: Not hard at all  Food Insecurity: Patient Declined (05/27/2023)   Hunger Vital Sign    Worried About Running Out of Food in the Last Year: Patient declined    Ran Out of Food in the Last Year: Patient declined  Transportation Needs: Patient Declined (05/27/2023)   PRAPARE - Administrator, Civil Service (Medical): Patient declined    Lack of Transportation (Non-Medical): Patient declined  Physical Activity: Patient Declined (05/27/2023)   Exercise Vital Sign    Days of Exercise per Week: Patient declined    Minutes of Exercise per Session: Patient declined  Stress: Patient Declined  (05/27/2023)   [employer] of Occupational Health - Occupational Stress Questionnaire    Feeling of Stress : Patient declined  Social Connections: Patient Declined (05/27/2023)   Social Connection and Isolation Panel [NHANES]    Frequency of Communication with Friends and Family: Patient declined    Frequency of Social Gatherings with Friends and Family: Patient declined    Attends Religious Services: Patient declined    Database Administrator or Organizations: Patient declined    Attends Banker Meetings: Patient declined    Marital Status: Patient declined  Intimate Partner Violence: Patient Declined (05/27/2023)   Humiliation, Afraid, Rape, and Kick questionnaire    Fear of Current or Ex-Partner: Patient declined    Emotionally Abused: Patient declined    Physically Abused: Patient declined    Sexually Abused: Patient declined     Constitutional: Denies fever, malaise, fatigue, headache or abrupt weight changes.  HEENT: Denies eye pain, eye redness, ear pain, ringing in the ears, wax buildup, runny nose, nasal congestion, bloody nose, or sore throat. Respiratory: Denies difficulty breathing, shortness of breath, cough or sputum production.   Cardiovascular: Pt reports chest pain. Denies chest tightness, palpitations or swelling in the hands or feet.  Gastrointestinal: Patient reports nausea, alternating constipation and diarrhea.  Denies abdominal pain, bloating, or blood in the stool.  GU: Denies urgency, frequency, pain with urination, burning sensation, blood in urine, odor or discharge. Musculoskeletal: Patient reports left arm pain, chronic joint and muscle pain.  Denies decrease in range of motion, difficulty with gait, or joint swelling.  Skin: Denies redness, rashes, lesions or ulcercations.  Neurological: Patient reports insomnia, paresthesia of left upper extremity.  Denies dizziness, difficulty with memory, difficulty with speech or problems with balance  and coordination.  Psych: Patient has a history of anxiety and depression.  Denies SI/HI.  No other specific complaints in a complete review of systems (except as listed in HPI above).  Objective:   Physical Exam  BP 134/82 (BP Location: Left Arm, Patient Position: Sitting, Cuff Size: Large)   Ht 5' 7 (1.702 m)   Wt 207 lb (93.9 kg)   LMP  (LMP Unknown)   BMI 32.42 kg/m    Wt Readings from Last 3 Encounters:  06/24/23 207 lb (93.9 kg)  05/27/23 214 lb (97.1 kg)  04/21/23 212 lb (96.2 kg)    General: Appears her stated age, obese, in NAD. Skin: Warm, dry and intact.  Cardiovascular: Normal rate and rhythm. S1,S2 noted.  No murmur, rubs or gallops noted.  Pulmonary/Chest: Normal effort and positive vesicular breath sounds. No respiratory distress. No wheezes, rales or ronchi noted.  Musculoskeletal: She does tenderness with palpation of the intercostal spaces on both sides of the sternum.  Normal internal and external rotation of  the left shoulder.  Shoulder shrug equal.  Strength 5/5 BUE.  Handgrips equal.  No difficulty with gait.  Neurological: Alert and oriented.  Psychiatric: Anxious appearing.   BMET    Component Value Date/Time   NA 138 05/27/2023 0948   K 4.1 05/27/2023 0948   CL 104 05/27/2023 0948   CO2 24 05/27/2023 0948   GLUCOSE 105 (H) 05/27/2023 0948   BUN 16 05/27/2023 0948   CREATININE 0.67 05/27/2023 0948   CALCIUM 9.7 05/27/2023 0948   GFRNONAA 96 12/11/2020 0844   GFRAA 112 12/11/2020 0844    Lipid Panel     Component Value Date/Time   CHOL 189 05/27/2023 0948   TRIG 292 (H) 05/27/2023 0948   HDL 39 (L) 05/27/2023 0948   CHOLHDL 4.8 05/27/2023 0948   VLDL 63.0 (H) 12/13/2019 1254   LDLCALC 108 (H) 05/27/2023 0948    CBC    Component Value Date/Time   WBC 9.5 05/27/2023 0948   RBC 4.59 05/27/2023 0948   HGB 14.1 05/27/2023 0948   HCT 41.4 05/27/2023 0948   PLT 314 05/27/2023 0948   MCV 90.2 05/27/2023 0948   MCH 30.7 05/27/2023 0948    MCHC 34.1 05/27/2023 0948   RDW 12.6 05/27/2023 0948   LYMPHSABS 2.7 09/09/2011 2110   MONOABS 0.9 09/09/2011 2110   EOSABS 1.0 (H) 09/09/2011 2110   BASOSABS 0.0 09/09/2011 2110    Hgb A1C Lab Results  Component Value Date   HGBA1C 5.9 (H) 05/27/2023           Assessment & Plan:  Chest pain, nausea, anxiety, depression, fibromyalgia, costochondritis, ankylosing spondylitis:  Indication for ECG: Chest pain Interpretation of ECG: Normal rate and rhythm, no signs of ACS Comparison of ECG: 2018, unchanged Advised her that her chest pain could be accommodation of her costochondritis versus extreme stress Encourage stress reduction techniques May want to take naproxen  2 tabs every 12 hours for the next 5 days with food if symptoms persist Consider referral to cardiology for stress testing if she continues to be experience symptoms  RTC in 5 months, follow-up chronic conditions [name] Laura, NP

## 2023-06-24 NOTE — Patient Instructions (Signed)
Managing Stress, Adult Feeling a certain amount of stress is normal. Stress helps our body and mind get ready to deal with the demands of life. Stress hormones can motivate you to do well at work and meet your responsibilities. But severe or long-term (chronic) stress can affect your mental and physical health. Chronic stress puts you at higher risk for: Anxiety and depression. Other health problems such as digestive problems, muscle aches, heart disease, high blood pressure, and stroke. What are the causes? Common causes of stress include: Demands from work, such as deadlines, feeling overworked, or having long hours. Pressures at home, such as money issues, disagreements with a spouse, or parenting issues. Pressures from major life changes, such as divorce, moving, loss of a loved one, or chronic illness. You may be at higher risk for stress-related problems if you: Do not get enough sleep. Are in poor health. Do not have emotional support. Have a mental health disorder such as anxiety or depression. How to recognize stress Stress can make you: Have trouble sleeping. Feel sad, anxious, irritable, or overwhelmed. Lose your appetite. Overeat or want to eat unhealthy foods. Want to use drugs or alcohol. Stress can also cause physical symptoms, such as: Sore, tense muscles, especially in the shoulders and neck. Headaches. Trouble breathing. A faster heart rate. Stomach pain, nausea, or vomiting. Diarrhea or constipation. Trouble concentrating. Follow these instructions at home: Eating and drinking Eat a healthy diet. This includes: Eating foods that are high in fiber, such as beans, whole grains, and fresh fruits and vegetables. Limiting foods that are high in fat and processed sugars, such as fried or sweet foods. Do not skip meals or overeat. Drink enough fluid to keep your urine pale yellow. Alcohol use Do not drink alcohol if: Your health care provider tells you not to  drink. You are pregnant, may be pregnant, or are planning to become pregnant. Drinking alcohol is a way some people try to ease their stress. This can be dangerous, so if you drink alcohol: Limit how much you have to: 0-1 drink a day for women. 0-2 drinks a day for men. Know how much alcohol is in your drink. In the [place] one drink equals one 12 oz bottle of beer (355 mL), one 5 oz glass of wine (148 mL), or one 1 oz glass of hard liquor (44 mL). Activity  Include 30 minutes of exercise in your daily schedule. Exercise is a good stress reducer. Include time in your day for an activity that you find relaxing. Try taking a walk, going on a bike ride, reading a book, or listening to music. Schedule your time in a way that lowers stress, and keep a regular schedule. Focus on doing what is most important to get done. Lifestyle Identify the source of your stress and your reaction to it. See a therapist who can help you change unhelpful reactions. When there are stressful events: Talk about them with family, friends, or coworkers. Try to think realistically about stressful events and not ignore them or overreact. Try to find the positives in a stressful situation and not focus on the negatives. Cut back on responsibilities at work and home, if possible. Ask for help from friends or family members if you need it. Find ways to manage stress, such as: Mindfulness, meditation, or deep breathing. Yoga or tai chi. Progressive muscle relaxation. Spending time in nature. Doing art, playing music, or reading. Making time for fun activities. Spending time with family and friends. Get support   from family, friends, or spiritual resources. General instructions Get enough sleep. Try to go to sleep and get up at about the same time every day. Take over-the-counter and prescription medicines only as told by your health care provider. Do not use any products that contain nicotine or tobacco. These products  include cigarettes, chewing tobacco, and vaping devices, such as e-cigarettes. If you need help quitting, ask your health care provider. Do not use drugs or smoke to deal with stress. Keep all follow-up visits. This is important. Where to find support Talk with your health care provider about stress management or finding a support group. Find a therapist to work with you on your stress management techniques. Where to find more information National Alliance on Mental Illness: www.nami.org American Psychological Association: www.apa.org Contact a health care provider if: Your stress symptoms get worse. You are unable to manage your stress at home. You are struggling to stop using drugs or alcohol. Get help right away if: You may be a danger to yourself or others. You have any thoughts of death or suicide. Get help right awayif you feel like you may hurt yourself or others, or have thoughts about taking your own life. Go to your nearest emergency room or: Call 911. Call the National Suicide Prevention Lifeline at 1-800-273-8255 or 988 in the [place] This is open 24 hours a day. Text the Crisis Text Line at 741741. Summary Feeling a certain amount of stress is normal, but severe or long-term (chronic) stress can affect your mental and physical health. Chronic stress can put you at higher risk for anxiety, depression, and other health problems such as digestive problems, muscle aches, heart disease, high blood pressure, and stroke. You may be at higher risk for stress-related problems if you do not get enough sleep, are in poor health, lack emotional support, or have a mental health disorder such as anxiety or depression. Identify the source of your stress and your reaction to it. Try talking about stressful events with family, friends, or coworkers, finding a coping method, or getting support from spiritual resources. If you need more help, talk with your health care provider about finding a  support group or a mental health therapist. This information is not intended to replace advice given to you by your health care provider. Make sure you discuss any questions you have with your health care provider. Document Revised: 12/28/2020 Document Reviewed: 12/26/2020 Elsevier Patient Education  2024 Elsevier Inc.  

## 2023-06-27 ENCOUNTER — Other Ambulatory Visit: Payer: Self-pay | Admitting: Internal Medicine

## 2023-06-27 NOTE — Telephone Encounter (Signed)
 Medication Refill -  Most Recent Primary Care Visit:  Provider: [name]  Department: [facility]  Visit Type: OFFICE VISIT  Date: 06/24/2023  Medication: Hydrocodone  10-325  Has the patient contacted their pharmacy? No (Agent: If no, request that the patient contact the pharmacy for the refill. If patient does not wish to contact the pharmacy document the reason why and proceed with request.) (Agent: If yes, when and what did the pharmacy advise?)  Is this the correct pharmacy for this prescription? Yes If no, delete pharmacy and type the correct one.  This is the patient's preferred pharmacy:   [facility]   Has the prescription been filled recently? Yes  Is the patient out of the medication? No  Has the patient been seen for an appointment in the last year OR does the patient have an upcoming appointment? Yes  Can we respond through MyChart? No  Agent: Please be advised that Rx refills may take up to 3 business days. We ask that you follow-up with your pharmacy.

## 2023-06-30 ENCOUNTER — Other Ambulatory Visit: Payer: Self-pay | Admitting: Internal Medicine

## 2023-06-30 MED ORDER — HYDROCODONE-ACETAMINOPHEN 10-325 MG PO TABS: 1.0000 | ORAL_TABLET | Freq: Two times a day (BID) | ORAL | 0 refills | Status: DC

## 2023-06-30 NOTE — Telephone Encounter (Signed)
 Medication Refill -  Most Recent Primary Care Visit:  Provider: [name]  Department: [facility]  Visit Type: OFFICE VISIT  Date: 06/24/2023  Medication: HYDROcodone -acetaminophen  (NORCO) 10-325 MG tablet   Has the patient contacted their pharmacy? No  Is this the correct pharmacy for this prescription? Yes If no, delete pharmacy and type the correct one.  This is the patient's preferred pharmacy: [facility] - [place], [place] - [address] [place] [address] Phone: [phone] Fax: [phone]  Has the prescription been filled recently? Yes  Is the patient out of the medication? Yes  Has the patient been seen for an appointment in the last year OR does the patient have an upcoming appointment? Yes  Can we respond through MyChart? No  Agent: Please be advised that Rx refills may take up to 3 business days. We ask that you follow-up with your pharmacy.

## 2023-06-30 NOTE — Telephone Encounter (Signed)
 Requested medications are due for refill today.  yes  Requested medications are on the active medications list.  yes  Last refill. 05/29/2023 #60 0 rf  Future visit scheduled.   yes  Notes to clinic.  Refill not delegated.    Requested Prescriptions  Pending Prescriptions Disp Refills   HYDROcodone -acetaminophen  (NORCO) 10-325 MG tablet 60 tablet 0    Sig: Take 1 tablet by mouth 2 (two) times daily.     Not Delegated - Analgesics:  Opioid Agonist Combinations Failed - 06/30/2023  2:04 PM      Failed - This refill cannot be delegated      Failed - Urine Drug Screen completed in last 360 days      Passed - Valid encounter within last 3 months    Recent Outpatient Visits           6 days ago Other chest pain   [place] [facility] [place], [name] ORN, NP   1 month ago Encounter for general adult medical examination with abnormal findings   [place] [facility] [place], [name] ORN, NP   2 months ago Epigastric abdominal pain   [place] [facility] [place], [name] ORN, NP   4 months ago Chronic pain of right knee   [place] [place], [name] ORN, NP   6 months ago Primary hypertension   [place] [facility] [place], [name] ORN, NP       Future Appointments             In 4 months Baity, [name] ORN, NP  [facility], [facility]

## 2023-07-01 MED ORDER — HYDROCODONE-ACETAMINOPHEN 10-325 MG PO TABS: 1.0000 | ORAL_TABLET | Freq: Two times a day (BID) | ORAL | 0 refills | Status: DC

## 2023-07-01 NOTE — Telephone Encounter (Signed)
 Requested medications are due for refill today.  no  Requested medications are on the active medications list.  yes  Last refill. 06/29/2022 #60 0 rf  Future visit scheduled.   yes  Notes to clinic.  Refill/refusal not delegated.    Requested Prescriptions  Pending Prescriptions Disp Refills   HYDROcodone -acetaminophen  (NORCO) 10-325 MG tablet 60 tablet 0    Sig: Take 1 tablet by mouth 2 (two) times daily.     Not Delegated - Analgesics:  Opioid Agonist Combinations Failed - 07/01/2023  2:17 PM      Failed - This refill cannot be delegated      Failed - Urine Drug Screen completed in last 360 days      Passed - Valid encounter within last 3 months    Recent Outpatient Visits           1 week ago Other chest pain   [place] [facility] [place], [name] ORN, NP   1 month ago Encounter for general adult medical examination with abnormal findings   [place] [facility] [place], [name] ORN, NP   2 months ago Epigastric abdominal pain   [place] [facility] [place], [name] ORN, NP   4 months ago Chronic pain of right knee   [facility] [place], [name] ORN, NP   7 months ago Primary hypertension   [place] [facility] [place], [name] ORN, NP       Future Appointments             In 4 months Baity, [name] ORN, NP [facility], [facility]

## 2023-07-29 ENCOUNTER — Telehealth: Payer: Self-pay

## 2023-07-29 NOTE — Telephone Encounter (Signed)
Copied from CRM [id number]. Topic: General - Other >> Jul 29, 2023 11:23 AM Macon Large wrote: Reason for CRM: Pt requests call back to discuss getting guardianship over her parents. Cb# [phone]

## 2023-08-01 ENCOUNTER — Other Ambulatory Visit: Payer: Self-pay | Admitting: Internal Medicine

## 2023-08-01 MED ORDER — HYDROCODONE-ACETAMINOPHEN 10-325 MG PO TABS: 1.0000 | ORAL_TABLET | Freq: Two times a day (BID) | ORAL | 0 refills | Status: DC

## 2023-08-01 NOTE — Telephone Encounter (Signed)
Copied from CRM [id number]. Topic: General - Other >> Aug 01, 2023  8:41 AM Everette C wrote: Reason for CRM: Medication Refill - Most Recent Primary Care Visit:  Provider: Lorre Munroe Department: [facility] Visit Type: OFFICE VISIT Date: 06/24/2023  Medication: HYDROcodone-acetaminophen (NORCO) 10-325 MG tablet [[id number]]  Has the patient contacted their pharmacy? Yes (Agent: If no, request that the patient contact the pharmacy for the refill. If patient does not wish to contact the pharmacy document the reason why and proceed with request.) (Agent: If yes, when and what did the pharmacy advise?)  Is this the correct pharmacy for this prescription? Yes If no, delete pharmacy and type the correct one.  This is the patient's preferred pharmacy:  [facility] - [place], [place] - [address] [place] [address] Phone: [phone] Fax: [phone]   Has the prescription been filled recently? Yes  Is the patient out of the medication? Yes  Has the patient been seen for an appointment in the last year OR does the patient have an upcoming appointment? Yes  Can we respond through MyChart? No  Agent: Please be advised that Rx refills may take up to 3 business days. We ask that you follow-up with your pharmacy.

## 2023-08-01 NOTE — Telephone Encounter (Signed)
Requested medication (s) are due for refill today: Yes  Requested medication (s) are on the active medication list: Yes  Last refill:  07/01/23  Future visit scheduled: Yes  Notes to clinic:  Unable to refill per protocol, cannot delegate.      Requested Prescriptions  Pending Prescriptions Disp Refills   HYDROcodone-acetaminophen (NORCO) 10-325 MG tablet 60 tablet 0    Sig: Take 1 tablet by mouth 2 (two) times daily.     Not Delegated - Analgesics:  Opioid Agonist Combinations Failed - 08/01/2023  2:00 PM      Failed - This refill cannot be delegated      Failed - Urine Drug Screen completed in last 360 days      Passed - Valid encounter within last 3 months    Recent Outpatient Visits           1 month ago Other chest pain   [place] [facility] [place], Salvadore Oxford, NP   2 months ago Encounter for general adult medical examination with abnormal findings   [place] [facility] [place], Salvadore Oxford, NP   3 months ago Epigastric abdominal pain   [place] [facility] [place], Salvadore Oxford, NP   5 months ago Chronic pain of right knee   [facility] [place], Salvadore Oxford, NP   8 months ago Primary hypertension   [place] [facility] [place], Salvadore Oxford, NP       Future Appointments             In 3 months Baity, Salvadore Oxford, NP  [facility], [facility]

## 2023-08-28 ENCOUNTER — Other Ambulatory Visit: Payer: Self-pay | Admitting: Internal Medicine

## 2023-08-28 NOTE — Telephone Encounter (Signed)
 Copied from CRM [id number]. Topic: Clinical - Medication Refill >> Aug 28, 2023  8:56 AM Franchot Heidelberg wrote: Most Recent Primary Care Visit:  Provider: Lorre Munroe  Department: [facility]  Visit Type: OFFICE VISIT  Date: 06/24/2023  Medication: HYDROcodone-acetaminophen (NORCO) 10-325 MG tablet  Has the patient contacted their pharmacy? Yes (Agent: If no, request that the patient contact the pharmacy for the refill. If patient does not wish to contact the pharmacy document the reason why and proceed with request.) (Agent: If yes, when and what did the pharmacy advise?)  Is this the correct pharmacy for this prescription? Yes If no, delete pharmacy and type the correct one.  This is the patient's preferred pharmacy:   [facility] - [place], [place] - [address] [place] [address] Phone: [phone] Fax: [phone]   Has the prescription been filled recently? Yes  Is the patient out of the medication? No  Has the patient been seen for an appointment in the last year OR does the patient have an upcoming appointment? Yes  Can we respond through MyChart? Yes  Agent: Please be advised that Rx refills may take up to 3 business days. We ask that you follow-up with your pharmacy.

## 2023-08-28 NOTE — Telephone Encounter (Signed)
 Requested medication (s) are due for refill today: yes  Requested medication (s) are on the active medication list: yes  Last refill:  08/02/23  Future visit scheduled: yes  Notes to clinic:  Unable to refill per protocol, cannot delegate.      Requested Prescriptions  Pending Prescriptions Disp Refills   HYDROcodone-acetaminophen (NORCO) 10-325 MG tablet 60 tablet 0    Sig: Take 1 tablet by mouth 2 (two) times daily.     Not Delegated - Analgesics:  Opioid Agonist Combinations Failed - 08/28/2023  2:59 PM      Failed - This refill cannot be delegated      Failed - Urine Drug Screen completed in last 360 days      Passed - Valid encounter within last 3 months    Recent Outpatient Visits           2 months ago Other chest pain   [facility] [place], Salvadore Oxford, NP   3 months ago Encounter for general adult medical examination with abnormal findings   [place] [facility] [place], Salvadore Oxford, NP   4 months ago Epigastric abdominal pain   [place] [facility] [place], Salvadore Oxford, NP   6 months ago Chronic pain of right knee   [facility] [place], Salvadore Oxford, NP   8 months ago Primary hypertension   [place] [facility] [place], Salvadore Oxford, NP       Future Appointments             In 2 months Baity, Salvadore Oxford, NP [facility], [address] [facility]

## 2023-08-29 MED ORDER — HYDROCODONE-ACETAMINOPHEN 10-325 MG PO TABS: 1.0000 | ORAL_TABLET | Freq: Two times a day (BID) | ORAL | 0 refills | Status: DC

## 2023-09-24 DIAGNOSIS — M19011 Primary osteoarthritis, right shoulder: Secondary | ICD-10-CM | POA: Diagnosis not present

## 2023-09-24 DIAGNOSIS — M19012 Primary osteoarthritis, left shoulder: Secondary | ICD-10-CM | POA: Diagnosis not present

## 2023-09-25 ENCOUNTER — Other Ambulatory Visit: Payer: Self-pay | Admitting: Internal Medicine

## 2023-09-25 NOTE — Telephone Encounter (Signed)
 Copied from CRM [id number]. Topic: Clinical - Medication Refill >> Sep 25, 2023  8:41 AM Franchot Heidelberg wrote: Most Recent Primary Care Visit:  Provider: Lorre Munroe  Department: [facility]  Visit Type: OFFICE VISIT  Date: 06/24/2023  Medication: HYDROcodone-acetaminophen (NORCO) 10-325 MG tablet  Has the patient contacted their pharmacy? Yes (Agent: If no, request that the patient contact the pharmacy for the refill. If patient does not wish to contact the pharmacy document the reason why and proceed with request.) (Agent: If yes, when and what did the pharmacy advise?)  Is this the correct pharmacy for this prescription? Yes If no, delete pharmacy and type the correct one.  This is the patient's preferred pharmacy:   [facility] - [place], [place] - [address] [place] [address] Phone: [phone] Fax: [phone]   Has the prescription been filled recently? Yes  Is the patient out of the medication? No. But will be out Sunday   Has the patient been seen for an appointment in the last year OR does the patient have an upcoming appointment? Yes  Can we respond through MyChart? Yes  Agent: Please be advised that Rx refills may take up to 3 business days. We ask that you follow-up with your pharmacy.

## 2023-09-25 NOTE — Telephone Encounter (Signed)
 Requested medications are due for refill today.  yes  Requested medications are on the active medications list.  yes  Last refill. 08/29/2023 #60 0 rf  Future visit scheduled.   yes  Notes to clinic.  Refill not delegated.    Requested Prescriptions  Pending Prescriptions Disp Refills   HYDROcodone-acetaminophen (NORCO) 10-325 MG tablet 60 tablet 0    Sig: Take 1 tablet by mouth 2 (two) times daily.     Not Delegated - Analgesics:  Opioid Agonist Combinations Failed - 09/25/2023  4:13 PM      Failed - This refill cannot be delegated      Failed - Urine Drug Screen completed in last 360 days      Failed - Valid encounter within last 3 months    Recent Outpatient Visits   None     Future Appointments             In 2 months Baity, Salvadore Oxford, NP [facility], [facility]

## 2023-09-26 MED ORDER — HYDROCODONE-ACETAMINOPHEN 10-325 MG PO TABS: 1.0000 | ORAL_TABLET | Freq: Two times a day (BID) | ORAL | 0 refills | Status: DC

## 2023-09-29 ENCOUNTER — Other Ambulatory Visit: Payer: Self-pay | Admitting: Internal Medicine

## 2023-09-30 NOTE — Telephone Encounter (Signed)
 Requested medications are due for refill today.  no  Requested medications are on the active medications list.  yes  Last refill. 09/26/2023 #60 0 rf  Future visit scheduled.   yes  Notes to clinic.  Refill/refusal not delegated.    Requested Prescriptions  Pending Prescriptions Disp Refills   HYDROcodone-acetaminophen (NORCO) 10-325 MG tablet [Pharmacy Med Name: HYDROCODONE BITARTRATE/ACETAMINOPHEN 10-325MG  TABLET] 60 tablet 0    Sig: TAKE ONE TABLET BY MOUTH TWO TIMES DAILY.     Not Delegated - Analgesics:  Opioid Agonist Combinations Failed - 09/30/2023 12:45 PM      Failed - This refill cannot be delegated      Failed - Urine Drug Screen completed in last 360 days      Failed - Valid encounter within last 3 months    Recent Outpatient Visits   None     Future Appointments             In 1 month Baity, Rankin Buzzard, NP [facility], [facility]

## 2023-10-21 ENCOUNTER — Other Ambulatory Visit: Payer: Self-pay | Admitting: Internal Medicine

## 2023-10-21 NOTE — Telephone Encounter (Signed)
 Copied from CRM [id number]. Topic: Clinical - Medication Refill >> Oct 21, 2023  9:13 AM Juluis Ok wrote: Most Recent Primary Care Visit:  Provider: Carollynn Cirri  Department: [facility]  Visit Type: OFFICE VISIT  Date: 06/24/2023  Medication: HYDROcodone -acetaminophen  (NORCO) 10-325 MG tablet  Has the patient contacted their pharmacy? No (Agent: If no, request that the patient contact the pharmacy for the refill. If patient does not wish to contact the pharmacy document the reason why and proceed with request.) (Agent: If yes, when and what did the pharmacy advise?)  Is this the correct pharmacy for this prescription? Yes If no, delete pharmacy and type the correct one.  This is the patient's preferred pharmacy:   [facility] - [place], [place] - [address] [place] [address] Phone: [phone] Fax: [phone]   Has the prescription been filled recently? No  Is the patient out of the medication? Yes  Has the patient been seen for an appointment in the last year OR does the patient have an upcoming appointment? Yes  Can we respond through MyChart? No  Agent: Please be advised that Rx refills may take up to 3 business days. We ask that you follow-up with your pharmacy.

## 2023-10-22 NOTE — Telephone Encounter (Signed)
 Requested medication (s) are due for refill today: yes  Requested medication (s) are on the active medication list: yes  Last refill:  06/30/23 #60  Future visit scheduled: yes  Notes to clinic:  med not delegated to NT to reorder   Requested Prescriptions  Pending Prescriptions Disp Refills   HYDROcodone -acetaminophen  (NORCO) 10-325 MG tablet 60 tablet 0    Sig: Take 1 tablet by mouth 2 (two) times daily.     Not Delegated - Analgesics:  Opioid Agonist Combinations Failed - 10/22/2023  4:01 PM      Failed - This refill cannot be delegated      Failed - Urine Drug Screen completed in last 360 days      Failed - Valid encounter within last 3 months    Recent Outpatient Visits   None     Future Appointments             In 1 month Baity, Rankin Buzzard, NP [facility], [facility]

## 2023-10-23 MED ORDER — HYDROCODONE-ACETAMINOPHEN 10-325 MG PO TABS: 1.0000 | ORAL_TABLET | Freq: Two times a day (BID) | ORAL | 0 refills | Status: DC

## 2023-11-25 ENCOUNTER — Ambulatory Visit: Payer: Self-pay | Admitting: Internal Medicine

## 2023-11-25 NOTE — Progress Notes (Deleted)
 Subjective:    Patient ID: Belinda Calderon, female    DOB: 01/03/1961, 63 y.o.   MRN: [id number]  HPI  Patient presents to clinic today for follow-up of chronic conditions.  HTN: Her BP today is 126/82.  She is taking lisinopril  HCT and amlodipine  as prescribed.  ECG from 06/2023 reviewed.  OA/fibromyalgia/ankylosing spondylitis: This is currently managed on hydrocodone .  She follows with rheumatology.  Anxiety/panic attacks and depression: Chronic.  She is not currently taking any medication for this but has been on clonazepam  in the past.  She does smoke THC to help her cope.  She is not currently seeing a therapist.  She denies SI/HI.  IBS: She reports alternating constipation and diarrhea.  She tries to control this with diet.  There is no colonoscopy on file.  HLD: Her last LDL was 108, triglycerides 292, 05/2023.  She is not taking any cholesterol-lowering medication at this time.  She tries to consume low-fat diet.  Prediabetes: Her last A1c was 5.9%, 05/2023.  She is not taking any oral diabetic medication at this time.  She does not check her sugars.  Review of Systems     Past Medical History:  Diagnosis Date   Ankylosing spondylitis (HCC)    Degenerative disc disease    Fibromyalgia    Hypertension    IBS (irritable bowel syndrome)     Current Outpatient Medications  Medication Sig Dispense Refill   amLODipine  (NORVASC ) 5 MG tablet TAKE 1 TABLET BY MOUTH EVERY DAY 90 tablet 1   cholecalciferol (VITAMIN D3) 25 MCG (1000 UNIT) tablet Take 1,000 Units by mouth daily.     HYDROcodone -acetaminophen  (NORCO) 10-325 MG tablet Take 1 tablet by mouth 2 (two) times daily. 60 tablet 0   HYDROcodone -acetaminophen  (NORCO) 10-325 MG tablet Take 1 tablet by mouth 2 (two) times daily. 60 tablet 0   lisinopril -hydrochlorothiazide  (ZESTORETIC ) 20-12.5 MG tablet TAKE 2 TABLETS BY MOUTH EVERY DAY 180 tablet 1   No current facility-administered medications for this visit.     Allergies  Allergen Reactions   Sulfa Antibiotics Hives and Swelling   Ibuprofen     GI IRRITATION    Family History  Problem Relation Age of Onset   Diabetes Mother    Arthritis Mother    Arthritis Father    Hyperlipidemia Father    Stroke Father    Hypertension Father    Diabetes Father    Arthritis Maternal Grandmother    Arthritis Maternal Grandfather    Arthritis Paternal Grandmother    Heart disease Paternal Grandmother    Stroke Paternal Grandmother    Hypertension Paternal Grandmother    Diabetes Paternal Grandmother    Arthritis Paternal Grandfather    Hypertension Paternal Grandfather    Diabetes Paternal Grandfather     Social History   Socioeconomic History   Marital status: Married    Spouse name: Tenzin Pavon   Number of children: 3   Years of education: Not on file   Highest education level: Not on file  Occupational History   Occupation: Disable  Tobacco Use   Smoking status: Never   Smokeless tobacco: Never  Vaping Use   Vaping status: Some Days   Substances: Mixture of cannabinoids  Substance and Sexual Activity   Alcohol use: Yes    Comment: rare   Drug use: Yes    Types: Marijuana    Comment: Cannabis oil   Sexual activity: Yes  Other Topics Concern   Not on file  Social History Narrative   Not on file   Social Drivers of Health   Financial Resource Strain: Low Risk  (01/24/2022)   Overall Financial Resource Strain (CARDIA)    Difficulty of Paying Living Expenses: Not hard at all  Food Insecurity: Patient Declined (05/27/2023)   Hunger Vital Sign    Worried About Running Out of Food in the Last Year: Patient declined    Ran Out of Food in the Last Year: Patient declined  Transportation Needs: Patient Declined (05/27/2023)   PRAPARE - Administrator, Civil Service (Medical): Patient declined    Lack of Transportation (Non-Medical): Patient declined  Physical Activity: Patient Declined (05/27/2023)   Exercise  Vital Sign    Days of Exercise per Week: Patient declined    Minutes of Exercise per Session: Patient declined  Stress: Patient Declined (05/27/2023)   [employer] of Occupational Health - Occupational Stress Questionnaire    Feeling of Stress : Patient declined  Social Connections: Patient Declined (05/27/2023)   Social Connection and Isolation Panel [NHANES]    Frequency of Communication with Friends and Family: Patient declined    Frequency of Social Gatherings with Friends and Family: Patient declined    Attends Religious Services: Patient declined    Database administrator or Organizations: Patient declined    Attends Banker Meetings: Patient declined    Marital Status: Patient declined  Intimate Partner Violence: Patient Declined (05/27/2023)   Humiliation, Afraid, Rape, and Kick questionnaire    Fear of Current or Ex-Partner: Patient declined    Emotionally Abused: Patient declined    Physically Abused: Patient declined    Sexually Abused: Patient declined     Constitutional: Denies fever, malaise, fatigue, headache or abrupt weight changes.  HEENT: Denies eye pain, eye redness, ear pain, ringing in the ears, wax buildup, runny nose, nasal congestion, bloody nose, or sore throat. Respiratory: Denies difficulty breathing, shortness of breath, cough or sputum production.   Cardiovascular: Denies chest pain, chest tightness, palpitations or swelling in the hands or feet.  Gastrointestinal: Patient reports alternating constipation and diarrhea.  Denies abdominal pain, bloating, or blood in the stool.  GU: Denies urgency, frequency, pain with urination, burning sensation, blood in urine, odor or discharge. Musculoskeletal: Patient reports chronic joint and muscle pain.  Denies decrease in range of motion, difficulty with gait, or joint swelling.  Skin: Denies redness, rashes, lesions or ulcercations.  Neurological: Denies dizziness, difficulty with memory,  difficulty with speech or problems with balance and coordination.  Psych: Patient has a history of anxiety and depression.  Denies SI/HI.  No other specific complaints in a complete review of systems (except as listed in HPI above).  Objective:   Physical Exam  LMP  (LMP Unknown)   Wt Readings from Last 3 Encounters:  06/24/23 207 lb (93.9 kg)  05/27/23 214 lb (97.1 kg)  04/21/23 212 lb (96.2 kg)    General: Appears her stated age, obese, in NAD. Skin: Warm, dry and intact.  Cardiovascular: Normal rate and rhythm. S1,S2 noted.  No murmur, rubs or gallops noted. No JVD or BLE edema.  Pulmonary/Chest: Normal effort and positive vesicular breath sounds. No respiratory distress. No wheezes, rales or ronchi noted.  Musculoskeletal: bilateral hands in braces. Brace to left lower extremity. Gait slow and steady without device.  Neurological: Alert and oriented. Coordination normal.  Psychiatric: Mood and affect normal. Behavior is normal. Judgment and thought content normal.     BMET  Component Value Date/Time   NA 138 05/27/2023 0948   K 4.1 05/27/2023 0948   CL 104 05/27/2023 0948   CO2 24 05/27/2023 0948   GLUCOSE 105 (H) 05/27/2023 0948   BUN 16 05/27/2023 0948   CREATININE 0.67 05/27/2023 0948   CALCIUM 9.7 05/27/2023 0948   GFRNONAA 96 12/11/2020 0844   GFRAA 112 12/11/2020 0844    Lipid Panel     Component Value Date/Time   CHOL 189 05/27/2023 0948   TRIG 292 (H) 05/27/2023 0948   HDL 39 (L) 05/27/2023 0948   CHOLHDL 4.8 05/27/2023 0948   VLDL 63.0 (H) 12/13/2019 1254   LDLCALC 108 (H) 05/27/2023 0948    CBC    Component Value Date/Time   WBC 9.5 05/27/2023 0948   RBC 4.59 05/27/2023 0948   HGB 14.1 05/27/2023 0948   HCT 41.4 05/27/2023 0948   PLT 314 05/27/2023 0948   MCV 90.2 05/27/2023 0948   MCH 30.7 05/27/2023 0948   MCHC 34.1 05/27/2023 0948   RDW 12.6 05/27/2023 0948   LYMPHSABS 2.7 09/09/2011 2110   MONOABS 0.9 09/09/2011 2110   EOSABS 1.0  (H) 09/09/2011 2110   BASOSABS 0.0 09/09/2011 2110    Hgb A1C Lab Results  Component Value Date   HGBA1C 5.9 (H) 05/27/2023            Assessment & Plan:      RTC in 6 months for your annual exam Helayne Lo, NP

## 2023-11-26 ENCOUNTER — Other Ambulatory Visit: Payer: Self-pay | Admitting: Internal Medicine

## 2023-11-26 MED ORDER — HYDROCODONE-ACETAMINOPHEN 10-325 MG PO TABS: 1.0000 | ORAL_TABLET | Freq: Two times a day (BID) | ORAL | 0 refills | Status: DC

## 2023-11-26 NOTE — Telephone Encounter (Signed)
 Copied from CRM [id number]. Topic: Clinical - Medication Refill >> Nov 26, 2023  9:52 AM Adaline Holly wrote: Medication: HYDROcodone -acetaminophen  (NORCO) 10-325 MG tablet  Has the patient contacted their pharmacy? Yes (Agent: If no, request that the patient contact the pharmacy for the refill. If patient does not wish to contact the pharmacy document the reason why and proceed with request.) (Agent: If yes, when and what did the pharmacy advise?)  This is the patient's preferred pharmacy:  [facility] [phone] [facility], [place] - [address] [place] [place] [address] Phone: [phone] Fax: [phone]    Is this the correct pharmacy for this prescription? Yes If no, delete pharmacy and type the correct one.   Has the prescription been filled recently? Yes  Is the patient out of the medication? No  Has the patient been seen for an appointment in the last year OR does the patient have an upcoming appointment? Yes  Can we respond through MyChart? Yes  Agent: Please be advised that Rx refills may take up to 3 business days. We ask that you follow-up with your pharmacy.

## 2023-12-19 ENCOUNTER — Other Ambulatory Visit: Payer: Self-pay | Admitting: Internal Medicine

## 2023-12-23 NOTE — Telephone Encounter (Signed)
 Attempted to contact patient to schedule appointment- no answer- message to call office left. Courtesy 30 day Rx given Requested Prescriptions  Pending Prescriptions Disp Refills   lisinopril -hydrochlorothiazide  (ZESTORETIC ) 20-12.5 MG tablet [Pharmacy Med Name: LISINOPRIL -HCTZ 20-12.5 MG TAB] 180 tablet 1    Sig: TAKE 2 TABLETS BY MOUTH EVERY DAY     Cardiovascular:  ACEI + Diuretic Combos Failed - 12/23/2023 10:49 AM      Failed - Na in normal range and within 180 days    Sodium  Date Value Ref Range Status  05/27/2023 138 135 - 146 mmol/L Final         Failed - K in normal range and within 180 days    Potassium  Date Value Ref Range Status  05/27/2023 4.1 3.5 - 5.3 mmol/L Final         Failed - Cr in normal range and within 180 days    Creat  Date Value Ref Range Status  05/27/2023 0.67 0.50 - 1.05 mg/dL Final         Failed - eGFR is 30 or above and within 180 days    GFR, Est African American  Date Value Ref Range Status  12/11/2020 112 > OR = 60 mL/min/1.19m2 Final   GFR, Est Non African American  Date Value Ref Range Status  12/11/2020 96 > OR = 60 mL/min/1.32m2 Final   GFR  Date Value Ref Range Status  07/26/2020 93.23 >60.00 mL/min Final    Comment:    Calculated using the CKD-EPI Creatinine Equation (2021)   eGFR  Date Value Ref Range Status  05/27/2023 99 > OR = 60 mL/min/1.54m2 Final         Failed - Valid encounter within last 6 months    Recent Outpatient Visits   None            Passed - Patient is not pregnant      Passed - Last BP in normal range    BP Readings from Last 1 Encounters:  06/24/23 134/82          amLODipine  (NORVASC ) 5 MG tablet [Pharmacy Med Name: AMLODIPINE  BESYLATE 5 MG TAB] 90 tablet 1    Sig: TAKE 1 TABLET BY MOUTH EVERY DAY     Cardiovascular: Calcium Channel Blockers 2 Failed - 12/23/2023 10:49 AM      Failed - Valid encounter within last 6 months    Recent Outpatient Visits   None            Passed - Last BP  in normal range    BP Readings from Last 1 Encounters:  06/24/23 134/82         Passed - Last Heart Rate in normal range    Pulse Readings from Last 1 Encounters:  04/21/23 78

## 2023-12-26 DIAGNOSIS — H04123 Dry eye syndrome of bilateral lacrimal glands: Secondary | ICD-10-CM | POA: Diagnosis not present

## 2023-12-26 DIAGNOSIS — H40013 Open angle with borderline findings, low risk, bilateral: Secondary | ICD-10-CM | POA: Diagnosis not present

## 2023-12-26 DIAGNOSIS — H52213 Irregular astigmatism, bilateral: Secondary | ICD-10-CM | POA: Diagnosis not present

## 2023-12-26 DIAGNOSIS — H524 Presbyopia: Secondary | ICD-10-CM | POA: Diagnosis not present

## 2023-12-26 DIAGNOSIS — H25813 Combined forms of age-related cataract, bilateral: Secondary | ICD-10-CM | POA: Diagnosis not present

## 2023-12-26 DIAGNOSIS — H43811 Vitreous degeneration, right eye: Secondary | ICD-10-CM | POA: Diagnosis not present

## 2023-12-29 ENCOUNTER — Other Ambulatory Visit: Payer: Self-pay | Admitting: Internal Medicine

## 2023-12-29 NOTE — Telephone Encounter (Signed)
 11/26/23 60 tabs/0 RF

## 2023-12-29 NOTE — Telephone Encounter (Unsigned)
 Copied from CRM [id number]. Topic: Clinical - Medication Refill >> Dec 29, 2023  9:31 AM Elle L wrote: Medication: HYDROcodone -acetaminophen  (NORCO) 10-325 MG tablet  Has the patient contacted their pharmacy? Yes  This is the patient's preferred pharmacy:  [phone] [place] - [address] [place] [address] Phone: [phone] Fax: [phone]  Is this the correct pharmacy for this prescription? Yes  Has the prescription been filled recently? Yes  Is the patient out of the medication? No  Has the patient been seen for an appointment in the last year OR does the patient have an upcoming appointment? Yes  Can we respond through MyChart? No  Agent: Please be advised that Rx refills may take up to 3 business days. We ask that you follow-up with your pharmacy.

## 2023-12-30 MED ORDER — HYDROCODONE-ACETAMINOPHEN 10-325 MG PO TABS: 1.0000 | ORAL_TABLET | Freq: Two times a day (BID) | ORAL | 0 refills | Status: DC

## 2023-12-30 NOTE — Telephone Encounter (Signed)
 Requested medication (s) are due for refill today: yes  Requested medication (s) are on the active medication list: yes  Last refill:  09/26/23  Future visit scheduled: no  Notes to clinic:  Unable to refill per protocol, cannot delegate.      Requested Prescriptions  Pending Prescriptions Disp Refills   HYDROcodone -acetaminophen  (NORCO) 10-325 MG tablet 60 tablet 0    Sig: Take 1 tablet by mouth 2 (two) times daily.     Not Delegated - Analgesics:  Opioid Agonist Combinations Failed - 12/30/2023  3:56 PM      Failed - This refill cannot be delegated      Failed - Urine Drug Screen completed in last 360 days      Failed - Valid encounter within last 3 months    Recent Outpatient Visits   None

## 2023-12-31 ENCOUNTER — Ambulatory Visit: Payer: Self-pay

## 2023-12-31 NOTE — Telephone Encounter (Addendum)
 FYI Only or Action Required?: FYI only for provider.  Patient was last seen in primary care on 06/24/2023 by Belinda Calderon ORN, NP.  Called Nurse Triage reporting Neck Pain, Headache, Pain, positional dizziness, interrupted sleep, continued weakness in left arm, continued chest pain from AS coming through to breast bone, and pain meds not working.  Symptoms began acute on chronic, this level for the past week.  Interventions attempted: Prescription medications: hydrocodone  and Rest, hydration, or home remedies.  Symptoms are: rapidly worsening.  Triage Disposition: Go to ED Now (or PCP Triage)  Patient/caregiver understands and will follow disposition?: No, wishes to speak with PCP       Copied from CRM [id number]. Topic: Clinical - Red Word Triage >> Dec 31, 2023  8:24 AM Vena H wrote: Red Word that prompted transfer to Nurse Triage:  Pt called in stating she has Ankylosing spondylitis and has pain that is running from her neck up and causing headaches. She is wanting to be seen for xray, pt states her pain is always at 10 Reason for Disposition  Patient sounds very sick or weak to the triager  Answer Assessment - Initial Assessment Questions 1. ONSET: When did the pain begin?      At least 7 days at this level here 2. LOCATION: Where does it hurt?      Neck both sides, really bad on right side, starts in neck then headache Have had glasses script changed but not my script Done been told with spinal fusion, eventually going to have to run these bars into my neck, need someone to check it out and tell me what's going on Does not quit even with pain meds Spent past 2 weeks finding it, in the bones Prefer to start with Calderon, don't want bars in back no more Hoping for x-ray and what not to do Fusing together, don't want to turn my head and break my neck Don't come unless cannot fix it, ain't nothing she can do besides x-ray 3. PATTERN Does the pain come and go, or has it been  constant since it started?      constant 4. SEVERITY: How bad is the pain?  (Scale 0-10; or none or slight stiffness, mild, moderate, severe)     Don't do that pain chart, have chronic pain, past 10/10, walk around with chronic pain all the time, bad enough, been up since 3 am, issues sleeping, definitely something feels like maybe a knot on right side right at base of my neck, put head back get real bad dizzy even when laying in bed, like having labor pains in my neck, or turn head side to side, can't leave head turned for over few minutes or will have to physically take my hands and turn my head to put it back 5. RADIATION: Does the pain go anywhere else, shoot into your arms?     no 6. CORD SYMPTOMS: Any weakness or numbness of the arms or legs?     Weakness in left arm for quite the while, fibromyalgia Arthritis comes up through breast, makes me feel like having heart attack, been to ED many times every time AS coming through to breast bone 9. OTHER SYMPTOMS: Do you have any other symptoms? (e.g., headache, fever, chest pain, difficulty breathing, neck swelling)     No chest pain out of ordinary or SOB, can't tell you that neck hasn't swelled, but no fever Not an emergency just at point where ready to come see [facility]  Use marijuana first and foremost for pain, then hydrocodone  10-325, neither touching the pain   Pt requesting appt with PCP only and x-ray to determine severity of chronic condition, refusing ED unless my guts are hanging out, scheduled for soonest available tomorrow afternoon. Advised ED for any worsening.  Protocols used: Neck Pain or Stiffness-A-AH

## 2023-12-31 NOTE — Telephone Encounter (Signed)
 Agree with advice given.  Will discuss at upcoming appointment tomorrow.

## 2024-01-01 ENCOUNTER — Ambulatory Visit: Admitting: Internal Medicine

## 2024-01-01 ENCOUNTER — Encounter: Payer: Self-pay | Admitting: Internal Medicine

## 2024-01-08 ENCOUNTER — Ambulatory Visit: Admitting: Internal Medicine

## 2024-01-14 ENCOUNTER — Encounter: Payer: Self-pay | Admitting: Internal Medicine

## 2024-01-14 ENCOUNTER — Ambulatory Visit (INDEPENDENT_AMBULATORY_CARE_PROVIDER_SITE_OTHER): Admitting: Internal Medicine

## 2024-01-14 VITALS — BP 130/82 | Ht 67.0 in | Wt 196.2 lb

## 2024-01-14 DIAGNOSIS — M797 Fibromyalgia: Secondary | ICD-10-CM

## 2024-01-14 DIAGNOSIS — E66811 Obesity, class 1: Secondary | ICD-10-CM | POA: Diagnosis not present

## 2024-01-14 DIAGNOSIS — R7303 Prediabetes: Secondary | ICD-10-CM | POA: Diagnosis not present

## 2024-01-14 DIAGNOSIS — E6609 Other obesity due to excess calories: Secondary | ICD-10-CM

## 2024-01-14 DIAGNOSIS — K582 Mixed irritable bowel syndrome: Secondary | ICD-10-CM

## 2024-01-14 DIAGNOSIS — I10 Essential (primary) hypertension: Secondary | ICD-10-CM | POA: Diagnosis not present

## 2024-01-14 DIAGNOSIS — F419 Anxiety disorder, unspecified: Secondary | ICD-10-CM | POA: Diagnosis not present

## 2024-01-14 DIAGNOSIS — G894 Chronic pain syndrome: Secondary | ICD-10-CM

## 2024-01-14 DIAGNOSIS — E782 Mixed hyperlipidemia: Secondary | ICD-10-CM

## 2024-01-14 DIAGNOSIS — F32A Depression, unspecified: Secondary | ICD-10-CM | POA: Diagnosis not present

## 2024-01-14 DIAGNOSIS — M1711 Unilateral primary osteoarthritis, right knee: Secondary | ICD-10-CM | POA: Diagnosis not present

## 2024-01-14 DIAGNOSIS — M456 Ankylosing spondylitis lumbar region: Secondary | ICD-10-CM | POA: Diagnosis not present

## 2024-01-14 NOTE — Assessment & Plan Note (Signed)
 Continue hydrocodone  10-325 mg BID as needed Encouraged regular stretching and muscle toning

## 2024-01-14 NOTE — Assessment & Plan Note (Signed)
 Continue lisinopril  hct 40-25 mg and amlodipine  5 mg daily Reinforced DASH diet and exercise for weight loss CMET today

## 2024-01-14 NOTE — Patient Instructions (Signed)
 Managing Stress, Adult Feeling a certain amount of stress is normal. Stress helps our body and mind get ready to deal with the demands of life. Stress hormones can motivate you to do well at work and meet your responsibilities. But severe or long-term (chronic) stress can affect your mental and physical health. Chronic stress puts you at higher risk for: Anxiety and depression. Other health problems such as digestive problems, muscle aches, heart disease, high blood pressure, and stroke. What are the causes? Common causes of stress include: Demands from work, such as deadlines, feeling overworked, or having long hours. Pressures at home, such as money issues, disagreements with a spouse, or parenting issues. Pressures from major life changes, such as divorce, moving, loss of a loved one, or chronic illness. You may be at higher risk for stress-related problems if you: Do not get enough sleep. Are in poor health. Do not have emotional support. Have a mental health disorder such as anxiety or depression. How to recognize stress Stress can make you: Have trouble sleeping. Feel sad, anxious, irritable, or overwhelmed. Lose your appetite. Overeat or want to eat unhealthy foods. Want to use drugs or alcohol. Stress can also cause physical symptoms, such as: Sore, tense muscles, especially in the shoulders and neck. Headaches. Trouble breathing. A faster heart rate. Stomach pain, nausea, or vomiting. Diarrhea or constipation. Trouble concentrating. Follow these instructions at home: Eating and drinking Eat a healthy diet. This includes: Eating foods that are high in fiber, such as beans, whole grains, and fresh fruits and vegetables. Limiting foods that are high in fat and processed sugars, such as fried or sweet foods. Do not skip meals or overeat. Drink enough fluid to keep your urine pale yellow. Alcohol use Do not drink alcohol if: Your health care provider tells you not to  drink. You are pregnant, may be pregnant, or are planning to become pregnant. Drinking alcohol is a way some people try to ease their stress. This can be dangerous, so if you drink alcohol: Limit how much you have to: 0-1 drink a day for women. 0-2 drinks a day for men. Know how much alcohol is in your drink. In the [place] one drink equals one 12 oz bottle of beer (355 mL), one 5 oz glass of wine (148 mL), or one 1 oz glass of hard liquor (44 mL). Activity  Include 30 minutes of exercise in your daily schedule. Exercise is a good stress reducer. Include time in your day for an activity that you find relaxing. Try taking a walk, going on a bike ride, reading a book, or listening to music. Schedule your time in a way that lowers stress, and keep a regular schedule. Focus on doing what is most important to get done. Lifestyle Identify the source of your stress and your reaction to it. See a therapist who can help you change unhelpful reactions. When there are stressful events: Talk about them with family, friends, or coworkers. Try to think realistically about stressful events and not ignore them or overreact. Try to find the positives in a stressful situation and not focus on the negatives. Cut back on responsibilities at work and home, if possible. Ask for help from friends or family members if you need it. Find ways to manage stress, such as: Mindfulness, meditation, or deep breathing. Yoga or tai chi. Progressive muscle relaxation. Spending time in nature. Doing art, playing music, or reading. Making time for fun activities. Spending time with family and friends. Get support  from family, friends, or spiritual resources. General instructions Get enough sleep. Try to go to sleep and get up at about the same time every day. Take over-the-counter and prescription medicines only as told by your health care provider. Do not use any products that contain nicotine or tobacco. These products  include cigarettes, chewing tobacco, and vaping devices, such as e-cigarettes. If you need help quitting, ask your health care provider. Do not use drugs or smoke to deal with stress. Keep all follow-up visits. This is important. Where to find support Talk with your health care provider about stress management or finding a support group. Find a therapist to work with you on your stress management techniques. Where to find more information [employer] on Mental Illness: www.nami.org American Psychological Association: DiceTournament.ca Contact a health care provider if: Your stress symptoms get worse. You are unable to manage your stress at home. You are struggling to stop using drugs or alcohol. Get help right away if: You may be a danger to yourself or others. You have any thoughts of death or suicide. Get help right awayif you feel like you may hurt yourself or others, or have thoughts about taking your own life. Go to your nearest emergency room or: Call 911. Call the National Suicide Prevention Lifeline at (304)678-3299 or 988 in the [place] This is open 24 hours a day. If you're a Veteran: Call 988 and press 1. This is open 24 hours a day. Text the [employer] at 226-132-3352. Summary Feeling a certain amount of stress is normal, but severe or long-term (chronic) stress can affect your mental and physical health. Chronic stress can put you at higher risk for anxiety, depression, and other health problems such as digestive problems, muscle aches, heart disease, high blood pressure, and stroke. You may be at higher risk for stress-related problems if you do not get enough sleep, are in poor health, lack emotional support, or have a mental health disorder such as anxiety or depression. Identify the source of your stress and your reaction to it. Try talking about stressful events with family, friends, or coworkers, finding a coping method, or getting support from spiritual resources. If  you need more help, talk with your health care provider about finding a support group or a mental health therapist. This information is not intended to replace advice given to you by your health care provider. Make sure you discuss any questions you have with your health care provider. Document Revised: 01/16/2023 Document Reviewed: 12/26/2020 Elsevier Patient Education  2024 [employer].

## 2024-01-14 NOTE — Assessment & Plan Note (Signed)
Encourage low fodmap diet

## 2024-01-14 NOTE — Assessment & Plan Note (Signed)
 Encouraged diet and exercise for weight loss ?

## 2024-01-14 NOTE — Assessment & Plan Note (Signed)
 CMET and lipid profile today Encourage low fat diet She declines statin therapy at this time

## 2024-01-14 NOTE — Assessment & Plan Note (Signed)
A1C today Encouraged her to consume a low fat diet

## 2024-01-14 NOTE — Assessment & Plan Note (Signed)
 Deteriorated She does not want to take prescription medication at this time She is not interested in seeing a therapist Support offered

## 2024-01-14 NOTE — Progress Notes (Signed)
 Subjective:    Patient ID: Belinda Calderon, female    DOB: Feb 21, 1961, 63 y.o.   MRN: [id number]  HPI  Patient presents to clinic today for follow-up of chronic conditions.  HTN: Her BP today is 130/82.  She is taking lisinopril  HCT and amlodipine  as prescribed.  ECG from 06/2023 reviewed.  OA/fibromyalgia/ankylosing spondylitis: This is currently managed on hydrocodone .  She follows with rheumatology.  Anxiety/panic attacks and depression: Chronic but deteriorated lately due to family stress.  She is no longer taking clonazepam .  She does smoke THC to help her cope.  She is not currently seeing a therapist.  She denies SI/HI.  IBS: She reports alternating constipation and diarrhea.  She tries to control this with diet.  There is no colonoscopy on file.  HLD: Her last LDL was 108, triglycerides 292, 05/2023.  She is not taking any cholesterol-lowering medication at this time.  She tries to consume low-fat diet.  Prediabetes: Her last A1c was 5.9%, 05/2023.  She is not taking any oral diabetic medication at this time.  She does not check her sugars.  Review of Systems     Past Medical History:  Diagnosis Date   Ankylosing spondylitis (HCC)    Degenerative disc disease    Fibromyalgia    Hypertension    IBS (irritable bowel syndrome)     Current Outpatient Medications  Medication Sig Dispense Refill   amLODipine  (NORVASC ) 5 MG tablet TAKE 1 TABLET BY MOUTH EVERY DAY 30 tablet 0   cholecalciferol (VITAMIN D3) 25 MCG (1000 UNIT) tablet Take 1,000 Units by mouth daily.     HYDROcodone -acetaminophen  (NORCO) 10-325 MG tablet Take 1 tablet by mouth 2 (two) times daily. 60 tablet 0   lisinopril -hydrochlorothiazide  (ZESTORETIC ) 20-12.5 MG tablet TAKE 2 TABLETS BY MOUTH EVERY DAY 60 tablet 0   No current facility-administered medications for this visit.    Allergies  Allergen Reactions   Sulfa Antibiotics Hives and Swelling   Ibuprofen     GI IRRITATION    Family History   Problem Relation Age of Onset   Diabetes Mother    Arthritis Mother    Arthritis Father    Hyperlipidemia Father    Stroke Father    Hypertension Father    Diabetes Father    Arthritis Maternal Grandmother    Arthritis Maternal Grandfather    Arthritis Paternal Grandmother    Heart disease Paternal Grandmother    Stroke Paternal Grandmother    Hypertension Paternal Grandmother    Diabetes Paternal Grandmother    Arthritis Paternal Grandfather    Hypertension Paternal Grandfather    Diabetes Paternal Grandfather     Social History   Socioeconomic History   Marital status: Married    Spouse name: Ortha Metts   Number of children: 3   Years of education: Not on file   Highest education level: Not on file  Occupational History   Occupation: Disable  Tobacco Use   Smoking status: Never   Smokeless tobacco: Never  Vaping Use   Vaping status: Some Days   Substances: Mixture of cannabinoids  Substance and Sexual Activity   Alcohol use: Yes    Comment: rare   Drug use: Yes    Types: Marijuana    Comment: Cannabis oil   Sexual activity: Yes  Other Topics Concern   Not on file  Social History Narrative   Not on file   Social Drivers of Health   Financial Resource Strain: Low Risk  (01/24/2022)  Overall Financial Resource Strain (CARDIA)    Difficulty of Paying Living Expenses: Not hard at all  Food Insecurity: Patient Declined (05/27/2023)   Hunger Vital Sign    Worried About Running Out of Food in the Last Year: Patient declined    Ran Out of Food in the Last Year: Patient declined  Transportation Needs: Patient Declined (05/27/2023)   PRAPARE - Administrator, Civil Service (Medical): Patient declined    Lack of Transportation (Non-Medical): Patient declined  Physical Activity: Patient Declined (05/27/2023)   Exercise Vital Sign    Days of Exercise per Week: Patient declined    Minutes of Exercise per Session: Patient declined  Stress: Patient  Declined (05/27/2023)   [employer] of Occupational Health - Occupational Stress Questionnaire    Feeling of Stress : Patient declined  Social Connections: Patient Declined (05/27/2023)   Social Connection and Isolation Panel    Frequency of Communication with Friends and Family: Patient declined    Frequency of Social Gatherings with Friends and Family: Patient declined    Attends Religious Services: Patient declined    Database administrator or Organizations: Patient declined    Attends Banker Meetings: Patient declined    Marital Status: Patient declined  Intimate Partner Violence: Patient Declined (05/27/2023)   Humiliation, Afraid, Rape, and Kick questionnaire    Fear of Current or Ex-Partner: Patient declined    Emotionally Abused: Patient declined    Physically Abused: Patient declined    Sexually Abused: Patient declined     Constitutional: Denies fever, malaise, fatigue, headache or abrupt weight changes.  HEENT: Denies eye pain, eye redness, ear pain, ringing in the ears, wax buildup, runny nose, nasal congestion, bloody nose, or sore throat. Respiratory: Denies difficulty breathing, shortness of breath, cough or sputum production.   Cardiovascular: Denies chest pain, chest tightness, palpitations or swelling in the hands or feet.  Gastrointestinal: Patient reports alternating constipation and diarrhea.  Denies abdominal pain, bloating, or blood in the stool.  GU: Denies urgency, frequency, pain with urination, burning sensation, blood in urine, odor or discharge. Musculoskeletal: Patient reports chronic joint and muscle pain.  Denies decrease in range of motion, difficulty with gait, or joint swelling.  Skin: Pt reports cyst of back, mole on left leg. Denies redness, rashes, or ulcercations.  Neurological: Denies dizziness, difficulty with memory, difficulty with speech or problems with balance and coordination.  Psych: Patient has a history of anxiety and  depression.  Denies SI/HI.  No other specific complaints in a complete review of systems (except as listed in HPI above).  Objective:   Physical Exam  BP 130/82 (BP Location: Right Arm, Patient Position: Sitting, Cuff Size: Normal)   Ht 5' 7 (1.702 m)   Wt 196 lb 3.2 oz (89 kg)   LMP  (LMP Unknown)   BMI 30.73 kg/m    Wt Readings from Last 3 Encounters:  06/24/23 207 lb (93.9 kg)  05/27/23 214 lb (97.1 kg)  04/21/23 212 lb (96.2 kg)    General: Appears her stated age, obese, in NAD. Skin: Warm, dry and intact. Sebaceous cyst noted of central midline back. 3 mm purple papule noted of left lateral knee. Cardiovascular: Normal rate and rhythm. S1,S2 noted.  No murmur, rubs or gallops noted. No JVD or BLE edema.  Pulmonary/Chest: Normal effort and positive vesicular breath sounds. No respiratory distress. No wheezes, rales or ronchi noted.  Musculoskeletal: Pain with palpation over the spine. Multiple tender points noted of  upper and lower extremities, chest and back. Gait slow and steady without device.  Neurological: Alert and oriented. Coordination normal.  Psychiatric: Mood and affect normal. Very anxious appearing today. Judgment and thought content normal.     BMET    Component Value Date/Time   NA 138 05/27/2023 0948   K 4.1 05/27/2023 0948   CL 104 05/27/2023 0948   CO2 24 05/27/2023 0948   GLUCOSE 105 (H) 05/27/2023 0948   BUN 16 05/27/2023 0948   CREATININE 0.67 05/27/2023 0948   CALCIUM 9.7 05/27/2023 0948   GFRNONAA 96 12/11/2020 0844   GFRAA 112 12/11/2020 0844    Lipid Panel     Component Value Date/Time   CHOL 189 05/27/2023 0948   TRIG 292 (H) 05/27/2023 0948   HDL 39 (L) 05/27/2023 0948   CHOLHDL 4.8 05/27/2023 0948   VLDL 63.0 (H) 12/13/2019 1254   LDLCALC 108 (H) 05/27/2023 0948    CBC    Component Value Date/Time   WBC 9.5 05/27/2023 0948   RBC 4.59 05/27/2023 0948   HGB 14.1 05/27/2023 0948   HCT 41.4 05/27/2023 0948   PLT 314  05/27/2023 0948   MCV 90.2 05/27/2023 0948   MCH 30.7 05/27/2023 0948   MCHC 34.1 05/27/2023 0948   RDW 12.6 05/27/2023 0948   LYMPHSABS 2.7 09/09/2011 2110   MONOABS 0.9 09/09/2011 2110   EOSABS 1.0 (H) 09/09/2011 2110   BASOSABS 0.0 09/09/2011 2110    Hgb A1C Lab Results  Component Value Date   HGBA1C 5.9 (H) 05/27/2023            Assessment & Plan:      RTC in 6 months for your annual exam [name] Laura, NP

## 2024-01-15 ENCOUNTER — Ambulatory Visit: Payer: Self-pay | Admitting: Internal Medicine

## 2024-01-15 LAB — HEMOGLOBIN A1C
Hgb A1c MFr Bld: 5.8 % — ABNORMAL HIGH (ref <5.7)
Mean Plasma Glucose: 120 mg/dL
eAG (mmol/L): 6.6 mmol/L

## 2024-01-15 LAB — COMPREHENSIVE METABOLIC PANEL WITH GFR
AG Ratio: 1.9 (calc) (ref 1.0–2.5)
ALT: 19 U/L (ref 6–29)
AST: 19 U/L (ref 10–35)
Albumin: 4.6 g/dL (ref 3.6–5.1)
Alkaline phosphatase (APISO): 114 U/L (ref 37–153)
BUN: 14 mg/dL (ref 7–25)
CO2: 26 mmol/L (ref 20–32)
Calcium: 9.9 mg/dL (ref 8.6–10.4)
Chloride: 102 mmol/L (ref 98–110)
Creat: 0.62 mg/dL (ref 0.50–1.05)
Globulin: 2.4 g/dL (ref 1.9–3.7)
Glucose, Bld: 118 mg/dL (ref 65–139)
Potassium: 4 mmol/L (ref 3.5–5.3)
Sodium: 137 mmol/L (ref 135–146)
Total Bilirubin: 0.4 mg/dL (ref 0.2–1.2)
Total Protein: 7 g/dL (ref 6.1–8.1)
eGFR: 101 mL/min/1.73m2 (ref >=60)

## 2024-01-15 LAB — LIPID PANEL
Cholesterol: 185 mg/dL (ref <200)
HDL: 35 mg/dL — ABNORMAL LOW (ref >=50)
LDL Cholesterol (Calc): 109 mg/dL — ABNORMAL HIGH
Non-HDL Cholesterol (Calc): 150 mg/dL — ABNORMAL HIGH (ref <130)
Total CHOL/HDL Ratio: 5.3 (calc) — ABNORMAL HIGH (ref <5.0)
Triglycerides: 307 mg/dL — ABNORMAL HIGH (ref <150)

## 2024-01-15 LAB — CBC
HCT: 42 % (ref 35.0–45.0)
Hemoglobin: 13.8 g/dL (ref 11.7–15.5)
MCH: 30.2 pg (ref 27.0–33.0)
MCHC: 32.9 g/dL (ref 32.0–36.0)
MCV: 91.9 fL (ref 80.0–100.0)
MPV: 10.5 fL (ref 7.5–12.5)
Platelets: 286 Thousand/uL (ref 140–400)
RBC: 4.57 Million/uL (ref 3.80–5.10)
RDW: 12.9 % (ref 11.0–15.0)
WBC: 9.2 Thousand/uL (ref 3.8–10.8)

## 2024-01-24 ENCOUNTER — Other Ambulatory Visit: Payer: Self-pay | Admitting: Internal Medicine

## 2024-01-27 NOTE — Telephone Encounter (Signed)
 Requested Prescriptions  Pending Prescriptions Disp Refills   lisinopril -hydrochlorothiazide  (ZESTORETIC ) 20-12.5 MG tablet [Pharmacy Med Name: LISINOPRIL -HCTZ 20-12.5 MG TAB] 180 tablet 1    Sig: TAKE 2 TABLETS BY MOUTH EVERY DAY     Cardiovascular:  ACEI + Diuretic Combos Failed - 01/27/2024  4:08 PM      Failed - Valid encounter within last 6 months    Recent Outpatient Visits           1 week ago Primary hypertension   [facility] [place], [place] W, NP              Passed - Na in normal range and within 180 days    Sodium  Date Value Ref Range Status  01/14/2024 137 135 - 146 mmol/L Final         Passed - K in normal range and within 180 days    Potassium  Date Value Ref Range Status  01/14/2024 4.0 3.5 - 5.3 mmol/L Final         Passed - Cr in normal range and within 180 days    Creat  Date Value Ref Range Status  01/14/2024 0.62 0.50 - 1.05 mg/dL Final         Passed - eGFR is 30 or above and within 180 days    GFR, Est African American  Date Value Ref Range Status  12/11/2020 112 > OR = 60 mL/min/1.28m2 Final   GFR, Est Non African American  Date Value Ref Range Status  12/11/2020 96 > OR = 60 mL/min/1.12m2 Final   GFR  Date Value Ref Range Status  07/26/2020 93.23 >60.00 mL/min Final    Comment:    Calculated using the CKD-EPI Creatinine Equation (2021)   eGFR  Date Value Ref Range Status  01/14/2024 101 > OR = 60 mL/min/1.67m2 Final         Passed - Patient is not pregnant      Passed - Last BP in normal range    BP Readings from Last 1 Encounters:  01/14/24 130/82          amLODipine  (NORVASC ) 5 MG tablet [Pharmacy Med Name: AMLODIPINE  BESYLATE 5 MG TAB] 90 tablet 1    Sig: TAKE 1 TABLET BY MOUTH EVERY DAY     Cardiovascular: Calcium Channel Blockers 2 Failed - 01/27/2024  4:08 PM      Failed - Valid encounter within last 6 months    Recent Outpatient Visits           1 week ago Primary hypertension   [facility] [place], [place] W, NP              Passed - Last BP in normal range    BP Readings from Last 1 Encounters:  01/14/24 130/82         Passed - Last Heart Rate in normal range    Pulse Readings from Last 1 Encounters:  04/21/23 78

## 2024-02-04 ENCOUNTER — Ambulatory Visit (INDEPENDENT_AMBULATORY_CARE_PROVIDER_SITE_OTHER): Admitting: Internal Medicine

## 2024-02-04 ENCOUNTER — Encounter: Payer: Self-pay | Admitting: Internal Medicine

## 2024-02-04 VITALS — BP 128/84 | Ht 67.0 in | Wt 196.6 lb

## 2024-02-04 DIAGNOSIS — M456 Ankylosing spondylitis lumbar region: Secondary | ICD-10-CM | POA: Diagnosis not present

## 2024-02-04 DIAGNOSIS — M797 Fibromyalgia: Secondary | ICD-10-CM

## 2024-02-04 DIAGNOSIS — Z0289 Encounter for other administrative examinations: Secondary | ICD-10-CM | POA: Diagnosis not present

## 2024-02-04 NOTE — Progress Notes (Signed)
 Subjective:    Patient ID: Belinda Calderon, female    DOB: 1960/12/09, 63 y.o.   MRN: [id number]  HPI  Pt presents to the clinic today for form completion. She has a handicap placard that she needs filled out. She uses this for chronic muscle and joint pain, related to her fibromyalgia and akylosing spondylitis.  Review of Systems     Past Medical History:  Diagnosis Date   Ankylosing spondylitis (HCC)    Degenerative disc disease    Fibromyalgia    Hypertension    IBS (irritable bowel syndrome)     Current Outpatient Medications  Medication Sig Dispense Refill   amLODipine  (NORVASC ) 5 MG tablet TAKE 1 TABLET BY MOUTH EVERY DAY 90 tablet 1   cholecalciferol (VITAMIN D3) 25 MCG (1000 UNIT) tablet Take 1,000 Units by mouth daily.     HYDROcodone -acetaminophen  (NORCO) 10-325 MG tablet Take 1 tablet by mouth 2 (two) times daily. 60 tablet 0   lisinopril -hydrochlorothiazide  (ZESTORETIC ) 20-12.5 MG tablet TAKE 2 TABLETS BY MOUTH EVERY DAY 180 tablet 1   No current facility-administered medications for this visit.    Allergies  Allergen Reactions   Sulfa Antibiotics Hives and Swelling   Ibuprofen     GI IRRITATION    Family History  Problem Relation Age of Onset   Diabetes Mother    Arthritis Mother    Arthritis Father    Hyperlipidemia Father    Stroke Father    Hypertension Father    Diabetes Father    Arthritis Maternal Grandmother    Arthritis Maternal Grandfather    Arthritis Paternal Grandmother    Heart disease Paternal Grandmother    Stroke Paternal Grandmother    Hypertension Paternal Grandmother    Diabetes Paternal Grandmother    Arthritis Paternal Grandfather    Hypertension Paternal Grandfather    Diabetes Paternal Grandfather     Social History   Socioeconomic History   Marital status: Married    Spouse name: Bryanah Sidell   Number of children: 3   Years of education: Not on file   Highest education level: Not on file  Occupational History    Occupation: Disable  Tobacco Use   Smoking status: Never   Smokeless tobacco: Never  Vaping Use   Vaping status: Some Days   Substances: Mixture of cannabinoids  Substance and Sexual Activity   Alcohol use: Yes    Comment: rare   Drug use: Yes    Types: Marijuana    Comment: Cannabis oil   Sexual activity: Yes  Other Topics Concern   Not on file  Social History Narrative   Not on file   Social Drivers of Health   Financial Resource Strain: Low Risk  (01/24/2022)   Overall Financial Resource Strain (CARDIA)    Difficulty of Paying Living Expenses: Not hard at all  Food Insecurity: Patient Declined (05/27/2023)   Hunger Vital Sign    Worried About Running Out of Food in the Last Year: Patient declined    Ran Out of Food in the Last Year: Patient declined  Transportation Needs: Patient Declined (05/27/2023)   PRAPARE - Administrator, Civil Service (Medical): Patient declined    Lack of Transportation (Non-Medical): Patient declined  Physical Activity: Patient Declined (05/27/2023)   Exercise Vital Sign    Days of Exercise per Week: Patient declined    Minutes of Exercise per Session: Patient declined  Stress: Patient Declined (05/27/2023)   [employer] of Occupational Health -  Occupational Stress Questionnaire    Feeling of Stress : Patient declined  Social Connections: Patient Declined (05/27/2023)   Social Connection and Isolation Panel    Frequency of Communication with Friends and Family: Patient declined    Frequency of Social Gatherings with Friends and Family: Patient declined    Attends Religious Services: Patient declined    Database administrator or Organizations: Patient declined    Attends Banker Meetings: Patient declined    Marital Status: Patient declined  Intimate Partner Violence: Patient Declined (05/27/2023)   Humiliation, Afraid, Rape, and Kick questionnaire    Fear of Current or Ex-Partner: Patient declined     Emotionally Abused: Patient declined    Physically Abused: Patient declined    Sexually Abused: Patient declined     Constitutional: Denies fever, malaise, fatigue, headache or abrupt weight changes.  HEENT: Denies eye pain, eye redness, ear pain, ringing in the ears, wax buildup, runny nose, nasal congestion, bloody nose, or sore throat. Respiratory: Denies difficulty breathing, shortness of breath, cough or sputum production.   Cardiovascular: Denies chest pain, chest tightness, palpitations or swelling in the hands or feet.  Gastrointestinal: Patient reports alternating constipation and diarrhea.  Denies abdominal pain, bloating, or blood in the stool.  GU: Denies urgency, frequency, pain with urination, burning sensation, blood in urine, odor or discharge. Musculoskeletal: Patient reports chronic joint and muscle pain.  Denies decrease in range of motion, difficulty with gait, or joint swelling.  Skin: Pt reports cyst of back, mole on left leg. Denies redness, rashes, or ulcercations.  Neurological: Denies dizziness, difficulty with memory, difficulty with speech or problems with balance and coordination.  Psych: Patient has a history of anxiety and depression.  Denies SI/HI.  No other specific complaints in a complete review of systems (except as listed in HPI above).  Objective:   Physical Exam  BP 128/84 (BP Location: Right Arm, Patient Position: Sitting, Cuff Size: Normal)   Ht 5' 7 (1.702 m)   Wt 196 lb 9.6 oz (89.2 kg)   LMP  (LMP Unknown)   BMI 30.79 kg/m     Wt Readings from Last 3 Encounters:  01/14/24 196 lb 3.2 oz (89 kg)  06/24/23 207 lb (93.9 kg)  05/27/23 214 lb (97.1 kg)    General: Appears her stated age, obese, in NAD. Cardiovascular: Normal rate and rhythm.  Pulmonary/Chest: Normal effort and positive vesicular breath sounds. No respiratory distress. No wheezes, rales or ronchi noted.  Musculoskeletal: Pain with palpation over the spine. Multiple tender  points noted of upper and lower extremities, chest and back. Gait slow and steady without device.  Neurological: Alert and oriented.    BMET    Component Value Date/Time   NA 137 01/14/2024 1036   K 4.0 01/14/2024 1036   CL 102 01/14/2024 1036   CO2 26 01/14/2024 1036   GLUCOSE 118 01/14/2024 1036   BUN 14 01/14/2024 1036   CREATININE 0.62 01/14/2024 1036   CALCIUM 9.9 01/14/2024 1036   GFRNONAA 96 12/11/2020 0844   GFRAA 112 12/11/2020 0844    Lipid Panel     Component Value Date/Time   CHOL 185 01/14/2024 1036   TRIG 307 (H) 01/14/2024 1036   HDL 35 (L) 01/14/2024 1036   CHOLHDL 5.3 (H) 01/14/2024 1036   VLDL 63.0 (H) 12/13/2019 1254   LDLCALC 109 (H) 01/14/2024 1036    CBC    Component Value Date/Time   WBC 9.2 01/14/2024 1036   RBC 4.57  01/14/2024 1036   HGB 13.8 01/14/2024 1036   HCT 42.0 01/14/2024 1036   PLT 286 01/14/2024 1036   MCV 91.9 01/14/2024 1036   MCH 30.2 01/14/2024 1036   MCHC 32.9 01/14/2024 1036   RDW 12.9 01/14/2024 1036   LYMPHSABS 2.7 09/09/2011 2110   MONOABS 0.9 09/09/2011 2110   EOSABS 1.0 (H) 09/09/2011 2110   BASOSABS 0.0 09/09/2011 2110    Hgb A1C Lab Results  Component Value Date   HGBA1C 5.8 (H) 01/14/2024            Assessment & Plan:   Encounter for form completion, fibromyalgia, ankylosing spondylitis:  Handicap placard filled out and returned to patient   RTC in 5 months for your annual exam [name] Laura, NP

## 2024-02-06 ENCOUNTER — Other Ambulatory Visit: Payer: Self-pay | Admitting: Internal Medicine

## 2024-02-06 NOTE — Telephone Encounter (Signed)
 Copied from CRM [id number]. Topic: Clinical - Medication Refill >> Feb 06, 2024 10:42 AM Jasmin G wrote: Medication:  HYDROcodone -acetaminophen  (NORCO) 10-325 MG tablet  Has the patient contacted their pharmacy? No (Agent: If no, request that the patient contact the pharmacy for the refill. If patient does not wish to contact the pharmacy document the reason why and proceed with request.) (Agent: If yes, when and what did the pharmacy advise?)  This is the patient's preferred pharmacy:  [phone] [place] - [address] [place] [address] Phone: [phone] Fax: [phone]  Is this the correct pharmacy for this prescription? Yes If no, delete pharmacy and type the correct one.   Has the prescription been filled recently? Yes  Is the patient out of the medication? No  Has the patient been seen for an appointment in the last year OR does the patient have an upcoming appointment? Yes  Can we respond through MyChart? No  Agent: Please be advised that Rx refills may take up to 3 business days. We ask that you follow-up with your pharmacy.

## 2024-02-09 MED ORDER — HYDROCODONE-ACETAMINOPHEN 10-325 MG PO TABS: 1.0000 | ORAL_TABLET | Freq: Two times a day (BID) | ORAL | 0 refills | Status: DC

## 2024-02-09 NOTE — Telephone Encounter (Signed)
 Requested medication (s) are due for refill today: yes  Requested medication (s) are on the active medication list: yes  Last refill:  12/30/23  Future visit scheduled: yes  Notes to clinic:  Unable to refill per protocol, cannot delegate.      Requested Prescriptions  Pending Prescriptions Disp Refills   HYDROcodone -acetaminophen  (NORCO) 10-325 MG tablet 60 tablet 0    Sig: Take 1 tablet by mouth 2 (two) times daily.     Not Delegated - Analgesics:  Opioid Agonist Combinations Failed - 02/09/2024  9:25 AM      Failed - This refill cannot be delegated      Failed - Urine Drug Screen completed in last 360 days      Passed - Valid encounter within last 3 months    Recent Outpatient Visits           5 days ago Fibromyalgia   [place] [facility] [place], [name] ORN, NP   3 weeks ago Primary hypertension   [place] [facility] [place], [name] ORN, [place]

## 2024-03-15 ENCOUNTER — Other Ambulatory Visit: Payer: Self-pay | Admitting: Internal Medicine

## 2024-03-15 MED ORDER — HYDROCODONE-ACETAMINOPHEN 10-325 MG PO TABS: 1.0000 | ORAL_TABLET | Freq: Two times a day (BID) | ORAL | 0 refills | Status: DC

## 2024-03-15 NOTE — Telephone Encounter (Signed)
 Copied from CRM #[id number]. Topic: Clinical - Medication Refill >> Mar 15, 2024  9:16 AM Gustabo D wrote: Medication: HYDROcodone -acetaminophen  (NORCO) 10-325 MG tablet     Has the patient contacted their pharmacy? No (Agent: If no, request that the patient contact the pharmacy for the refill. If patient does not wish to contact the pharmacy document the reason why and proceed with request.) (Agent: If yes, when and what did the pharmacy advise?)  This is the patient's preferred pharmacy:  [phone] [facility] [place] - [address] [place] [address] Phone: [phone] Fax: [phone]  Is this the correct pharmacy for this prescription? Yes If no, delete pharmacy and type the correct one.   Has the prescription been filled recently? No  Is the patient out of the medication? Yes  Has the patient been seen for an appointment in the last year OR does the patient have an upcoming appointment? Yes  Can we respond through MyChart? No  Agent: Please be advised that Rx refills may take up to 3 business days. We ask that you follow-up with your pharmacy.

## 2024-03-17 DIAGNOSIS — M170 Bilateral primary osteoarthritis of knee: Secondary | ICD-10-CM | POA: Diagnosis not present

## 2024-03-17 DIAGNOSIS — M25561 Pain in right knee: Secondary | ICD-10-CM | POA: Diagnosis not present

## 2024-03-17 DIAGNOSIS — M25562 Pain in left knee: Secondary | ICD-10-CM | POA: Diagnosis not present

## 2024-04-06 ENCOUNTER — Ambulatory Visit: Admitting: Internal Medicine

## 2024-04-13 ENCOUNTER — Other Ambulatory Visit: Payer: Self-pay | Admitting: Internal Medicine

## 2024-04-13 NOTE — Telephone Encounter (Unsigned)
 Copied from CRM [id number]. Topic: Clinical - Medication Refill >> Apr 13, 2024  9:14 AM Berwyn MATSU wrote: Medication:  HYDROcodone -acetaminophen  (NORCO) 10-325 MG tablet  Has the patient contacted their pharmacy? Yes (Agent: If no, request that the patient contact the pharmacy for the refill. If patient does not wish to contact the pharmacy document the reason why and proceed with request.) (Agent: If yes, when and what did the pharmacy advise?)  This is the patient's preferred pharmacy:  [phone] [facility] [place] - [address] [place] [address] Phone: [phone] Fax: [phone]    Is this the correct pharmacy for this prescription? Yes If no, delete pharmacy and type the correct one.   Has the prescription been filled recently? Yes  Is the patient out of the medication? No  Has the patient been seen for an appointment in the last year OR does the patient have an upcoming appointment? Yes  Can we respond through MyChart? No  Agent: Please be advised that Rx refills may take up to 3 business days. We ask that you follow-up with your pharmacy.

## 2024-04-14 NOTE — Telephone Encounter (Signed)
 Requested medication (s) are due for refill today: yes  Requested medication (s) are on the active medication list: yes  Last refill:  03/15/24  Future visit scheduled: yes  Notes to clinic:  Unable to refill per protocol, cannot delegate.      Requested Prescriptions  Pending Prescriptions Disp Refills   HYDROcodone -acetaminophen  (NORCO) 10-325 MG tablet 60 tablet 0    Sig: Take 1 tablet by mouth 2 (two) times daily.     Not Delegated - Analgesics:  Opioid Agonist Combinations Failed - 04/14/2024  4:05 PM      Failed - This refill cannot be delegated      Failed - Urine Drug Screen completed in last 360 days      Passed - Valid encounter within last 3 months    Recent Outpatient Visits           2 months ago Fibromyalgia   [place] [facility] [place], [name] ORN, NP   3 months ago Primary hypertension   [place] [facility] [place], [name] ORN, [place]

## 2024-04-15 MED ORDER — HYDROCODONE-ACETAMINOPHEN 10-325 MG PO TABS: 1.0000 | ORAL_TABLET | Freq: Two times a day (BID) | ORAL | 0 refills | Status: DC

## 2024-05-17 ENCOUNTER — Telehealth: Payer: Self-pay | Admitting: Internal Medicine

## 2024-05-17 NOTE — Telephone Encounter (Unsigned)
 Copied from CRM [id number]. Topic: Clinical - Medication Refill >> May 17, 2024  9:59 AM Antwanette L wrote: Medication: HYDROcodone -acetaminophen  (NORCO) 10-325 MG tablet  Has the patient contacted their pharmacy? No   This is the patient's preferred pharmacy:  [phone] [facility] [place] - [address] [place] [address] Phone: [phone] Fax: [phone]    Is this the correct pharmacy for this prescription? Yes   Has the prescription been filled recently? Yes. Last refill was on 04/15/24  Is the patient out of the medication? Yes  Has the patient been seen for an appointment in the last year OR does the patient have an upcoming appointment? Yes. Last ov with [name] Laura was on 02/04/24 and next appt is 06/03/24  Can we respond through MyChart? No. Contact the patient by phone at [phone]  Agent: Please be advised that Rx refills may take up to 3 business days. We ask that you follow-up with your pharmacy.

## 2024-05-19 NOTE — Telephone Encounter (Signed)
 Pt called to check the status of her refill request, states she is completely out of her current supply.

## 2024-05-19 NOTE — Telephone Encounter (Signed)
 Patient called in to check on the status of this medication being sent to pharmacy for her?Patient that she is in a lot of pain and really needs this medication before the possible bad weather that is supposed to be coming.

## 2024-05-20 MED ORDER — HYDROCODONE-ACETAMINOPHEN 10-325 MG PO TABS: 1.0000 | ORAL_TABLET | Freq: Two times a day (BID) | ORAL | 0 refills | Status: DC

## 2024-06-03 ENCOUNTER — Encounter: Admitting: Internal Medicine

## 2024-06-04 ENCOUNTER — Encounter: Payer: Self-pay | Admitting: Pharmacist

## 2024-06-04 NOTE — Progress Notes (Signed)
" ° °  06/04/2024  Patient ID: Belinda Calderon, female   DOB: 04-27-1961, 63 y.o.   MRN: [id number]  This patient is appearing on a report for the adherence measure for hypertension (ACEi/ARB) medications this calendar year.   Medication: lisinopril -HCTZ 20-12.5 mg Last fill date: 05/09/2024 for 90 day supply  BP Readings from Last 3 Encounters:  02/04/24 128/84  01/14/24 130/82  06/24/23 134/82   Pulse Readings from Last 3 Encounters:  04/21/23 78  02/27/23 72  12/03/22 81     Insurance report was not up to date. No action needed at this time.   Sharyle Sia, PharmD, [facility] [phone]   "

## 2024-06-18 ENCOUNTER — Other Ambulatory Visit: Payer: Self-pay | Admitting: Internal Medicine

## 2024-06-18 MED ORDER — HYDROCODONE-ACETAMINOPHEN 10-325 MG PO TABS: 1.0000 | ORAL_TABLET | Freq: Two times a day (BID) | ORAL | 0 refills | Status: DC

## 2024-06-18 NOTE — Telephone Encounter (Signed)
 Copied from CRM #[id number]. Topic: Clinical - Medication Refill >> Jun 18, 2024  8:46 AM Rosaria BRAVO wrote: Medication: HYDROcodone -acetaminophen  (NORCO) 10-325 MG tablet  Has the patient contacted their pharmacy? Yes (Agent: If no, request that the patient contact the pharmacy for the refill. If patient does not wish to contact the pharmacy document the reason why and proceed with request.) (Agent: If yes, when and what did the pharmacy advise?)  This is the patient's preferred pharmacy:  [phone] [place] - [address] [place] [address] Phone: [phone] Fax: [phone]  Is this the correct pharmacy for this prescription? Yes If no, delete pharmacy and type the correct one.   Has the prescription been filled recently? Yes  Is the patient out of the medication? Yes. Will be out on the 4th, states that she does not want to wait a whole week. She wants it called in.   Has the patient been seen for an appointment in the last year OR does the patient have an upcoming appointment? Yes  Can we respond through MyChart? Yes  Agent: Please be advised that Rx refills may take up to 3 business days. We ask that you follow-up with your pharmacy.

## 2024-06-18 NOTE — Telephone Encounter (Signed)
 Requested medications are due for refill today.  yes  Requested medications are on the active medications list.  yes  Last refill. 05/20/2024 #60 0 rf  Future visit scheduled.   no  Notes to clinic.  Refill not delegated.    Requested Prescriptions  Pending Prescriptions Disp Refills   HYDROcodone -acetaminophen  (NORCO) 10-325 MG tablet 60 tablet 0    Sig: Take 1 tablet by mouth 2 (two) times daily.     Not Delegated - Analgesics:  Opioid Agonist Combinations Failed - 06/18/2024  2:43 PM      Failed - This refill cannot be delegated      Failed - Urine Drug Screen completed in last 360 days      Failed - Valid encounter within last 3 months    Recent Outpatient Visits           4 months ago Fibromyalgia   [place] [facility] [place], [name] ORN, NP   5 months ago Primary hypertension   [place] [facility] [place], [name] ORN, [place]

## 2024-07-16 ENCOUNTER — Other Ambulatory Visit: Payer: Self-pay | Admitting: Internal Medicine

## 2024-07-16 NOTE — Telephone Encounter (Unsigned)
 Copied from CRM #[id number]. Topic: Clinical - Medication Refill >> Jul 16, 2024 10:02 AM Montie POUR wrote: Medication:  HYDROcodone -acetaminophen  (NORCO) 10-325 MG tablet  Has the patient contacted their pharmacy? Yes (Agent: If no, request that the patient contact the pharmacy for the refill. If patient does not wish to contact the pharmacy document the reason why and proceed with request.) (Agent: If yes, when and what did the pharmacy advise?) Pharmacy needs order to refill  This is the patient's preferred pharmacy[facility]  [facility] [address] [place] - [address] [place] [address] Phone: [phone] Fax: [phone]   Is this the correct pharmacy for this prescription? Yes If no, delete pharmacy and type the correct one.   Has the prescription been filled recently? No  Is the patient out of the medication? No  Has the patient been seen for an appointment in the last year OR does the patient have an upcoming appointment? Yes  Can we respond through MyChart? Yes  Agent: Please be advised that Rx refills may take up to 3 business days. We ask that you follow-up with your pharmacy.

## 2024-07-19 MED ORDER — HYDROCODONE-ACETAMINOPHEN 10-325 MG PO TABS: 1.0000 | ORAL_TABLET | Freq: Two times a day (BID) | ORAL | 0 refills | Status: AC

## 2024-08-03 ENCOUNTER — Encounter: Admitting: Internal Medicine
